# Patient Record
Sex: Female | Born: 1950 | Race: White | Hispanic: No | Marital: Married | Smoking: Former smoker
Health system: Southern US, Community
[De-identification: ages and names within clinical notes are randomized; demographics above are authoritative.]

## PROBLEM LIST (undated history)

## (undated) DIAGNOSIS — C069 Malignant neoplasm of mouth, unspecified: Secondary | ICD-10-CM

## (undated) DIAGNOSIS — M8580 Other specified disorders of bone density and structure, unspecified site: Secondary | ICD-10-CM

## (undated) DIAGNOSIS — R079 Chest pain, unspecified: Secondary | ICD-10-CM

## (undated) DIAGNOSIS — K5120 Ulcerative (chronic) proctitis without complications: Secondary | ICD-10-CM

## (undated) DIAGNOSIS — K219 Gastro-esophageal reflux disease without esophagitis: Secondary | ICD-10-CM

## (undated) DIAGNOSIS — C669 Malignant neoplasm of unspecified ureter: Secondary | ICD-10-CM

## (undated) DIAGNOSIS — M069 Rheumatoid arthritis, unspecified: Secondary | ICD-10-CM

## (undated) DIAGNOSIS — M0520 Rheumatoid vasculitis with rheumatoid arthritis of unspecified site: Secondary | ICD-10-CM

## (undated) DIAGNOSIS — K1379 Other lesions of oral mucosa: Secondary | ICD-10-CM

## (undated) DIAGNOSIS — Z973 Presence of spectacles and contact lenses: Secondary | ICD-10-CM

## (undated) DIAGNOSIS — Z9889 Other specified postprocedural states: Secondary | ICD-10-CM

## (undated) DIAGNOSIS — R112 Nausea with vomiting, unspecified: Secondary | ICD-10-CM

## (undated) HISTORY — PX: TUBAL LIGATION: SHX77

## (undated) HISTORY — PX: COLONOSCOPY: SHX174

## (undated) HISTORY — DX: Chest pain, unspecified: R07.9

---

## 1986-11-01 DIAGNOSIS — R112 Nausea with vomiting, unspecified: Secondary | ICD-10-CM

## 1986-11-01 DIAGNOSIS — Z9889 Other specified postprocedural states: Secondary | ICD-10-CM

## 1986-11-01 HISTORY — DX: Nausea with vomiting, unspecified: R11.2

## 1986-11-01 HISTORY — DX: Other specified postprocedural states: Z98.890

## 2000-05-10 ENCOUNTER — Other Ambulatory Visit: Admission: RE | Admit: 2000-05-10 | Discharge: 2000-05-10 | Payer: Self-pay | Admitting: Obstetrics and Gynecology

## 2000-07-27 ENCOUNTER — Encounter: Payer: Self-pay | Admitting: *Deleted

## 2000-07-27 ENCOUNTER — Encounter: Admission: RE | Admit: 2000-07-27 | Discharge: 2000-07-27 | Payer: Self-pay | Admitting: *Deleted

## 2002-02-19 ENCOUNTER — Other Ambulatory Visit: Admission: RE | Admit: 2002-02-19 | Discharge: 2002-02-19 | Payer: Self-pay | Admitting: Obstetrics and Gynecology

## 2002-07-25 ENCOUNTER — Ambulatory Visit (HOSPITAL_COMMUNITY): Admission: RE | Admit: 2002-07-25 | Discharge: 2002-07-25 | Payer: Self-pay | Admitting: Gastroenterology

## 2002-07-25 ENCOUNTER — Encounter (INDEPENDENT_AMBULATORY_CARE_PROVIDER_SITE_OTHER): Payer: Self-pay | Admitting: Specialist

## 2003-07-12 ENCOUNTER — Other Ambulatory Visit: Admission: RE | Admit: 2003-07-12 | Discharge: 2003-07-12 | Payer: Self-pay | Admitting: Obstetrics and Gynecology

## 2003-08-13 ENCOUNTER — Encounter: Payer: Self-pay | Admitting: Obstetrics and Gynecology

## 2003-08-13 ENCOUNTER — Encounter: Admission: RE | Admit: 2003-08-13 | Discharge: 2003-08-13 | Payer: Self-pay | Admitting: Obstetrics and Gynecology

## 2004-07-15 ENCOUNTER — Other Ambulatory Visit: Admission: RE | Admit: 2004-07-15 | Discharge: 2004-07-15 | Payer: Self-pay | Admitting: Obstetrics and Gynecology

## 2005-11-01 HISTORY — PX: FINGER GANGLION CYST EXCISION: SHX1636

## 2006-02-22 ENCOUNTER — Ambulatory Visit (HOSPITAL_BASED_OUTPATIENT_CLINIC_OR_DEPARTMENT_OTHER): Admission: RE | Admit: 2006-02-22 | Discharge: 2006-02-22 | Payer: Self-pay | Admitting: Orthopedic Surgery

## 2006-07-25 ENCOUNTER — Other Ambulatory Visit: Admission: RE | Admit: 2006-07-25 | Discharge: 2006-07-25 | Payer: Self-pay | Admitting: Obstetrics and Gynecology

## 2007-08-04 ENCOUNTER — Ambulatory Visit (HOSPITAL_COMMUNITY): Admission: RE | Admit: 2007-08-04 | Discharge: 2007-08-04 | Payer: Self-pay | Admitting: Obstetrics and Gynecology

## 2011-01-04 ENCOUNTER — Other Ambulatory Visit: Payer: Self-pay | Admitting: Rheumatology

## 2011-01-04 DIAGNOSIS — M545 Low back pain: Secondary | ICD-10-CM

## 2011-01-04 DIAGNOSIS — M79604 Pain in right leg: Secondary | ICD-10-CM

## 2011-01-07 ENCOUNTER — Other Ambulatory Visit: Payer: Self-pay

## 2011-01-25 ENCOUNTER — Ambulatory Visit
Admission: RE | Admit: 2011-01-25 | Discharge: 2011-01-25 | Disposition: A | Discharge status: Home / Self Care | Payer: Self-pay | Source: Ambulatory Visit | Attending: Rheumatology | Admitting: Rheumatology

## 2011-01-25 DIAGNOSIS — M545 Low back pain: Secondary | ICD-10-CM

## 2011-01-25 DIAGNOSIS — M79604 Pain in right leg: Secondary | ICD-10-CM

## 2011-01-28 ENCOUNTER — Other Ambulatory Visit: Payer: Self-pay | Admitting: Rheumatology

## 2011-01-28 DIAGNOSIS — M5416 Radiculopathy, lumbar region: Secondary | ICD-10-CM

## 2011-02-01 ENCOUNTER — Ambulatory Visit
Admission: RE | Admit: 2011-02-01 | Discharge: 2011-02-01 | Disposition: A | Discharge status: Home / Self Care | Payer: 59 | Source: Ambulatory Visit | Attending: Rheumatology | Admitting: Rheumatology

## 2011-02-01 DIAGNOSIS — M5416 Radiculopathy, lumbar region: Secondary | ICD-10-CM

## 2011-02-17 ENCOUNTER — Other Ambulatory Visit: Payer: Self-pay | Admitting: Rheumatology

## 2011-02-17 DIAGNOSIS — M5416 Radiculopathy, lumbar region: Secondary | ICD-10-CM

## 2011-02-18 ENCOUNTER — Ambulatory Visit
Admission: RE | Admit: 2011-02-18 | Discharge: 2011-02-18 | Disposition: A | Discharge status: Home / Self Care | Payer: 59 | Source: Ambulatory Visit | Attending: Rheumatology | Admitting: Rheumatology

## 2011-02-18 DIAGNOSIS — M5416 Radiculopathy, lumbar region: Secondary | ICD-10-CM

## 2011-03-19 ENCOUNTER — Other Ambulatory Visit: Payer: Self-pay | Admitting: Rheumatology

## 2011-03-19 DIAGNOSIS — M545 Low back pain, unspecified: Secondary | ICD-10-CM

## 2011-03-19 NOTE — Op Note (Signed)
   NAME:  Sandra Mann, Sandra Mann                          ACCOUNT NO.:  [id number]   MEDICAL RECORD NO.:  [id number]                   PATIENT TYPE:  AMB   LOCATION:  ENDO                                 FACILITY:  [facility]   PHYSICIAN:  Juanita Craver, M.D.                   DATE OF BIRTH:  11/14/1950   DATE OF PROCEDURE:  07/25/2002  DATE OF DISCHARGE:                                 OPERATIVE REPORT   PROCEDURE PERFORMED:  Colonoscopy with biopsies.   ENDOSCOPIST:  Juanita Craver, M.D.   INSTRUMENT USED:  Pediatric adjustable Olympus colonoscope.   INDICATIONS OF PROCEDURE:  A 60 year old white female with a history of  guaiac positive stools and severe constipation, rule out colonic polyps,  masses, hemorrhoids, etc.   PREPROCEDURE PREPARATION:  Informed consent was procured from the patient.  The patient was fasted for eight hours prior to the procedure and prepped  with a bottle of magnesium citrate and a gallon of NuLytely the night prior  to the procedure.   PREPROCEDURE PHYSICAL:  The patient had stable vital signs.  Neck supple.  Chest clear to auscultation.  S1 and S2 regular.  Abdomen soft with normal  bowel sounds.   DESCRIPTION OF PROCEDURE:  The patient was placed in the left lateral  decubitus position and sedated with 60 mg of Demerol and 5 mg of Versed  intravenously.  Once the patient was adequately sedated and maintained on  low-flow oxygen and continuous cardiac monitoring, the Olympus video  colonoscope was advanced from the rectum to the cecum with difficulty.  There was severe melanosis coli throughout the colon with more prominent  changes in the right and transverse colon.  A prominent internal hemorrhoid  was seen on retroflexion.  A few small polyps (four, small, sessile polyps)  were biopsied from 10 cm.   IMPRESSION:  1. Severe melanosis coli.  2. Prominent interna hemorrhoid.  3. A few polyps, biopsied from 10 cm.   RECOMMENDATIONS:  1. Await pathology  results.  2. High-fiber diet with liberal fluid intake has been advocated.  3.     Avoid laxative use.  4. Use stool softeners as needed.  5. Outpatient followup in the next two weeks or earlier if need be.                                               Juanita Craver, M.D.    JM/MEDQ  D:  07/25/2002  T:  07/26/2002  Job:  [id number]   cc:   Youlanda Roys. Deatra Ina, M.D.   Rhunette Croft, NP  [facility]

## 2011-03-19 NOTE — Op Note (Signed)
Sandra Mann, Sandra Mann                ACCOUNT NO.:  [id number]   MEDICAL RECORD NO.:  [id number]          PATIENT TYPE:  AMB   LOCATION:  [place]                          FACILITY:  [facility]   PHYSICIAN:  Youlanda Mighty. Sypher, M.D. 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   POSTOPERATIVE DIAGNOSIS:  Painful DIP degenerative arthritis of right small  finger with secondary mucoid cyst formation dorsal aspect of right small  finger nail fold.   OPERATION:  1.  Right small finger DIP joint arthrotomy for debridement and removal of      marginal osteophytes.  2.  Resection mucoid cyst.   OPERATIONS:  Sandra Mann, M.D.   ASSISTANT:  Sandra [place] Dasnoit PA-C.   ANESTHESIA:  0.25% Marcaine and 2% lidocaine metacarpal head level block of  right small finger supplemented by IV sedation, supervising anesthesiologist  Dr. Al Corpus.   INDICATIONS:  Sandra Mann is a 60 year old woman referred for evaluation  and management of painful right small finger DIP joint with a large mucous  cyst measuring more than 6 mm in diameter dorsal nail fold.  She had had  progressive arthritis of the DIP joint with pain and swelling for many  years.  Recently she developed a chronically draining mucoid cyst.  She  presents for evaluation requesting resection of the cyst.   PROCEDURE:  [name] is brought to the operating room and placed supine  position on the operating table.   Following light sedation the right arm was prepped with Betadine soap  solution, sterilely draped.  The right small finger was exsanguinated with a  gauze wrap and a quarter inch Penrose drain was placed over the proximal  phalangeal segment as a digital tourniquet.  The procedure commenced with  a  curvilinear incision exposing extensor mechanism and the dorsal ulnar aspect  of the right small finger DIP joint.   The cyst was circumferentially dissected and removed from inside out with a  rongeur.  There was no obvious sinus tract from the interval between  extensor mechanism and the ulnar collateral ligament.  The sinus tract to  the cyst appeared be exiting directly through the extensor tendon.  A dorsal  ulnar arthrotomy was accomplished by resection of the capsule between the  terminal extensor tendon and the ulnar collateral ligament.  A fine rongeur  was used to remove the capsule followed by debridement of the ulnar base  dorsal osteophyte of the distal phalanx.  The PIP joint was then debrided  with a micro curette and irrigated thoroughly with 27 gauge needle and  sterile saline.  The osteophyte at the middle phalangeal head was also  debrided with the micro rongeur and a micro curette.   Bleeding points were electrocauterized bipolar current followed by repair  the skin with interrupted suture of 5-0 nylon.   There were  no operative complications.  [name] tolerated surgery and  anesthesia well.  She was placed in a compressive dressing of Xeroflo  sterile gauze and Coban awake from sedation and transferred to recovery with  stable signs.      Youlanda Mighty Sypher, M.D.  Electronically Signed     RVS/MEDQ  D:  02/22/2006  T:  02/23/2006  Job:  [id number]

## 2011-03-22 ENCOUNTER — Ambulatory Visit
Admission: RE | Admit: 2011-03-22 | Discharge: 2011-03-22 | Disposition: A | Discharge status: Home / Self Care | Payer: 59 | Source: Ambulatory Visit | Attending: Rheumatology | Admitting: Rheumatology

## 2011-03-22 ENCOUNTER — Other Ambulatory Visit: Payer: Self-pay | Admitting: Rheumatology

## 2011-03-22 DIAGNOSIS — M545 Low back pain, unspecified: Secondary | ICD-10-CM

## 2011-12-28 ENCOUNTER — Ambulatory Visit (INDEPENDENT_AMBULATORY_CARE_PROVIDER_SITE_OTHER): Payer: 59 | Admitting: Obstetrics and Gynecology

## 2011-12-28 DIAGNOSIS — Z01419 Encounter for gynecological examination (general) (routine) without abnormal findings: Secondary | ICD-10-CM

## 2012-01-25 ENCOUNTER — Encounter (INDEPENDENT_AMBULATORY_CARE_PROVIDER_SITE_OTHER): Payer: 59

## 2012-01-25 ENCOUNTER — Encounter: Payer: 59 | Admitting: Obstetrics and Gynecology

## 2012-01-25 ENCOUNTER — Encounter (INDEPENDENT_AMBULATORY_CARE_PROVIDER_SITE_OTHER): Payer: 59 | Admitting: Obstetrics and Gynecology

## 2012-01-25 DIAGNOSIS — R87610 Atypical squamous cells of undetermined significance on cytologic smear of cervix (ASC-US): Secondary | ICD-10-CM

## 2012-01-27 ENCOUNTER — Other Ambulatory Visit: Payer: Self-pay | Admitting: Dermatology

## 2012-02-07 ENCOUNTER — Other Ambulatory Visit: Payer: Self-pay

## 2012-02-16 ENCOUNTER — Other Ambulatory Visit: Payer: Self-pay | Admitting: Obstetrics and Gynecology

## 2012-02-16 NOTE — Telephone Encounter (Signed)
Rx Folix Acid 33m 1 poqd #90 refill 1 yr called to [facility].  Pt stated she had not recvd rx yet by me faxing so I went ahead and called it in.  Pt agreeable.  ld

## 2012-02-16 NOTE — Telephone Encounter (Signed)
Routed to Sandra Mann

## 2012-02-28 ENCOUNTER — Other Ambulatory Visit: Payer: 59

## 2012-02-29 ENCOUNTER — Other Ambulatory Visit: Payer: 59

## 2012-03-03 ENCOUNTER — Other Ambulatory Visit: Payer: Self-pay | Admitting: Obstetrics and Gynecology

## 2012-03-06 ENCOUNTER — Ambulatory Visit: Payer: 59

## 2012-03-20 ENCOUNTER — Other Ambulatory Visit: Payer: Self-pay | Admitting: Obstetrics and Gynecology

## 2012-03-20 ENCOUNTER — Other Ambulatory Visit (INDEPENDENT_AMBULATORY_CARE_PROVIDER_SITE_OTHER): Payer: 59

## 2012-03-20 DIAGNOSIS — Z13820 Encounter for screening for osteoporosis: Secondary | ICD-10-CM

## 2012-03-23 ENCOUNTER — Emergency Department
Admission: EM | Admit: 2012-03-23 | Discharge: 2012-03-23 | Discharge status: Absent without leave | Payer: Self-pay | Source: Home / Self Care

## 2014-01-30 DIAGNOSIS — C069 Malignant neoplasm of mouth, unspecified: Secondary | ICD-10-CM

## 2014-01-30 HISTORY — DX: Malignant neoplasm of mouth, unspecified: C06.9

## 2014-02-20 ENCOUNTER — Other Ambulatory Visit: Payer: Self-pay | Admitting: Oral Surgery

## 2014-03-20 ENCOUNTER — Encounter (HOSPITAL_BASED_OUTPATIENT_CLINIC_OR_DEPARTMENT_OTHER): Payer: Self-pay | Admitting: *Deleted

## 2014-03-21 NOTE — Consult Note (Signed)
  This is a 63 y/o wd/wn white female who presented with an 8 month history of an ulcer on the left muccobuccal fold adjacent to teeth #18, and 19.  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  The definitive treatment is to surgical excise the area.  At least two teeth (#18,19) will be removed at the same time.

## 2014-03-27 ENCOUNTER — Encounter (HOSPITAL_BASED_OUTPATIENT_CLINIC_OR_DEPARTMENT_OTHER)
Admission: RE | Disposition: A | Discharge status: Home / Self Care | Payer: Self-pay | Source: Ambulatory Visit | Attending: Oral Surgery

## 2014-03-27 ENCOUNTER — Encounter (HOSPITAL_BASED_OUTPATIENT_CLINIC_OR_DEPARTMENT_OTHER): Payer: Self-pay | Admitting: Anesthesiology

## 2014-03-27 ENCOUNTER — Encounter (HOSPITAL_BASED_OUTPATIENT_CLINIC_OR_DEPARTMENT_OTHER): Payer: 59 | Admitting: Anesthesiology

## 2014-03-27 ENCOUNTER — Ambulatory Visit (HOSPITAL_BASED_OUTPATIENT_CLINIC_OR_DEPARTMENT_OTHER)
Admission: RE | Admit: 2014-03-27 | Discharge: 2014-03-27 | Disposition: A | Discharge status: Home / Self Care | Payer: 59 | Source: Ambulatory Visit | Attending: Oral Surgery | Admitting: Oral Surgery

## 2014-03-27 ENCOUNTER — Ambulatory Visit (HOSPITAL_BASED_OUTPATIENT_CLINIC_OR_DEPARTMENT_OTHER): Payer: 59 | Admitting: Anesthesiology

## 2014-03-27 DIAGNOSIS — M069 Rheumatoid arthritis, unspecified: Secondary | ICD-10-CM | POA: Insufficient documentation

## 2014-03-27 DIAGNOSIS — Z87891 Personal history of nicotine dependence: Secondary | ICD-10-CM | POA: Insufficient documentation

## 2014-03-27 DIAGNOSIS — D0000 Carcinoma in situ of oral cavity, unspecified site: Secondary | ICD-10-CM | POA: Insufficient documentation

## 2014-03-27 DIAGNOSIS — C069 Malignant neoplasm of mouth, unspecified: Secondary | ICD-10-CM

## 2014-03-27 DIAGNOSIS — D0008 Carcinoma in situ of pharynx: Secondary | ICD-10-CM

## 2014-03-27 DIAGNOSIS — D098 Carcinoma in situ of other specified sites: Secondary | ICD-10-CM | POA: Insufficient documentation

## 2014-03-27 DIAGNOSIS — K219 Gastro-esophageal reflux disease without esophagitis: Secondary | ICD-10-CM | POA: Insufficient documentation

## 2014-03-27 DIAGNOSIS — Z79899 Other long term (current) drug therapy: Secondary | ICD-10-CM | POA: Insufficient documentation

## 2014-03-27 DIAGNOSIS — D0001 Carcinoma in situ of labial mucosa and vermilion border: Secondary | ICD-10-CM

## 2014-03-27 HISTORY — DX: Other lesions of oral mucosa: K13.79

## 2014-03-27 HISTORY — DX: Gastro-esophageal reflux disease without esophagitis: K21.9

## 2014-03-27 HISTORY — PX: MULTIPLE EXTRACTIONS WITH ALVEOLOPLASTY: SHX5342

## 2014-03-27 HISTORY — DX: Presence of spectacles and contact lenses: Z97.3

## 2014-03-27 HISTORY — DX: Rheumatoid vasculitis with rheumatoid arthritis of unspecified site: M05.20

## 2014-03-27 HISTORY — PX: EXCISION ORAL TUMOR: SHX6265

## 2014-03-27 LAB — POCT HEMOGLOBIN-HEMACUE: Hemoglobin: 13.7 g/dL (ref 12.0–15.0)

## 2014-03-27 SURGERY — MULTIPLE EXTRACTION WITH ALVEOLOPLASTY
Anesthesia: General | Site: Mouth | Laterality: Left

## 2014-03-27 MED ORDER — CLINDAMYCIN PHOSPHATE 600 MG/50ML IV SOLN
INTRAVENOUS | Status: DC | PRN
  Administered 2014-03-27: 600 mg via INTRAVENOUS

## 2014-03-27 MED ORDER — FENTANYL CITRATE 0.05 MG/ML IJ SOLN
25.0000 ug | INTRAMUSCULAR | Status: DC | PRN
  Administered 2014-03-27 (×2): 25 ug via INTRAVENOUS

## 2014-03-27 MED ORDER — HYDROCODONE-ACETAMINOPHEN 5-325 MG PO TABS
ORAL_TABLET | ORAL | Status: AC
  Filled 2014-03-27: qty 1

## 2014-03-27 MED ORDER — LACTATED RINGERS IV SOLN
INTRAVENOUS | Status: DC
  Administered 2014-03-27 (×2): via INTRAVENOUS

## 2014-03-27 MED ORDER — LIDOCAINE-EPINEPHRINE 1 %-1:100000 IJ SOLN
INTRAMUSCULAR | Status: AC
  Filled 2014-03-27: qty 1

## 2014-03-27 MED ORDER — CLINDAMYCIN PHOSPHATE 600 MG/50ML IV SOLN
INTRAVENOUS | Status: AC
  Filled 2014-03-27: qty 50

## 2014-03-27 MED ORDER — CLINDAMYCIN HCL 300 MG PO CAPS: 300.0000 mg | ORAL_CAPSULE | Freq: Three times a day (TID) | ORAL | Status: DC

## 2014-03-27 MED ORDER — LIDOCAINE-EPINEPHRINE 2 %-1:100000 IJ SOLN
INTRAMUSCULAR | Status: DC | PRN
  Administered 2014-03-27: 3.4 mL via INTRADERMAL

## 2014-03-27 MED ORDER — FENTANYL CITRATE 0.05 MG/ML IJ SOLN: 50.0000 ug | INTRAMUSCULAR | Status: DC | PRN

## 2014-03-27 MED ORDER — SUCCINYLCHOLINE CHLORIDE 20 MG/ML IJ SOLN
INTRAMUSCULAR | Status: DC | PRN
  Administered 2014-03-27: 100 mg via INTRAVENOUS

## 2014-03-27 MED ORDER — LIDOCAINE HCL (CARDIAC) 20 MG/ML IV SOLN
INTRAVENOUS | Status: DC | PRN
Start: 2014-03-27 — End: 2014-03-27
  Administered 2014-03-27: 50 mg via INTRAVENOUS

## 2014-03-27 MED ORDER — BUPIVACAINE-EPINEPHRINE (PF) 0.5% -1:200000 IJ SOLN
INTRAMUSCULAR | Status: AC
  Filled 2014-03-27: qty 30

## 2014-03-27 MED ORDER — OXYMETAZOLINE HCL 0.05 % NA SOLN
NASAL | Status: AC
  Filled 2014-03-27: qty 15

## 2014-03-27 MED ORDER — MIDAZOLAM HCL 2 MG/2ML IJ SOLN: 1.0000 mg | INTRAMUSCULAR | Status: DC | PRN

## 2014-03-27 MED ORDER — MIDAZOLAM HCL 5 MG/5ML IJ SOLN
INTRAMUSCULAR | Status: DC | PRN
  Administered 2014-03-27: 2 mg via INTRAVENOUS

## 2014-03-27 MED ORDER — PROMETHAZINE HCL 25 MG RE SUPP: 25.0000 mg | Freq: Four times a day (QID) | RECTAL | Status: DC | PRN

## 2014-03-27 MED ORDER — FENTANYL CITRATE 0.05 MG/ML IJ SOLN
INTRAMUSCULAR | Status: DC | PRN
  Administered 2014-03-27: 100 ug via INTRAVENOUS

## 2014-03-27 MED ORDER — ONDANSETRON HCL 4 MG/2ML IJ SOLN: 4.0000 mg | Freq: Four times a day (QID) | INTRAMUSCULAR | Status: DC | PRN

## 2014-03-27 MED ORDER — OXYMETAZOLINE HCL 0.05 % NA SOLN
NASAL | Status: DC | PRN
  Administered 2014-03-27: 3 via NASAL

## 2014-03-27 MED ORDER — OXYCODONE HCL 5 MG PO TABS: 5.0000 mg | ORAL_TABLET | Freq: Once | ORAL | Status: DC | PRN

## 2014-03-27 MED ORDER — FENTANYL CITRATE 0.05 MG/ML IJ SOLN
INTRAMUSCULAR | Status: AC
  Filled 2014-03-27: qty 6

## 2014-03-27 MED ORDER — ONDANSETRON HCL 4 MG/2ML IJ SOLN
INTRAMUSCULAR | Status: DC | PRN
Start: 2014-03-27 — End: 2014-03-27
  Administered 2014-03-27: 4 mg via INTRAVENOUS

## 2014-03-27 MED ORDER — BACITRACIN-NEOMYCIN-POLYMYXIN 400-5-5000 EX OINT
TOPICAL_OINTMENT | CUTANEOUS | Status: AC
  Filled 2014-03-27: qty 1

## 2014-03-27 MED ORDER — LIDOCAINE-EPINEPHRINE 2 %-1:100000 IJ SOLN
INTRAMUSCULAR | Status: AC
  Filled 2014-03-27: qty 1.7

## 2014-03-27 MED ORDER — OXYCODONE HCL 5 MG/5ML PO SOLN: 5.0000 mg | Freq: Once | ORAL | Status: DC | PRN

## 2014-03-27 MED ORDER — SODIUM CHLORIDE 0.9 % IV SOLN
INTRAVENOUS | Status: DC | PRN
  Administered 2014-03-27: 100 mL via INTRAMUSCULAR

## 2014-03-27 MED ORDER — DEXAMETHASONE SODIUM PHOSPHATE 4 MG/ML IJ SOLN
INTRAMUSCULAR | Status: DC | PRN
  Administered 2014-03-27: 10 mg via INTRAVENOUS

## 2014-03-27 MED ORDER — LIDOCAINE-EPINEPHRINE 2 %-1:100000 IJ SOLN
INTRAMUSCULAR | Status: AC
  Filled 2014-03-27: qty 3.4

## 2014-03-27 MED ORDER — HYDROCODONE-ACETAMINOPHEN 7.5-325 MG PO TABS: 1.0000 | ORAL_TABLET | Freq: Four times a day (QID) | ORAL | Status: DC | PRN

## 2014-03-27 MED ORDER — HYDROCODONE-ACETAMINOPHEN 5-325 MG PO TABS
1.0000 | ORAL_TABLET | Freq: Once | ORAL | Status: AC | PRN
  Administered 2014-03-27: 1 via ORAL

## 2014-03-27 MED ORDER — MIDAZOLAM HCL 2 MG/2ML IJ SOLN
INTRAMUSCULAR | Status: AC
  Filled 2014-03-27: qty 2

## 2014-03-27 MED ORDER — PROPOFOL 10 MG/ML IV BOLUS
INTRAVENOUS | Status: DC | PRN
  Administered 2014-03-27: 200 mg via INTRAVENOUS

## 2014-03-27 MED ORDER — FENTANYL CITRATE 0.05 MG/ML IJ SOLN
INTRAMUSCULAR | Status: AC
  Filled 2014-03-27: qty 2

## 2014-03-27 SURGICAL SUPPLY — 66 items
BLADE 15 SAFETY STRL DISP (BLADE) ×4 IMPLANT
BUR EGG 3PK/BX (BURR) IMPLANT
BUR FISSURE CARBIDE (BURR) IMPLANT
BUR OVAL 4.0X59 (BURR) ×2 IMPLANT
BUR ROUND CARBIDE (BURR) IMPLANT
BUR SIDE CUT (BURR) IMPLANT
BUR SIDE CUT 44.8 STRL (BURR) IMPLANT
BURR SIDE CUT 44.8 STRL (BURR)
CANISTER SUCT 1200ML W/VALVE (MISCELLANEOUS) ×4 IMPLANT
CATH ROBINSON RED A/P 10FR (CATHETERS) IMPLANT
CATH ROBINSON RED A/P 12FR (CATHETERS) ×2 IMPLANT
CLEANER CAUTERY TIP 5X5 PAD (MISCELLANEOUS) ×1 IMPLANT
COAGULATOR SUCT SWTCH 10FR 6 (ELECTROSURGICAL) IMPLANT
COVER MAYO STAND STRL (DRAPES) ×4 IMPLANT
COVER TABLE BACK 60X90 (DRAPES) ×2 IMPLANT
DRAPE U-SHAPE 76X120 STRL (DRAPES) ×2 IMPLANT
ELECT COATED BLADE 2.86 ST (ELECTRODE) ×2 IMPLANT
ELECT REM PT RETURN 9FT ADLT (ELECTROSURGICAL)
ELECTRODE REM PT RTRN 9FT ADLT (ELECTROSURGICAL) IMPLANT
GAUZE PACKING IODOFORM 1/4X15 (GAUZE/BANDAGES/DRESSINGS) IMPLANT
GAUZE SPONGE 4X4 12PLY STRL (GAUZE/BANDAGES/DRESSINGS) ×2 IMPLANT
GLOVE BIO SURGEON STRL SZ 6.5 (GLOVE) ×4 IMPLANT
GLOVE BIO SURGEON STRL SZ7.5 (GLOVE) ×2 IMPLANT
GLOVE BIOGEL M STRL SZ7.5 (GLOVE) ×4 IMPLANT
GLOVE BIOGEL PI IND STRL 8 (GLOVE) ×2 IMPLANT
GLOVE BIOGEL PI INDICATOR 8 (GLOVE) ×2
GLOVE ECLIPSE 7.5 STRL STRAW (GLOVE) ×2 IMPLANT
GLOVE EXAM NITRILE MD LF STRL (GLOVE) ×2 IMPLANT
GOWN STRL REUS W/ TWL LRG LVL3 (GOWN DISPOSABLE) ×4 IMPLANT
GOWN STRL REUS W/ TWL XL LVL3 (GOWN DISPOSABLE) ×2 IMPLANT
GOWN STRL REUS W/TWL LRG LVL3 (GOWN DISPOSABLE) ×4
GOWN STRL REUS W/TWL XL LVL3 (GOWN DISPOSABLE) ×2
HEMOSTAT SNOW SURGICEL 2X4 (HEMOSTASIS) IMPLANT
HEMOSTAT SURGICEL .5X2 ABSORB (HEMOSTASIS) IMPLANT
HEMOSTAT SURGICEL 2X14 (HEMOSTASIS) IMPLANT
IV NS 500ML (IV SOLUTION)
IV NS 500ML BAXH (IV SOLUTION) IMPLANT
MARKER SKIN DUAL TIP RULER LAB (MISCELLANEOUS) IMPLANT
NEEDLE 27GAX1X1/2 (NEEDLE) ×2 IMPLANT
NEEDLE DENTAL 27 LONG (NEEDLE) ×2 IMPLANT
NS IRRIG 1000ML POUR BTL (IV SOLUTION) ×4 IMPLANT
PACK BASIN DAY SURGERY FS (CUSTOM PROCEDURE TRAY) ×4 IMPLANT
PAD CLEANER CAUTERY TIP 5X5 (MISCELLANEOUS) ×1
PATTIES SURGICAL .5 X3 (DISPOSABLE) IMPLANT
PENCIL FOOT CONTROL (ELECTRODE) ×2 IMPLANT
SHEET MEDIUM DRAPE 40X70 STRL (DRAPES) ×2 IMPLANT
SLEEVE SCD COMPRESS KNEE MED (MISCELLANEOUS) ×2 IMPLANT
SOLUTION BUTLER CLEAR DIP (MISCELLANEOUS) IMPLANT
SPONGE SURGIFOAM ABS GEL 12-7 (HEMOSTASIS) IMPLANT
SPONGE TONSIL 1 RF SGL (DISPOSABLE) IMPLANT
SPONGE TONSIL 1.25 RF SGL STRG (GAUZE/BANDAGES/DRESSINGS) IMPLANT
SUT BONE WAX W31G (SUTURE) ×2 IMPLANT
SUT CHROMIC 3 0 PS 2 (SUTURE) IMPLANT
SUT CHROMIC 4 0 P 3 18 (SUTURE) IMPLANT
SUT SILK 3 0 PS 1 (SUTURE) ×4 IMPLANT
SUT VIC AB 3-0 SH 27 (SUTURE) ×1
SUT VIC AB 3-0 SH 27X BRD (SUTURE) ×1 IMPLANT
SYR 50ML LL SCALE MARK (SYRINGE) ×4 IMPLANT
SYR BULB 3OZ (MISCELLANEOUS) ×2 IMPLANT
SYR CONTROL 10ML LL (SYRINGE) IMPLANT
TOWEL OR 17X24 6PK STRL BLUE (TOWEL DISPOSABLE) ×6 IMPLANT
TOWEL OR NON WOVEN STRL DISP B (DISPOSABLE) ×2 IMPLANT
TUBE CONNECTING 20X1/4 (TUBING) ×4 IMPLANT
TUBE SALEM SUMP 16 FR W/ARV (TUBING) IMPLANT
VENT IRR SPI W TUB AD (MISCELLANEOUS) IMPLANT
YANKAUER SUCT BULB TIP NO VENT (SUCTIONS) ×2 IMPLANT

## 2014-03-27 NOTE — Transfer of Care (Signed)
Immediate Anesthesia Transfer of Care Note  Patient: Sandra Mann  Procedure(s) Performed: Procedure(s): EXTRACTIONS OF NECESSARY TOOTH  (Left) EXCISION OF ORAL CANCER (Left)  Patient Location: PACU  Anesthesia Type:General  Level of Consciousness: awake  Airway & Oxygen Therapy: Patient Spontanous Breathing and Patient connected to face mask oxygen  Post-op Assessment: Report given to PACU RN and Post -op Vital signs reviewed and stable  Post vital signs: Reviewed and stable  Complications: No apparent anesthesia complications

## 2014-03-27 NOTE — Anesthesia Procedure Notes (Signed)
Procedure Name: Intubation Date/Time: 03/27/2014 8:43 AM Performed by: Lieutenant Diego Pre-anesthesia Checklist: Patient identified, Emergency Drugs available, Suction available and Patient being monitored Patient Re-evaluated:Patient Re-evaluated prior to inductionOxygen Delivery Method: Circle System Utilized Preoxygenation: Pre-oxygenation with 100% oxygen Intubation Type: IV induction Ventilation: Mask ventilation without difficulty Laryngoscope Size: Miller and 2 Grade View: Grade I Nasal Tubes: Nasal prep performed, Nasal Rae and Left Tube size: 7.0 mm Number of attempts: 1 Placement Confirmation: ETT inserted through vocal cords under direct vision,  positive ETCO2 and breath sounds checked- equal and bilateral Secured at: 22 cm Tube secured with: Tape Dental Injury: Teeth and Oropharynx as per pre-operative assessment

## 2014-03-27 NOTE — H&P (Signed)
Sandra Mann is an 63 y.o. female.   Chief Complaint: Oral cancer  HPI: Oral lesion biopsied, carcinoma in situ at least.  Past Medical History  Diagnosis Date  . GERD (gastroesophageal reflux disease)   . Arthritis   . Rheumatoid arteritis   . Wears glasses   . Oral mass     left    Past Surgical History  Procedure Laterality Date  . Colonoscopy    . Tubal ligation    . Finger ganglion cyst excision  2007    right small dip    History reviewed. No pertinent family history. Social History:  reports that she quit smoking about 14 years ago. She does not have any smokeless tobacco history on file. She reports that she drinks alcohol. She reports that she does not use illicit drugs.  Allergies:  Allergies  Allergen Reactions  . Sulfa Antibiotics Nausea And Vomiting  . Codeine Nausea And Vomiting    Medications Prior to Admission  Medication Sig Dispense Refill  . cycloSPORINE (RESTASIS) 0.05 % ophthalmic emulsion Place 1 drop into both eyes 2 (two) times daily.      Marland Kitchen dexlansoprazole (DEXILANT) 60 MG capsule Take 60 mg by mouth daily.      . folic acid (FOLVITE) 1 MG tablet Take 1 mg by mouth daily.      . mesalamine (CANASA) 1000 MG suppository Place 1,000 mg rectally at bedtime.      . methotrexate (RHEUMATREX) 2.5 MG tablet Take 200 mg by mouth once a week. #8 tabs weekly        Results for orders placed during the hospital encounter of 03/27/14 (from the past 48 hour(s))  POCT HEMOGLOBIN-HEMACUE     Status: None   Collection Time    03/27/14  7:57 AM      Result Value Ref Range   Hemoglobin 13.7  12.0 - 15.0 g/dL   No results found.  ROS: otherwise negative  Blood pressure 117/70, pulse 64, temperature 97.7 F (36.5 C), temperature source Oral, resp. rate 20, height 5' 3.5" (1.613 m), weight 127 lb (57.607 kg), SpO2 100.00%.  PHYSICAL EXAM: Overall appearance:  Healthy appearing, in no distress Head:  Normocephalic, atraumatic. Ears: External auditory  canals are clear; tympanic membranes are intact and the middle ears are free of any effusion. Nose: External nose is healthy in appearance. Internal nasal exam free of any lesions or obstruction. Oral Cavity/pharynx:  Small residual lesion along the lateral aspect of the posterior left mandibular tooth.. Neuro:  No identifiable neurologic deficits. Neck: No palpable neck masses.  Studies Reviewed: none    Assessment/Plan Re-excision oral cancer, 2 tooth extraction with Dr. Sabra Heck.  Izora Gala 03/27/2014, 8:20 AM

## 2014-03-27 NOTE — Brief Op Note (Signed)
03/27/2014  9:10 AM  PATIENT:  Wenda Low  63 y.o. female  PRE-OPERATIVE DIAGNOSIS:  CANCER OF LEFT MANDIBLE   POST-OPERATIVE DIAGNOSIS:  * No post-op diagnosis entered *  PROCEDURE:  Procedure(s): EXTRACTIONS OF NECESSARY TOOTH  (N/A) EXCISION OF ORAL CANCER (N/A)  SURGEON:  Surgeon(s) and Role: Panel 1:    * Ceasar Mons, DDS - Primary  Panel 2:    * Izora Gala, MD - Primary  PHYSICIAN ASSISTANT:   ASSISTANTS: Aileen Pilot   ANESTHESIA:   general  EBL:  Total I/O In: 31 [I.V.:800] Out: -   BLOOD ADMINISTERED:none  DRAINS: none   LOCAL MEDICATIONS USED:  XYLOCAINE   SPECIMEN:  Source of Specimen:  Teeth left mandible  DISPOSITION OF SPECIMEN:  PATHOLOGY  COUNTS:  YES  TOURNIQUET:  * No tourniquets in log *  DICTATION: .Dragon Dictation  PLAN OF CARE: Discharge to home after PACU  PATIENT DISPOSITION:  PACU - hemodynamically stable.   Delay start of Pharmacological VTE agent (>24hrs) due to surgical blood loss or risk of bleeding: not applicable

## 2014-03-27 NOTE — Op Note (Signed)
OPERATIVE REPORT  DATE OF SURGERY: 03/27/2014  PATIENT:  Sandra Mann,  63 y.o. female  PRE-OPERATIVE DIAGNOSIS:  CANCER OF LEFT MANDIBLE   POST-OPERATIVE DIAGNOSIS:  CANCER OF LEFT MANDIBLE  PROCEDURE:  Procedure(s): EXTRACTIONS OF NECESSARY TOOTH  EXCISION OF ORAL CANCER  SURGEON:  Beckie Salts, MD  ASSISTANTS: XXX   ANESTHESIA:   General   EBL:  50 ml  DRAINS: none  LOCAL MEDICATIONS USED:  2% Xylocaine with epinephrine  SPECIMEN:  Left posterior mandibular alveolar resection, sutures mark buccal and posterior margins.  COUNTS:  Correct  PROCEDURE DETAILS: The patient was taken to the operating room and placed on the operating table in the supine position. Following induction of general endotracheal anesthesia using a nasal intubation, the oral surgery procedure was performed by Dr. Sabra Heck. Following that, a throat pack was still in place. The dental defect was identified. The site of the prior biopsy was identified as well. Electrocautery was used to create mucosal marked for the proposed margin excisions. The entire mucosal defect was approximately ovoid in shape and measured 2 cm x 0.5 cm, the largest dimension being anterior to posterior. Electrocautery was used to incise all along the proposed incision except for the  lingual side. The soft tissue dissection was completed using electrocautery all the way down to the mandibular bone. A sidecutting bur was used with electric drill to perform the mandibular cuts. Approximately 1 cm of the mandible remained after the bony cuts were produced. The remaining soft tissue lingual side dissection was accomplished using cautery as well. The specimen was oriented and sent for pathologic evaluation. Grossly, the margins appeared well clear of the previous biopsy. A a football-shaped large bur was then used to smooth down the alveolar bone cut edges. The mucosal defect was reapproximated from the buckle to lingual using a 3-0 Vicryl suture  in a running fashion. A single silk tie was placed around a vessel in the surgical bed prior to closure. The patient was awakened from anesthesia, extubated and transferred to recovery in stable condition.    PATIENT DISPOSITION:  To PACU, stable

## 2014-03-27 NOTE — H&P (Signed)
Sandra Mann is an 63 y.o. female.   Chief Complaint: cancer left mandible HPI: ulcer present at least 8 months  Past Medical History  Diagnosis Date  . GERD (gastroesophageal reflux disease)   . Arthritis   . Rheumatoid arteritis   . Wears glasses   . Oral mass     left    Past Surgical History  Procedure Laterality Date  . Colonoscopy    . Tubal ligation    . Finger ganglion cyst excision  2007    right small dip    History reviewed. No pertinent family history. Social History:  reports that she quit smoking about 14 years ago. She does not have any smokeless tobacco history on file. She reports that she drinks alcohol. She reports that she does not use illicit drugs.  Allergies:  Allergies  Allergen Reactions  . Sulfa Antibiotics Nausea And Vomiting  . Codeine Nausea And Vomiting    Medications Prior to Admission  Medication Sig Dispense Refill  . cycloSPORINE (RESTASIS) 0.05 % ophthalmic emulsion Place 1 drop into both eyes 2 (two) times daily.      Marland Kitchen dexlansoprazole (DEXILANT) 60 MG capsule Take 60 mg by mouth daily.      . folic acid (FOLVITE) 1 MG tablet Take 1 mg by mouth daily.      . mesalamine (CANASA) 1000 MG suppository Place 1,000 mg rectally at bedtime.      . methotrexate (RHEUMATREX) 2.5 MG tablet Take 200 mg by mouth once a week. #8 tabs weekly        Results for orders placed during the hospital encounter of 03/27/14 (from the past 48 hour(s))  POCT HEMOGLOBIN-HEMACUE     Status: None   Collection Time    03/27/14  7:57 AM      Result Value Ref Range   Hemoglobin 13.7  12.0 - 15.0 g/dL   No results found.  ROS  Blood pressure 117/70, pulse 64, temperature 97.7 F (36.5 C), temperature source Oral, resp. rate 20, height 5' 3.5" (1.613 m), weight 57.607 kg (127 lb), SpO2 100.00%. Physical Exam  HENT:  Head: Normocephalic.  Mouth/Throat: Uvula is midline and oropharynx is clear and moist.       Assessment/Plan Squamous cell carcinoma  left mandible,  Resection of area and extraction of two teeth #19,20  Ceasar Mons 03/27/2014, 8:11 AM

## 2014-03-27 NOTE — Anesthesia Postprocedure Evaluation (Signed)
Anesthesia Post Note  Patient: Sandra Mann  Procedure(s) Performed: Procedure(s) (LRB): EXTRACTIONS OF NECESSARY TOOTH  (Left) EXCISION OF ORAL CANCER (Left)  Anesthesia type: General  Patient location: PACU  Post pain: Pain level controlled and Adequate analgesia  Post assessment: Post-op Vital signs reviewed, Patient's Cardiovascular Status Stable, Respiratory Function Stable, Patent Airway and Pain level controlled  Last Vitals:  Filed Vitals:   03/27/14 1030  BP: 143/62  Pulse: 67  Temp:   Resp: 15    Post vital signs: Reviewed and stable  Level of consciousness: awake, alert  and oriented  Complications: No apparent anesthesia complications

## 2014-03-27 NOTE — Op Note (Signed)
This is a 63 year old well-developed well-nourished white female who is brought to the operating room and placed in the supine position and which remained throughout the whole procedure. She was intubated via right nasoendotracheal tube and prepped and draped in usual fashion for an intraoral procedure. The throat was suctioned out and a moist open 4 x 4 cause was packed around the endotracheal tube. A #15 blade made an incision around teeth #19 and #20. A full-thickness buccal flap was elevated with a periosteal elevator. The 2 teeth in question were mobilized with an 11-A. elevator. #19 was removed with a #23 forceps. The root tips were removed with a twist elevator. The sockets were curetted and irrigated. #20 was removed with a lower universal forceps and the socket was aggressively curetted. The area was irrigated. The patient was then turned over to Dr. Constance Holster to finish the resection for the cancer.

## 2014-03-27 NOTE — Discharge Instructions (Signed)
Avoid chewing on the left side.  Keep gauze in the left side as much as possible for the rest of the day.  Brush teeth as you normally would. Rinse mouth with saltwater 3 times daily.   Post Anesthesia Home Care Instructions  Activity: Get plenty of rest for the remainder of the day. A responsible adult should stay with you for 24 hours following the procedure.  For the next 24 hours, DO NOT: -Drive a car -Paediatric nurse -Drink alcoholic beverages -Take any medication unless instructed by your physician -Make any legal decisions or sign important papers.  Meals: Start with liquid foods such as gelatin or soup. Progress to regular foods as tolerated. Avoid greasy, spicy, heavy foods. If nausea and/or vomiting occur, drink only clear liquids until the nausea and/or vomiting subsides. Call your physician if vomiting continues.  Special Instructions/Symptoms: Your throat may feel dry or sore from the anesthesia or the breathing tube placed in your throat during surgery. If this causes discomfort, gargle with warm salt water. The discomfort should disappear within 24 hours.

## 2014-03-27 NOTE — Anesthesia Preprocedure Evaluation (Signed)
Anesthesia Evaluation  Patient identified by MRN, date of birth, ID band Patient awake    Reviewed: Allergy & Precautions, H&P , NPO status , Patient's Chart, lab work & pertinent test results  Airway Mallampati: II  Neck ROM: full    Dental   Pulmonary former smoker,          Cardiovascular negative cardio ROS      Neuro/Psych    GI/Hepatic GERD-  ,Oral CA.   Endo/Other    Renal/GU      Musculoskeletal  (+) Arthritis -, Rheumatoid disorders,    Abdominal   Peds  Hematology   Anesthesia Other Findings   Reproductive/Obstetrics                           Anesthesia Physical Anesthesia Plan  ASA: II  Anesthesia Plan: General   Post-op Pain Management:    Induction: Intravenous  Airway Management Planned: Oral ETT  Additional Equipment:   Intra-op Plan:   Post-operative Plan: Extubation in OR  Informed Consent: I have reviewed the patients History and Physical, chart, labs and discussed the procedure including the risks, benefits and alternatives for the proposed anesthesia with the patient or authorized representative who has indicated his/her understanding and acceptance.     Plan Discussed with: CRNA, Anesthesiologist and Surgeon  Anesthesia Plan Comments:         Anesthesia Quick Evaluation

## 2014-03-27 NOTE — H&P (Signed)
  Reviewed surgery with patient and family including teeth #'s to be extracted.  No change in medical history.

## 2014-03-28 ENCOUNTER — Encounter (HOSPITAL_BASED_OUTPATIENT_CLINIC_OR_DEPARTMENT_OTHER): Payer: Self-pay | Admitting: Oral Surgery

## 2014-05-28 ENCOUNTER — Encounter (HOSPITAL_COMMUNITY): Payer: Self-pay | Admitting: General Practice

## 2014-05-28 ENCOUNTER — Inpatient Hospital Stay (HOSPITAL_COMMUNITY)
Admission: AD | Admit: 2014-05-28 | Discharge: 2014-06-03 | DRG: 858 | Disposition: A | Discharge status: Home / Self Care | Payer: 59 | Source: Ambulatory Visit | Attending: Otolaryngology | Admitting: Otolaryngology

## 2014-05-28 DIAGNOSIS — M272 Inflammatory conditions of jaws: Secondary | ICD-10-CM | POA: Diagnosis present

## 2014-05-28 DIAGNOSIS — T8140XA Infection following a procedure, unspecified, initial encounter: Principal | ICD-10-CM | POA: Diagnosis present

## 2014-05-28 DIAGNOSIS — K219 Gastro-esophageal reflux disease without esophagitis: Secondary | ICD-10-CM | POA: Diagnosis present

## 2014-05-28 DIAGNOSIS — Z87891 Personal history of nicotine dependence: Secondary | ICD-10-CM

## 2014-05-28 DIAGNOSIS — Z85819 Personal history of malignant neoplasm of unspecified site of lip, oral cavity, and pharynx: Secondary | ICD-10-CM

## 2014-05-28 DIAGNOSIS — Y839 Surgical procedure, unspecified as the cause of abnormal reaction of the patient, or of later complication, without mention of misadventure at the time of the procedure: Secondary | ICD-10-CM | POA: Diagnosis present

## 2014-05-28 HISTORY — DX: Other specified postprocedural states: Z98.890

## 2014-05-28 HISTORY — DX: Malignant neoplasm of mouth, unspecified: C06.9

## 2014-05-28 HISTORY — DX: Nausea with vomiting, unspecified: R11.2

## 2014-05-28 LAB — CBC WITH DIFFERENTIAL/PLATELET
BASOS PCT: 0 % (ref 0–1)
Basophils Absolute: 0 10*3/uL (ref 0.0–0.1)
EOS PCT: 3 % (ref 0–5)
Eosinophils Absolute: 0.3 10*3/uL (ref 0.0–0.7)
HEMATOCRIT: 34.4 % — AB (ref 36.0–46.0)
Hemoglobin: 11.7 g/dL — ABNORMAL LOW (ref 12.0–15.0)
LYMPHS PCT: 12 % (ref 12–46)
Lymphs Abs: 1.2 10*3/uL (ref 0.7–4.0)
MCH: 33.6 pg (ref 26.0–34.0)
MCHC: 34 g/dL (ref 30.0–36.0)
MCV: 98.9 fL (ref 78.0–100.0)
MONO ABS: 2 10*3/uL — AB (ref 0.1–1.0)
Monocytes Relative: 20 % — ABNORMAL HIGH (ref 3–12)
Neutro Abs: 6.2 10*3/uL (ref 1.7–7.7)
Neutrophils Relative %: 65 % (ref 43–77)
Platelets: 295 10*3/uL (ref 150–400)
RBC: 3.48 MIL/uL — ABNORMAL LOW (ref 3.87–5.11)
RDW: 14.1 % (ref 11.5–15.5)
WBC: 9.7 10*3/uL (ref 4.0–10.5)

## 2014-05-28 LAB — COMPREHENSIVE METABOLIC PANEL
ALBUMIN: 3.5 g/dL (ref 3.5–5.2)
ALK PHOS: 88 U/L (ref 39–117)
ALT: 46 U/L — AB (ref 0–35)
AST: 45 U/L — ABNORMAL HIGH (ref 0–37)
Anion gap: 12 (ref 5–15)
BUN: 10 mg/dL (ref 6–23)
CHLORIDE: 99 meq/L (ref 96–112)
CO2: 26 mEq/L (ref 19–32)
Calcium: 8.5 mg/dL (ref 8.4–10.5)
Creatinine, Ser: 0.61 mg/dL (ref 0.50–1.10)
GFR calc Af Amer: >90 mL/min (ref >90)
GFR calc non Af Amer: >90 mL/min (ref >90)
Glucose, Bld: 127 mg/dL — ABNORMAL HIGH (ref 70–99)
POTASSIUM: 4 meq/L (ref 3.7–5.3)
Sodium: 137 mEq/L (ref 137–147)
Total Bilirubin: 0.3 mg/dL (ref 0.3–1.2)
Total Protein: 6.5 g/dL (ref 6.0–8.3)

## 2014-05-28 MED ORDER — SODIUM CHLORIDE 0.9 % IJ SOLN
3.0000 mL | INTRAMUSCULAR | Status: DC | PRN
  Administered 2014-06-02: 3 mL via INTRAVENOUS

## 2014-05-28 MED ORDER — MORPHINE SULFATE 2 MG/ML IJ SOLN
2.0000 mg | INTRAMUSCULAR | Status: DC | PRN
  Administered 2014-05-28: 2 mg via INTRAVENOUS
  Filled 2014-05-28: qty 1

## 2014-05-28 MED ORDER — HYDROCODONE-ACETAMINOPHEN 5-325 MG PO TABS
1.0000 | ORAL_TABLET | ORAL | Status: DC | PRN
  Administered 2014-05-28 – 2014-05-29 (×3): 2 via ORAL
  Administered 2014-05-29: 1 via ORAL
  Administered 2014-05-29 – 2014-05-31 (×8): 2 via ORAL
  Administered 2014-06-02: 1 via ORAL
  Filled 2014-05-28 (×8): qty 2
  Filled 2014-05-28: qty 1
  Filled 2014-05-28 (×3): qty 2
  Filled 2014-05-28: qty 1

## 2014-05-28 MED ORDER — SODIUM CHLORIDE 0.9 % IJ SOLN
3.0000 mL | Freq: Two times a day (BID) | INTRAMUSCULAR | Status: DC
  Administered 2014-05-28 – 2014-06-02 (×6): 3 mL via INTRAVENOUS
  Administered 2014-06-03: 11:00:00 via INTRAVENOUS

## 2014-05-28 MED ORDER — PROMETHAZINE HCL 25 MG PO TABS
12.5000 mg | ORAL_TABLET | Freq: Four times a day (QID) | ORAL | Status: DC | PRN
  Administered 2014-05-28 – 2014-05-31 (×3): 12.5 mg via ORAL
  Filled 2014-05-28 (×3): qty 1

## 2014-05-28 MED ORDER — SODIUM CHLORIDE 0.9 % IV SOLN: 250.0000 mL | INTRAVENOUS | Status: DC | PRN

## 2014-05-28 MED ORDER — HYDROMORPHONE HCL PF 1 MG/ML IJ SOLN
1.0000 mg | INTRAMUSCULAR | Status: DC | PRN
  Administered 2014-05-28 – 2014-06-01 (×14): 1 mg via INTRAVENOUS
  Administered 2014-06-01: 0.5 mg via INTRAVENOUS
  Administered 2014-06-01 (×3): 1 mg via INTRAVENOUS
  Filled 2014-05-28 (×19): qty 1

## 2014-05-28 MED ORDER — CLINDAMYCIN PHOSPHATE 600 MG/50ML IV SOLN
600.0000 mg | Freq: Four times a day (QID) | INTRAVENOUS | Status: DC
  Administered 2014-05-28 – 2014-06-03 (×23): 600 mg via INTRAVENOUS
  Filled 2014-05-28 (×25): qty 50

## 2014-05-28 NOTE — H&P (Signed)
Sandra Mann is an 63 y.o. female.   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 She has had persistent pain that has worsened a lot over the past week or so. Recent panorax revealed no obvious defect of the bone. Oral antibiotics have not helped.  Past Medical History  Diagnosis Date  . GERD (gastroesophageal reflux disease)   . Wears glasses   . Oral mass     left  . PONV (postoperative nausea and vomiting) 1988    "after tubes tied"  . Rheumatoid arteritis   . 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 Colonoscopy    . Finger ganglion cyst excision Right 2007    small dip  . Multiple extractions with alveoloplasty Left 03/27/2014    Procedure: EXTRACTIONS OF NECESSARY TOOTH ;  Surgeon: Ceasar Mons, DDS;  Location: [facility];  Service: Oral Surgery;  Laterality: Left;  . Excision oral tumor Left 03/27/2014    Procedure: EXCISION OF ORAL CANCER;  Surgeon: Izora Gala, MD;  Location: [facility];  Service: ENT;  Laterality: Left;  . Tubal ligation  ~ 1988    History reviewed. No pertinent family history. Social History:  reports that she quit smoking about 14 years ago. Her smoking use included Cigarettes. She has a 30 pack-year smoking history. She has never used smokeless tobacco. She reports that she drinks alcohol. She reports that she does not use illicit drugs.  Allergies:  Allergies  Allergen Reactions  . Sulfa Antibiotics Nausea And Vomiting  . Codeine Nausea And Vomiting    Medications Prior to Admission  Medication Sig Dispense Refill  . clindamycin (CLEOCIN) 300 MG capsule Take 300 mg by mouth 3 (three) times daily. Started 7/7      . cycloSPORINE (RESTASIS) 0.05 % ophthalmic emulsion Place 1 drop into both eyes 2 (two) times daily.      Marland Kitchen dexlansoprazole (DEXILANT) 60 MG capsule Take 60 mg by mouth daily.      . folic acid (FOLVITE) 1 MG tablet  Take 1 mg by mouth daily.      Marland Kitchen HYDROcodone-acetaminophen (NORCO) 7.5-325 MG per tablet Take 1 tablet by mouth every 6 (six) hours as needed for moderate pain.  30 tablet  0  . ibuprofen (ADVIL,MOTRIN) 200 MG tablet Take 600 mg by mouth every 6 (six) hours as needed for mild pain.      . mesalamine (CANASA) 1000 MG suppository Place 1,000 mg rectally at bedtime.      . methotrexate (RHEUMATREX) 2.5 MG tablet Take 10 mg by mouth See admin instructions. Take 4 tablets (10 mg) on Tuesday morning and 4 tablets (10 mg) on Tuesday night.      . metroNIDAZOLE (FLAGYL) 500 MG tablet Take 500 mg by mouth every 6 (six) hours. 7 day supply (7/27-8/2)      . Multiple Vitamin (MULTIVITAMIN WITH MINERALS) TABS tablet Take 1 tablet by mouth daily.      . ondansetron (ZOFRAN) 4 MG tablet Take 4 mg by mouth every 8 (eight) hours as needed for nausea or vomiting.      Marland Kitchen oxyCODONE-acetaminophen (PERCOCET/ROXICET) 5-325 MG per tablet Take 1 tablet by mouth every 6 (six) hours as needed for moderate pain.       . promethazine (PHENERGAN) 25 MG suppository Place 1 suppository (25 mg total) rectally every 6 (six) hours as needed for nausea or vomiting.  12 each  0  . traMADol (ULTRAM) 50 MG tablet Take 50 mg by mouth every 6 (six) hours as needed for moderate pain.       . valACYclovir (VALTREX) 500 MG tablet Take 500 mg by mouth 2 (two) times daily as needed (for fever blisters).         No results found for this or any previous visit (from the past 48 hour(s)). No results found.  ROS: otherwise negative  Blood pressure 134/52, pulse 66, temperature 97.9 F (36.6 C), temperature source Oral, resp. rate 17, height 5' 3"  (1.6 m), weight 125 lb (56.7 kg), SpO2 100.00%.  PHYSICAL EXAM: Overall appearance:  Healthy appearing, in significant discomfort. Head:  Normocephalic, atraumatic. Ears: External auditory canals are clear; tympanic membranes are intact and the middle ears are free of any effusion. Nose: External  nose is healthy in appearance. Internal nasal exam free of any lesions or obstruction. Oral Cavity/pharynx:  There are no mucosal lesions or masses identified. The lower alveolar defect is intact with moderate tenderness. Trismus is present. Neck: No palpable neck masses.  Studies Reviewed: none    Assessment/Plan Admit for IV antibiotics and anelgesics.   Ebrahim Deremer 05/28/2014, 5:05 PM

## 2014-05-29 DIAGNOSIS — Z87891 Personal history of nicotine dependence: Secondary | ICD-10-CM | POA: Diagnosis not present

## 2014-05-29 DIAGNOSIS — M272 Inflammatory conditions of jaws: Secondary | ICD-10-CM | POA: Diagnosis present

## 2014-05-29 DIAGNOSIS — Y839 Surgical procedure, unspecified as the cause of abnormal reaction of the patient, or of later complication, without mention of misadventure at the time of the procedure: Secondary | ICD-10-CM | POA: Diagnosis present

## 2014-05-29 DIAGNOSIS — K219 Gastro-esophageal reflux disease without esophagitis: Secondary | ICD-10-CM | POA: Diagnosis present

## 2014-05-29 DIAGNOSIS — T8140XA Infection following a procedure, unspecified, initial encounter: Secondary | ICD-10-CM | POA: Diagnosis present

## 2014-05-29 DIAGNOSIS — Z85819 Personal history of malignant neoplasm of unspecified site of lip, oral cavity, and pharynx: Secondary | ICD-10-CM | POA: Diagnosis not present

## 2014-05-29 NOTE — Progress Notes (Signed)
Utilization Review Completed.Sandra Mann T7/29/2015  

## 2014-05-29 NOTE — Progress Notes (Signed)
Feeling much better with pain medicine and first few doses of IV antibiotics. Trismus persists. Afebrile, no elevated white blood cell count. Continue IV antibiotics and analgesics.

## 2014-05-30 NOTE — Progress Notes (Signed)
Subjective: Pain has increased again. She is back where she started.  Objective: Vital signs in last 24 hours: Temp:  [98.8 F (37.1 C)-99 F (37.2 C)] 98.8 F (37.1 C) (07/30 0511) Pulse Rate:  [66-76] 66 (07/30 0511) Resp:  [16-18] 16 (07/30 0511) BP: (119-132)/(48-69) 119/54 mmHg (07/30 0511) SpO2:  [97 %-100 %] 97 % (07/30 0511) Weight change:  Last BM Date: 05/29/14  Intake/Output from previous day: 07/29 0701 - 07/30 0700 In: 1150 [P.O.:950; IV Piggyback:200] Out: 450 [Urine:450] Intake/Output this shift:    PHYSICAL EXAM: No new findings, swelling or any other abnormalities identified  Lab Results:  Recent Labs  05/28/14 1645  WBC 9.7  HGB 11.7*  HCT 34.4*  PLT 295   BMET  Recent Labs  05/28/14 1645  NA 137  K 4.0  CL 99  CO2 26  GLUCOSE 127*  BUN 10  CREATININE 0.61  CALCIUM 8.5    Studies/Results: No results found.  Medications: I have reviewed the patient's current medications.  Assessment/Plan: Intense pain without any overt physical findings. While the cancer was small and in situ, I'm concerned about the possibility of recurrence within the trigeminal nerve. We will order an MRI of the neck to evaluate this.  LOS: 2 days   Sandra Mann 05/30/2014, 8:43 AM

## 2014-05-31 ENCOUNTER — Inpatient Hospital Stay (HOSPITAL_COMMUNITY): Payer: 59

## 2014-05-31 MED ORDER — GADOBENATE DIMEGLUMINE 529 MG/ML IV SOLN
10.0000 mL | Freq: Once | INTRAVENOUS | Status: AC | PRN
  Administered 2014-05-31: 10 mL via INTRAVENOUS

## 2014-05-31 NOTE — Progress Notes (Signed)
No new symptoms, exam unchanged. MRI still pending.

## 2014-05-31 NOTE — Progress Notes (Signed)
MRI reviewed with radiology staff. There is an apparent masticator space abscess. There is diffuse surrounding inflammation and osteomyelitis. We discussed this and we are going to schedule for incision and drainage intraorally first thing in the morning.

## 2014-06-01 ENCOUNTER — Encounter (HOSPITAL_COMMUNITY)
Admission: AD | Disposition: A | Discharge status: Home / Self Care | Payer: Self-pay | Source: Ambulatory Visit | Attending: Otolaryngology

## 2014-06-01 ENCOUNTER — Inpatient Hospital Stay (HOSPITAL_COMMUNITY): Payer: 59 | Admitting: Anesthesiology

## 2014-06-01 ENCOUNTER — Encounter (HOSPITAL_COMMUNITY): Payer: Self-pay | Admitting: Anesthesiology

## 2014-06-01 ENCOUNTER — Encounter (HOSPITAL_COMMUNITY): Payer: 59 | Admitting: Anesthesiology

## 2014-06-01 HISTORY — PX: INCISION AND DRAINAGE OF PERITONSILLAR ABCESS: SHX6257

## 2014-06-01 LAB — SURGICAL PCR SCREEN
MRSA, PCR: NEGATIVE
STAPHYLOCOCCUS AUREUS: NEGATIVE

## 2014-06-01 SURGERY — INCISION AND DRAINAGE, ABSCESS, PERITONSILLAR
Anesthesia: General | Site: Mouth | Laterality: Left

## 2014-06-01 MED ORDER — SCOPOLAMINE 1 MG/3DAYS TD PT72
MEDICATED_PATCH | TRANSDERMAL | Status: AC
  Administered 2014-06-01: 1.5 mg via TRANSDERMAL
  Filled 2014-06-01: qty 1

## 2014-06-01 MED ORDER — IBUPROFEN 600 MG PO TABS
600.0000 mg | ORAL_TABLET | Freq: Four times a day (QID) | ORAL | Status: DC | PRN
  Administered 2014-06-02 (×2): 600 mg via ORAL
  Filled 2014-06-01 (×4): qty 1

## 2014-06-01 MED ORDER — FENTANYL CITRATE 0.05 MG/ML IJ SOLN
INTRAMUSCULAR | Status: DC | PRN
  Administered 2014-06-01 (×2): 50 ug via INTRAVENOUS

## 2014-06-01 MED ORDER — HYDROMORPHONE HCL PF 1 MG/ML IJ SOLN
INTRAMUSCULAR | Status: AC
  Administered 2014-06-01: 0.5 mg via INTRAVENOUS
  Filled 2014-06-01: qty 1

## 2014-06-01 MED ORDER — METHOTREXATE (ANTI-RHEUMATIC) 2.5 MG PO TABS
10.0000 mg | ORAL_TABLET | ORAL | Status: DC
  Filled 2014-06-01: qty 4

## 2014-06-01 MED ORDER — PROPOFOL 10 MG/ML IV BOLUS
INTRAVENOUS | Status: AC
  Filled 2014-06-01: qty 20

## 2014-06-01 MED ORDER — LACTATED RINGERS IV SOLN
INTRAVENOUS | Status: DC | PRN
  Administered 2014-06-01: 08:00:00 via INTRAVENOUS

## 2014-06-01 MED ORDER — ONDANSETRON HCL 4 MG/2ML IJ SOLN: 4.0000 mg | Freq: Once | INTRAMUSCULAR | Status: DC | PRN

## 2014-06-01 MED ORDER — LIDOCAINE HCL (CARDIAC) 20 MG/ML IV SOLN
INTRAVENOUS | Status: DC | PRN
  Administered 2014-06-01: 60 mg via INTRAVENOUS

## 2014-06-01 MED ORDER — MIDAZOLAM HCL 2 MG/2ML IJ SOLN
INTRAMUSCULAR | Status: AC
  Filled 2014-06-01: qty 2

## 2014-06-01 MED ORDER — CYCLOSPORINE 0.05 % OP EMUL
1.0000 [drp] | Freq: Two times a day (BID) | OPHTHALMIC | Status: DC
  Administered 2014-06-01 – 2014-06-03 (×4): 1 [drp] via OPHTHALMIC
  Filled 2014-06-01 (×6): qty 1

## 2014-06-01 MED ORDER — SUCCINYLCHOLINE CHLORIDE 20 MG/ML IJ SOLN
INTRAMUSCULAR | Status: DC | PRN
  Administered 2014-06-01: 100 mg via INTRAVENOUS

## 2014-06-01 MED ORDER — CLINDAMYCIN HCL 300 MG PO CAPS: 300.0000 mg | ORAL_CAPSULE | Freq: Three times a day (TID) | ORAL | Status: DC

## 2014-06-01 MED ORDER — HYDROCODONE-ACETAMINOPHEN 7.5-325 MG PO TABS
1.0000 | ORAL_TABLET | Freq: Four times a day (QID) | ORAL | Status: DC | PRN
  Administered 2014-06-02 – 2014-06-03 (×4): 1 via ORAL
  Filled 2014-06-01 (×4): qty 1

## 2014-06-01 MED ORDER — PANTOPRAZOLE SODIUM 40 MG PO TBEC
40.0000 mg | DELAYED_RELEASE_TABLET | Freq: Every day | ORAL | Status: DC
  Administered 2014-06-01 – 2014-06-03 (×3): 40 mg via ORAL
  Filled 2014-06-01 (×3): qty 1

## 2014-06-01 MED ORDER — FOLIC ACID 1 MG PO TABS
1.0000 mg | ORAL_TABLET | Freq: Every day | ORAL | Status: DC
  Administered 2014-06-01 – 2014-06-03 (×3): 1 mg via ORAL
  Filled 2014-06-01 (×4): qty 1

## 2014-06-01 MED ORDER — LIDOCAINE-EPINEPHRINE 1 %-1:100000 IJ SOLN
INTRAMUSCULAR | Status: DC | PRN
  Administered 2014-06-01: 3 mL

## 2014-06-01 MED ORDER — TRAMADOL HCL 50 MG PO TABS
50.0000 mg | ORAL_TABLET | Freq: Four times a day (QID) | ORAL | Status: DC | PRN
  Administered 2014-06-02: 50 mg via ORAL
  Filled 2014-06-01: qty 1

## 2014-06-01 MED ORDER — SCOPOLAMINE 1 MG/3DAYS TD PT72
1.0000 | MEDICATED_PATCH | TRANSDERMAL | Status: DC
  Administered 2014-06-01: 1.5 mg via TRANSDERMAL

## 2014-06-01 MED ORDER — SUCCINYLCHOLINE CHLORIDE 20 MG/ML IJ SOLN
INTRAMUSCULAR | Status: AC
  Filled 2014-06-01: qty 1

## 2014-06-01 MED ORDER — PROPOFOL 10 MG/ML IV BOLUS
INTRAVENOUS | Status: DC | PRN
  Administered 2014-06-01: 200 mg via INTRAVENOUS

## 2014-06-01 MED ORDER — ONDANSETRON HCL 4 MG PO TABS
4.0000 mg | ORAL_TABLET | Freq: Three times a day (TID) | ORAL | Status: DC | PRN
  Administered 2014-06-03: 4 mg via ORAL
  Filled 2014-06-01: qty 1

## 2014-06-01 MED ORDER — ONDANSETRON HCL 4 MG/2ML IJ SOLN
INTRAMUSCULAR | Status: DC | PRN
  Administered 2014-06-01: 4 mg via INTRAVENOUS

## 2014-06-01 MED ORDER — OXYCODONE-ACETAMINOPHEN 5-325 MG PO TABS
1.0000 | ORAL_TABLET | Freq: Four times a day (QID) | ORAL | Status: DC | PRN
  Administered 2014-06-01: 1 via ORAL
  Filled 2014-06-01: qty 1

## 2014-06-01 MED ORDER — METRONIDAZOLE 500 MG PO TABS
500.0000 mg | ORAL_TABLET | Freq: Four times a day (QID) | ORAL | Status: DC
  Administered 2014-06-01 – 2014-06-02 (×4): 500 mg via ORAL
  Filled 2014-06-01 (×8): qty 1

## 2014-06-01 MED ORDER — MESALAMINE 1000 MG RE SUPP
1000.0000 mg | Freq: Every day | RECTAL | Status: DC
  Administered 2014-06-01 – 2014-06-02 (×2): 1000 mg via RECTAL
  Filled 2014-06-01 (×3): qty 1

## 2014-06-01 MED ORDER — LIDOCAINE-EPINEPHRINE 2 %-1:100000 IJ SOLN
INTRAMUSCULAR | Status: AC
  Filled 2014-06-01: qty 1

## 2014-06-01 MED ORDER — ADULT MULTIVITAMIN W/MINERALS CH
1.0000 | ORAL_TABLET | Freq: Every day | ORAL | Status: DC
  Administered 2014-06-01 – 2014-06-03 (×3): 1 via ORAL
  Filled 2014-06-01 (×3): qty 1

## 2014-06-01 MED ORDER — PROMETHAZINE HCL 25 MG RE SUPP: 25.0000 mg | Freq: Four times a day (QID) | RECTAL | Status: DC | PRN

## 2014-06-01 MED ORDER — EPHEDRINE SULFATE 50 MG/ML IJ SOLN
INTRAMUSCULAR | Status: AC
  Filled 2014-06-01: qty 1

## 2014-06-01 MED ORDER — LACTATED RINGERS IV SOLN
INTRAVENOUS | Status: DC
  Administered 2014-06-01: 07:00:00 via INTRAVENOUS

## 2014-06-01 MED ORDER — HYDROMORPHONE HCL PF 1 MG/ML IJ SOLN
0.2500 mg | INTRAMUSCULAR | Status: DC | PRN
  Administered 2014-06-01 (×4): 0.5 mg via INTRAVENOUS

## 2014-06-01 MED ORDER — FENTANYL CITRATE 0.05 MG/ML IJ SOLN
INTRAMUSCULAR | Status: AC
  Filled 2014-06-01: qty 5

## 2014-06-01 MED ORDER — ROCURONIUM BROMIDE 50 MG/5ML IV SOLN
INTRAVENOUS | Status: AC
  Filled 2014-06-01: qty 1

## 2014-06-01 MED ORDER — MIDAZOLAM HCL 5 MG/5ML IJ SOLN
INTRAMUSCULAR | Status: DC | PRN
  Administered 2014-06-01: 2 mg via INTRAVENOUS

## 2014-06-01 MED ORDER — SODIUM CHLORIDE 0.9 % IJ SOLN
INTRAMUSCULAR | Status: AC
  Filled 2014-06-01: qty 10

## 2014-06-01 MED ORDER — OXYCODONE HCL 5 MG/5ML PO SOLN: 5.0000 mg | Freq: Once | ORAL | Status: DC | PRN

## 2014-06-01 MED ORDER — OXYCODONE HCL 5 MG PO TABS: 5.0000 mg | ORAL_TABLET | Freq: Once | ORAL | Status: DC | PRN

## 2014-06-01 MED ORDER — LIDOCAINE-EPINEPHRINE (PF) 1 %-1:200000 IJ SOLN
INTRAMUSCULAR | Status: AC
  Filled 2014-06-01: qty 10

## 2014-06-01 MED ORDER — VALACYCLOVIR HCL 500 MG PO TABS
500.0000 mg | ORAL_TABLET | Freq: Two times a day (BID) | ORAL | Status: DC | PRN
  Administered 2014-06-03: 500 mg via ORAL
  Filled 2014-06-01: qty 1

## 2014-06-01 SURGICAL SUPPLY — 30 items
CANISTER SUCTION 2500CC (MISCELLANEOUS) ×2 IMPLANT
CATH ROBINSON RED A/P 10FR (CATHETERS) ×2 IMPLANT
CLEANER TIP ELECTROSURG 2X2 (MISCELLANEOUS) ×2 IMPLANT
COAGULATOR SUCT SWTCH 10FR 6 (ELECTROSURGICAL) ×2 IMPLANT
DRAIN PENROSE 1/4X12 LTX STRL (WOUND CARE) ×2 IMPLANT
ELECT COATED BLADE 2.86 ST (ELECTRODE) ×4 IMPLANT
ELECT REM PT RETURN 9FT ADLT (ELECTROSURGICAL)
ELECT REM PT RETURN 9FT PED (ELECTROSURGICAL)
ELECTRODE REM PT RETRN 9FT PED (ELECTROSURGICAL) IMPLANT
ELECTRODE REM PT RTRN 9FT ADLT (ELECTROSURGICAL) IMPLANT
GAUZE SPONGE 4X4 16PLY XRAY LF (GAUZE/BANDAGES/DRESSINGS) ×2 IMPLANT
GLOVE ECLIPSE 7.5 STRL STRAW (GLOVE) ×2 IMPLANT
GOWN STRL REUS W/ TWL LRG LVL3 (GOWN DISPOSABLE) ×2 IMPLANT
GOWN STRL REUS W/TWL LRG LVL3 (GOWN DISPOSABLE) ×2
KIT BASIN OR (CUSTOM PROCEDURE TRAY) ×2 IMPLANT
KIT ROOM TURNOVER OR (KITS) ×2 IMPLANT
NS IRRIG 1000ML POUR BTL (IV SOLUTION) ×2 IMPLANT
PACK SURGICAL SETUP 50X90 (CUSTOM PROCEDURE TRAY) ×2 IMPLANT
PAD ARMBOARD 7.5X6 YLW CONV (MISCELLANEOUS) ×4 IMPLANT
PENCIL FOOT CONTROL (ELECTRODE) ×2 IMPLANT
SPECIMEN JAR SMALL (MISCELLANEOUS) ×4 IMPLANT
SPONGE TONSIL 1 RF SGL (DISPOSABLE) ×4 IMPLANT
SYR BULB 3OZ (MISCELLANEOUS) ×2 IMPLANT
TOWEL OR 17X24 6PK STRL BLUE (TOWEL DISPOSABLE) ×4 IMPLANT
TUBE CONNECTING 12X1/4 (SUCTIONS) ×2 IMPLANT
TUBE SALEM SUMP 10F W/ARV (TUBING) IMPLANT
TUBE SALEM SUMP 12R W/ARV (TUBING) IMPLANT
TUBE SALEM SUMP 14F W/ARV (TUBING) IMPLANT
TUBE SALEM SUMP 16 FR W/ARV (TUBING) IMPLANT
WATER STERILE IRR 1000ML POUR (IV SOLUTION) ×2 IMPLANT

## 2014-06-01 NOTE — Anesthesia Postprocedure Evaluation (Signed)
  Anesthesia Post-op Note  Patient: Sandra Mann  Procedure(s) Performed: Procedure(s): INCISION AND DRAINAGE Masticator Space Infection (Left)  Patient Location: PACU  Anesthesia Type:General  Level of Consciousness: awake, alert  and oriented  Airway and Oxygen Therapy: Patient Spontanous Breathing and Patient connected to nasal cannula oxygen  Post-op Pain: mild  Post-op Assessment: Post-op Vital signs reviewed  Post-op Vital Signs: Reviewed  Last Vitals:  Filed Vitals:   06/01/14 0843  BP: 163/66  Pulse: 80  Temp: 36.4 C  Resp: 18    Complications: No apparent anesthesia complications

## 2014-06-01 NOTE — Interval H&P Note (Signed)
History and Physical Interval Note:  06/01/2014 7:29 AM  Sandra Mann  has presented today for surgery, with the diagnosis of Jaw Abscess  The various methods of treatment have been discussed with the patient and family. After consideration of risks, benefits and other options for treatment, the patient has consented to  Procedure(s): INCISION AND DRAINAGE Masticator Space Infection (N/A) as a surgical intervention .  The patient's history has been reviewed, patient examined, no change in status, stable for surgery.  I have reviewed the patient's chart and labs.  Questions were answered to the patient's satisfaction.     Britani Beattie

## 2014-06-01 NOTE — Anesthesia Preprocedure Evaluation (Addendum)
Anesthesia Evaluation  Patient identified by MRN, date of birth, ID band Patient awake    Reviewed: Allergy & Precautions, H&P , NPO status , Patient's Chart, lab work & pertinent test results  History of Anesthesia Complications (+) PONV and history of anesthetic complications (Pt denies any hx)  Airway Mallampati: III TM Distance: >3 FB Neck ROM: Full  Mouth opening: Limited Mouth Opening  Dental  (+) Teeth Intact, Dental Advisory Given   Pulmonary former smoker,  breath sounds clear to auscultation        Cardiovascular Rhythm:Regular Rate:Normal     Neuro/Psych    GI/Hepatic GERD-  Medicated and Controlled,  Endo/Other    Renal/GU      Musculoskeletal   Abdominal   Peds  Hematology   Anesthesia Other Findings   Reproductive/Obstetrics                         Anesthesia Physical Anesthesia Plan  ASA: III  Anesthesia Plan: General   Post-op Pain Management:    Induction: Intravenous  Airway Management Planned: Oral ETT and Video Laryngoscope Planned  Additional Equipment:   Intra-op Plan:   Post-operative Plan: Extubation in OR  Informed Consent: I have reviewed the patients History and Physical, chart, labs and discussed the procedure including the risks, benefits and alternatives for the proposed anesthesia with the patient or authorized representative who has indicated his/her understanding and acceptance.   Dental advisory given  Plan Discussed with: CRNA, Anesthesiologist and Surgeon  Anesthesia Plan Comments:         Anesthesia Quick Evaluation

## 2014-06-01 NOTE — Op Note (Signed)
05/28/2014 - 06/01/2014  8:23 AM  PATIENT:  Sandra Mann  63 y.o. female  PRE-OPERATIVE DIAGNOSIS:  Jaw Abscess  POST-OPERATIVE DIAGNOSIS:  masticator abscess- left  PROCEDURE:  Procedure(s): INCISION AND DRAINAGE Masticator Space Infection  SURGEON:  Surgeon(s): Izora Gala, MD  ANESTHESIA:   General  COUNTS: Correct   DICTATION: The patient was taken to the operating room and placed on the operating table in the supine position. Following induction of general endotracheal anesthesia, the table was turned and the patient was draped in a standard fashion. A Crowe-Davis mouthgag was inserted into the oral cavity and used to retract the tongue and mandible, then attached to the Mayo stand.  The retromolar trigone area was draining pus. The mucosa and surrounding mucosa was infiltrated with 1% xylocaine with epinephrine. An 14 G needle was used to aspirate multiple places along the retromolar trigone and the surrounding masticator space. No additional purulence was identified. Electrocautery was then used to create a transverse incision in the RMT and a hemostat was used to open up the suspected abscess cavity. Cultures were obtained. A 1/4 inch penrose was packed into the wound and cut to size. This was secured in place with a single 3-0 Nylon suture.   The pharynx was irrigated with saline and suctioned.  The patient was then awakened from anesthesia and transferred to PACU in stable condition.   PATIENT DISPOSITION:  To PACA, stable

## 2014-06-01 NOTE — H&P (View-Only) (Signed)
MRI reviewed with radiology staff. There is an apparent masticator space abscess. There is diffuse surrounding inflammation and osteomyelitis. We discussed this and we are going to schedule for incision and drainage intraorally first thing in the morning.

## 2014-06-01 NOTE — Progress Notes (Signed)
Patient found in floor at the end of bed, iv out, scds on.  Stated she fell, but she was ok. Denies hitting her head. Stated she thought she fell on her bottom. No complaints of pain. Wanted to go to the bathroom. Upon assessment no abrasions, denies hitting her head, alert and oriented x 4. Vial signs 98.2 , hr 100, 100%room air, respirations 20, bp 144/58. Dr. Constance Holster was notified, no orders received. Will continue to monitor. Bed alarm is on.

## 2014-06-01 NOTE — Transfer of Care (Signed)
Immediate Anesthesia Transfer of Care Note  Patient: Sandra Mann  Procedure(s) Performed: Procedure(s): INCISION AND DRAINAGE Masticator Space Infection (Left)  Patient Location: PACU  Anesthesia Type:General  Level of Consciousness: awake  Airway & Oxygen Therapy: Patient Spontanous Breathing and Patient connected to face mask oxygen  Post-op Assessment: Report given to PACU RN and Post -op Vital signs reviewed and stable  Post vital signs: Reviewed and stable  Complications: No apparent anesthesia complications

## 2014-06-02 MED ORDER — PHENOL 1.4 % MT LIQD
1.0000 | OROMUCOSAL | Status: DC | PRN
  Administered 2014-06-02 (×2): 1 via OROMUCOSAL
  Filled 2014-06-02: qty 177

## 2014-06-02 MED ORDER — HYDROCODONE-ACETAMINOPHEN 7.5-325 MG/15ML PO SOLN
10.0000 mL | Freq: Four times a day (QID) | ORAL | Status: DC | PRN
  Administered 2014-06-02: 10 mL via ORAL
  Filled 2014-06-02: qty 15

## 2014-06-02 NOTE — Progress Notes (Signed)
Subjective: She feels a little bit better today. Less pain.  Objective: Vital signs in last 24 hours: Temp:  [97.8 F (36.6 C)-100 F (37.8 C)] 97.8 F (36.6 C) (08/02 0611) Pulse Rate:  [62-100] 62 (08/02 0611) Resp:  [16-20] 16 (08/02 0611) BP: (104-144)/(40-76) 104/55 mmHg (08/02 0611) SpO2:  [99 %-100 %] 99 % (08/02 0611) Weight change:  Last BM Date: 05/29/14  Intake/Output from previous day: 08/01 0701 - 08/02 0700 In: 975 [P.O.:240; I.V.:735] Out: 5 [Blood:5] Intake/Output this shift:    PHYSICAL EXAM: Less mandibular swelling today. Trismus unchanged. She seems more comfortable.  Lab Results: No results found for this basename: WBC, HGB, HCT, PLT,  in the last 72 hours BMET No results found for this basename: NA, K, CL, CO2, GLUCOSE, BUN, CREATININE, CALCIUM,  in the last 72 hours  Studies/Results: Mr Face/trigeminal Wo/w Cm  05/31/2014   CLINICAL DATA:  Oral cancer involving the left posterior mandibular alveolar tissue. Status post resection 03/27/2014. Persistent pain with progression over the last week.  EXAM: MR FACE/TRIGEMINAL WO/W CM  TECHNIQUE: Multiplanar, multisequence MR imaging was performed both before and after administration of intravenous contrast.  CONTRAST:  78m MULTIHANCE GADOBENATE DIMEGLUMINE 529 MG/ML IV SOLN  COMPARISON:  None.  FINDINGS: Extensive enhancement is present within the left masticator space and throughout the left temporalis muscle. A ring-enhancing fluid collection is compatible with an abscess in the masticator space just anterior to the vertical ramus of the mandible, measuring 13 x 12 x 20 mm. The left temporalis muscle is significantly thickened compared to the right. It measures up to 14 mm.  Enhancement and continues to the soft tissue surrounding the angle of the mandible. There is diffuse abnormal signal within both a horizontal and vertical ramus of the mandible with enhancement compatible with osteomyelitis.  No definite  residual recurrent tumor is evident. There is no evidence for perineural spread. The cavernous sinus is normal bilaterally. The foramina bowel a is within normal limits.  Limited imaging of the brain is unremarkable. The globes and orbits are intact. Flow is present in the major intracranial arteries.  IMPRESSION: 1. Extensive abnormal enhancement and edema within the left masticator space and temporalis muscle compatible with diffuse myositis and infection. 2. 13 x 12 x 20 mm peripherally enhancing fluid collection just anterior to the vertical ramus of the left mandible compatible with abscess. 3. Diffuse abnormal marrow signal and enhancement compatible with osteomyelitis in the left mandible. 4. No definite residual or recurrent tumor. In the setting of diffuse infection, subtle tumor may be obscured.   Electronically Signed   By: CLawrence SantiagoM.D.   On: 05/31/2014 16:46    Medications: I have reviewed the patient's current medications.  Assessment/Plan: Postop day 1, slightly improved.., Cut back on her pain medicine little bit. She had a fall last night but did not injure anything. Cultures are pending.  LOS: 5 days   Andrea Colglazier 06/02/2014, 9:48 AM

## 2014-06-03 ENCOUNTER — Encounter (HOSPITAL_COMMUNITY): Payer: Self-pay | Admitting: Otolaryngology

## 2014-06-03 MED ORDER — HYDROCODONE-ACETAMINOPHEN 7.5-325 MG PO TABS: 1.0000 | ORAL_TABLET | Freq: Four times a day (QID) | ORAL | Status: DC | PRN

## 2014-06-03 MED ORDER — CLINDAMYCIN HCL 300 MG PO CAPS: 300.0000 mg | ORAL_CAPSULE | Freq: Four times a day (QID) | ORAL | Status: DC

## 2014-06-03 MED ORDER — PROMETHAZINE HCL 25 MG RE SUPP: 25.0000 mg | Freq: Four times a day (QID) | RECTAL | Status: DC | PRN

## 2014-06-03 MED ORDER — CLINDAMYCIN HCL 300 MG PO CAPS
300.0000 mg | ORAL_CAPSULE | Freq: Four times a day (QID) | ORAL | Status: DC
  Administered 2014-06-03: 300 mg via ORAL
  Filled 2014-06-03: qty 1

## 2014-06-03 MED ORDER — MENTHOL 3 MG MT LOZG: 1.0000 | LOZENGE | OROMUCOSAL | Status: DC | PRN

## 2014-06-03 NOTE — Discharge Instructions (Signed)
Eat and drink whenever you can. Rinse mouth with saltwater 3 times daily.

## 2014-06-03 NOTE — Progress Notes (Signed)
Subjective: She's feeling about the same, no better, no worse.  Objective: Vital signs in last 24 hours: Temp:  [98 F (36.7 C)-98.5 F (36.9 C)] 98.5 F (36.9 C) (08/03 0533) Pulse Rate:  [69-77] 69 (08/03 0533) Resp:  [16] 16 (08/03 0533) BP: (113-134)/(57-59) 121/59 mmHg (08/03 0533) SpO2:  [100 %] 100 % (08/03 0533) Weight change:  Last BM Date: 05/29/14  Intake/Output from previous day: 08/02 0701 - 08/03 0700 In: 720 [P.O.:240; I.V.:80; IV Piggyback:400] Out: 500 [Urine:500] Intake/Output this shift: Total I/O In: 120 [P.O.:120] Out: -   PHYSICAL EXAM: Minimal external swelling. She looks and sounds better overall. She seems more comfortable.  Lab Results: No results found for this basename: WBC, HGB, HCT, PLT,  in the last 72 hours BMET No results found for this basename: NA, K, CL, CO2, GLUCOSE, BUN, CREATININE, CALCIUM,  in the last 72 hours  Studies/Results: No results found.  Medications: I have reviewed the patient's current medications.  Assessment/Plan: Postop day 2, slowly improving. Cultures are negative thus far. We will switch to oral antibiotic, and consider discharge in a day or so.  LOS: 6 days   Laquasia Pincus 06/03/2014, 9:16 AM

## 2014-06-03 NOTE — Progress Notes (Signed)
AVS discharge instructions were given and went over with patient. Patient was also given prescriptions for hydrocodone, phernergan, and clindamycin. Patient stated that she did not have any questions. Patient is now waiting for her transportation to come and pick her up.

## 2014-06-04 NOTE — Discharge Summary (Signed)
Physician Discharge Summary  Patient ID: Sandra Mann MRN: [id number] DOB/AGE: August 18, 1951 63 y.o.  Admit date: 05/28/2014 Discharge date: 06/04/2014  Admission Diagnoses: Mandibular infection with abscess  Discharge Diagnoses:  Active Problems:   Osteomyelitis of mandible   Infection of mandible   Discharged Condition: good  Hospital Course: Minimal improvement on intravenous antibiotics. MRI revealed what appeared to be an abscess. Masticator space abscess was drained and then she clinically improved much quicker.  Consults: none  Significant Diagnostic Studies: MRI of the face  Treatments: surgery: Incision and drainage of masticator space abscess  Discharge Exam: Blood pressure 121/59, pulse 69, temperature 98.5 F (36.9 C), temperature source Oral, resp. rate 16, height 5' 3"  (1.6 m), weight 125 lb (56.7 kg), SpO2 100.00%. PHYSICAL EXAM: Much less swelling and tenderness. Drain in place.  Disposition: 01-Home or Self Care  Discharge Instructions   Diet - low sodium heart healthy    Complete by:  As directed      Increase activity slowly    Complete by:  As directed             Medication List         CANASA 1000 MG suppository  Generic drug:  mesalamine  Place 1,000 mg rectally at bedtime.     clindamycin 300 MG capsule  Commonly known as:  CLEOCIN  Take 300 mg by mouth 3 (three) times daily. Started 7/7     clindamycin 300 MG capsule  Commonly known as:  CLEOCIN  Take 1 capsule (300 mg total) by mouth 4 (four) times daily.     cycloSPORINE 0.05 % ophthalmic emulsion  Commonly known as:  RESTASIS  Place 1 drop into both eyes 2 (two) times daily.     DEXILANT 60 MG capsule  Generic drug:  dexlansoprazole  Take 60 mg by mouth daily.     folic acid 1 MG tablet  Commonly known as:  FOLVITE  Take 1 mg by mouth daily.     HYDROcodone-acetaminophen 7.5-325 MG per tablet  Commonly known as:  NORCO  Take 1 tablet by mouth every 6 (six) hours as needed  for moderate pain.     HYDROcodone-acetaminophen 7.5-325 MG per tablet  Commonly known as:  NORCO  Take 1 tablet by mouth every 6 (six) hours as needed for moderate pain.     ibuprofen 200 MG tablet  Commonly known as:  ADVIL,MOTRIN  Take 600 mg by mouth every 6 (six) hours as needed for mild pain.     methotrexate 2.5 MG tablet  Commonly known as:  RHEUMATREX  Take 10 mg by mouth See admin instructions. Take 4 tablets (10 mg) on Tuesday morning and 4 tablets (10 mg) on Tuesday night.     metroNIDAZOLE 500 MG tablet  Commonly known as:  FLAGYL  Take 500 mg by mouth every 6 (six) hours. 7 day supply (7/27-8/2)     multivitamin with minerals Tabs tablet  Take 1 tablet by mouth daily.     ondansetron 4 MG tablet  Commonly known as:  ZOFRAN  Take 4 mg by mouth every 8 (eight) hours as needed for nausea or vomiting.     oxyCODONE-acetaminophen 5-325 MG per tablet  Commonly known as:  PERCOCET/ROXICET  Take 1 tablet by mouth every 6 (six) hours as needed for moderate pain.     promethazine 25 MG suppository  Commonly known as:  PHENERGAN  Place 1 suppository (25 mg total) rectally every 6 (six) hours as  needed for nausea or vomiting.     promethazine 25 MG suppository  Commonly known as:  PHENERGAN  Place 1 suppository (25 mg total) rectally every 6 (six) hours as needed for nausea or vomiting.     traMADol 50 MG tablet  Commonly known as:  ULTRAM  Take 50 mg by mouth every 6 (six) hours as needed for moderate pain.     valACYclovir 500 MG tablet  Commonly known as:  VALTREX  Take 500 mg by mouth 2 (two) times daily as needed (for fever blisters).           Follow-up Information   Follow up with Izora Gala, MD. Schedule an appointment as soon as possible for a visit on 06/07/2014.   Specialty:  Otolaryngology   Contact information:   [address] [place] [address] [phone]       Signed: Izora Gala 06/04/2014, 8:56 AM

## 2014-06-06 LAB — ANAEROBIC CULTURE

## 2014-06-06 LAB — CULTURE, ROUTINE-ABSCESS

## 2014-06-10 ENCOUNTER — Other Ambulatory Visit (HOSPITAL_COMMUNITY): Payer: Self-pay | Admitting: Otolaryngology

## 2014-06-10 ENCOUNTER — Encounter (HOSPITAL_COMMUNITY): Payer: Self-pay

## 2014-06-10 ENCOUNTER — Ambulatory Visit (HOSPITAL_COMMUNITY)
Admission: RE | Admit: 2014-06-10 | Discharge: 2014-06-10 | Disposition: A | Discharge status: Home / Self Care | Payer: 59 | Source: Ambulatory Visit | Attending: Otolaryngology | Admitting: Otolaryngology

## 2014-06-10 ENCOUNTER — Inpatient Hospital Stay (HOSPITAL_COMMUNITY)
Admission: AD | Admit: 2014-06-10 | Discharge: 2014-06-18 | DRG: 138 | Disposition: A | Discharge status: Home / Self Care | Payer: 59 | Source: Ambulatory Visit | Attending: Otolaryngology | Admitting: Otolaryngology

## 2014-06-10 DIAGNOSIS — Z888 Allergy status to other drugs, medicaments and biological substances status: Secondary | ICD-10-CM | POA: Diagnosis not present

## 2014-06-10 DIAGNOSIS — M272 Inflammatory conditions of jaws: Secondary | ICD-10-CM

## 2014-06-10 DIAGNOSIS — Z882 Allergy status to sulfonamides status: Secondary | ICD-10-CM | POA: Diagnosis not present

## 2014-06-10 DIAGNOSIS — B952 Enterococcus as the cause of diseases classified elsewhere: Secondary | ICD-10-CM | POA: Diagnosis present

## 2014-06-10 DIAGNOSIS — Z85819 Personal history of malignant neoplasm of unspecified site of lip, oral cavity, and pharynx: Secondary | ICD-10-CM | POA: Diagnosis not present

## 2014-06-10 DIAGNOSIS — Z87891 Personal history of nicotine dependence: Secondary | ICD-10-CM

## 2014-06-10 DIAGNOSIS — M069 Rheumatoid arthritis, unspecified: Secondary | ICD-10-CM | POA: Diagnosis present

## 2014-06-10 DIAGNOSIS — R221 Localized swelling, mass and lump, neck: Secondary | ICD-10-CM | POA: Diagnosis present

## 2014-06-10 DIAGNOSIS — R220 Localized swelling, mass and lump, head: Secondary | ICD-10-CM | POA: Diagnosis present

## 2014-06-10 MED ORDER — IOHEXOL 300 MG/ML  SOLN
80.0000 mL | Freq: Once | INTRAMUSCULAR | Status: AC | PRN
Start: 2014-06-10 — End: 2014-06-10
  Administered 2014-06-10: 80 mL via INTRAVENOUS

## 2014-06-11 ENCOUNTER — Encounter (HOSPITAL_COMMUNITY): Payer: Self-pay | Admitting: General Practice

## 2014-06-11 LAB — CBC WITH DIFFERENTIAL/PLATELET
BASOS ABS: 0.1 10*3/uL (ref 0.0–0.1)
Basophils Relative: 0 % (ref 0–1)
EOS ABS: 0.2 10*3/uL (ref 0.0–0.7)
Eosinophils Relative: 1 % (ref 0–5)
HCT: 30.8 % — ABNORMAL LOW (ref 36.0–46.0)
Hemoglobin: 10.3 g/dL — ABNORMAL LOW (ref 12.0–15.0)
LYMPHS ABS: 2.2 10*3/uL (ref 0.7–4.0)
LYMPHS PCT: 17 % (ref 12–46)
MCH: 32.3 pg (ref 26.0–34.0)
MCHC: 33.4 g/dL (ref 30.0–36.0)
MCV: 96.6 fL (ref 78.0–100.0)
Monocytes Absolute: 2.2 10*3/uL — ABNORMAL HIGH (ref 0.1–1.0)
Monocytes Relative: 17 % — ABNORMAL HIGH (ref 3–12)
Neutro Abs: 8.4 10*3/uL — ABNORMAL HIGH (ref 1.7–7.7)
Neutrophils Relative %: 65 % (ref 43–77)
PLATELETS: 580 10*3/uL — AB (ref 150–400)
RBC: 3.19 MIL/uL — AB (ref 3.87–5.11)
RDW: 13.9 % (ref 11.5–15.5)
WBC: 13 10*3/uL — AB (ref 4.0–10.5)

## 2014-06-11 LAB — COMPREHENSIVE METABOLIC PANEL
ALT: 25 U/L (ref 0–35)
AST: 34 U/L (ref 0–37)
Albumin: 3 g/dL — ABNORMAL LOW (ref 3.5–5.2)
Alkaline Phosphatase: 93 U/L (ref 39–117)
Anion gap: 13 (ref 5–15)
BUN: 10 mg/dL (ref 6–23)
CALCIUM: 9.1 mg/dL (ref 8.4–10.5)
CHLORIDE: 100 meq/L (ref 96–112)
CO2: 24 meq/L (ref 19–32)
CREATININE: 0.54 mg/dL (ref 0.50–1.10)
Glucose, Bld: 87 mg/dL (ref 70–99)
Potassium: 4.8 mEq/L (ref 3.7–5.3)
Sodium: 137 mEq/L (ref 137–147)
Total Bilirubin: 0.2 mg/dL — ABNORMAL LOW (ref 0.3–1.2)
Total Protein: 7.1 g/dL (ref 6.0–8.3)

## 2014-06-11 LAB — SURGICAL PCR SCREEN
MRSA, PCR: NEGATIVE
Staphylococcus aureus: NEGATIVE

## 2014-06-11 MED ORDER — OXYMETAZOLINE HCL 0.05 % NA SOLN
2.0000 | NASAL | Status: DC
  Filled 2014-06-11: qty 15

## 2014-06-11 MED ORDER — MORPHINE SULFATE 2 MG/ML IJ SOLN
INTRAMUSCULAR | Status: AC
  Filled 2014-06-11: qty 1

## 2014-06-11 MED ORDER — HYDROMORPHONE HCL PF 1 MG/ML IJ SOLN
1.0000 mg | INTRAMUSCULAR | Status: DC | PRN
  Administered 2014-06-11: 1 mg via INTRAVENOUS
  Administered 2014-06-11 – 2014-06-12 (×5): 2 mg via INTRAVENOUS
  Administered 2014-06-13: 1 mg via INTRAVENOUS
  Administered 2014-06-13: 2 mg via INTRAVENOUS
  Administered 2014-06-13: 1 mg via INTRAVENOUS
  Filled 2014-06-11 (×2): qty 2
  Filled 2014-06-11: qty 1
  Filled 2014-06-11: qty 2
  Filled 2014-06-11 (×2): qty 1
  Filled 2014-06-11 (×3): qty 2

## 2014-06-11 MED ORDER — MORPHINE SULFATE 2 MG/ML IJ SOLN
2.0000 mg | INTRAMUSCULAR | Status: DC | PRN
  Administered 2014-06-11 – 2014-06-13 (×2): 2 mg via INTRAVENOUS
  Filled 2014-06-11: qty 1

## 2014-06-11 MED ORDER — CLINDAMYCIN PHOSPHATE 600 MG/50ML IV SOLN
600.0000 mg | Freq: Four times a day (QID) | INTRAVENOUS | Status: DC
Start: 2014-06-11 — End: 2014-06-13
  Administered 2014-06-11 – 2014-06-13 (×5): 600 mg via INTRAVENOUS
  Filled 2014-06-11 (×9): qty 50

## 2014-06-11 MED ORDER — SODIUM CHLORIDE 0.9 % IJ SOLN
10.0000 mL | INTRAMUSCULAR | Status: DC | PRN
  Administered 2014-06-16 – 2014-06-18 (×4): 10 mL

## 2014-06-11 MED ORDER — HYDROMORPHONE HCL PF 1 MG/ML IJ SOLN
1.0000 mg | Freq: Once | INTRAMUSCULAR | Status: AC
  Administered 2014-06-11: 1 mg via INTRAMUSCULAR
  Filled 2014-06-11: qty 1

## 2014-06-11 MED ORDER — PROMETHAZINE HCL 25 MG PO TABS
12.5000 mg | ORAL_TABLET | Freq: Four times a day (QID) | ORAL | Status: DC | PRN
  Administered 2014-06-12: 12.5 mg via ORAL
  Filled 2014-06-11: qty 1

## 2014-06-11 MED ORDER — CLINDAMYCIN PHOSPHATE 600 MG/50ML IV SOLN
600.0000 mg | Freq: Four times a day (QID) | INTRAVENOUS | Status: DC
  Administered 2014-06-11: 600 mg via INTRAVENOUS
  Filled 2014-06-11 (×4): qty 50

## 2014-06-11 MED ORDER — KCL IN DEXTROSE-NACL 10-5-0.45 MEQ/L-%-% IV SOLN
INTRAVENOUS | Status: DC
  Administered 2014-06-11 – 2014-06-12 (×2): via INTRAVENOUS
  Filled 2014-06-11 (×8): qty 1000

## 2014-06-11 NOTE — Progress Notes (Signed)
Peripherally Inserted Central Catheter/Midline Placement  The IV Nurse has discussed with the patient and/or persons authorized to consent for the patient, the purpose of this procedure and the potential benefits and risks involved with this procedure.  The benefits include less needle sticks, lab draws from the catheter and patient may be discharged home with the catheter.  Risks include, but not limited to, infection, bleeding, blood clot (thrombus formation), and puncture of an artery; nerve damage and irregular heat beat.  Alternatives to this procedure were also discussed.  PICC/Midline Placement Documentation        Sandra Mann 06/11/2014, 3:23 PM

## 2014-06-11 NOTE — Consult Note (Signed)
          [facility]    Date of Admission:  06/11/2014          Reason for Consult: Left mandibular osteomyelitis with abscesses    Referring Physician: Dr. Lucien Mons   Active Problems:   Osteomyelitis of mandible   . clindamycin (CLEOCIN) IV  600 mg Intravenous 4 times per day    Recommendations: 1. Continue clindamycin   Assessment: I agree with empiric clindamycin therapy for now. Once specimens are available for Gram stain I will consider her further broadening of therapy pending final culture results. I will return to see her tomorrow.    HPI: Sandra Mann is a 63 y.o. female who underwent resection of oral cancer on May 27. Postoperatively she developed a left masseter muscle abscess. She did not improve with outpatient oral clindamycin therapy and was readmitted to the hospital for incision and drainage in late July. Operative cultures grew multiple organisms suggestive of mouth floor. She continued on clindamycin and improved but recently had increased pain and swelling. CT scan shows multiple small abscesses and bony erosion consistent with osteomyelitis.   Review of Systems: Review of systems not obtained due to patient factors.  Past Medical History  Diagnosis Date  . GERD (gastroesophageal reflux disease)   . Wears glasses   . Oral mass     left  . PONV (postoperative nausea and vomiting) 1988    "after tubes tied"  . Rheumatoid arteritis   . 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 Smoking status: Former Smoker -- 1.00 packs/day for 30 years    Types: Cigarettes    Quit date: 03/20/2000  . Smokeless tobacco: Never Used  . Alcohol Use: Yes     Comment: 05/28/2014 "drink a glass of wine ~ q night; none for the last week"    History reviewed. No pertinent family history. Allergies  Allergen Reactions  . Sulfa Antibiotics Nausea And Vomiting  . Codeine Nausea And Vomiting    OBJECTIVE: Blood pressure  117/63, pulse 81, temperature 98.3 F (36.8 C), temperature source Oral, resp. rate 16, SpO2 100.00%. General: She is currently having a PICC placed so I could not examine her  Lab Results Lab Results  Component Value Date   WBC 13.0* 06/11/2014   HGB 10.3* 06/11/2014   HCT 30.8* 06/11/2014   MCV 96.6 06/11/2014   PLT 580* 06/11/2014    Lab Results  Component Value Date   CREATININE 0.61 05/28/2014   BUN 10 05/28/2014   NA 137 05/28/2014   K 4.0 05/28/2014   CL 99 05/28/2014   CO2 26 05/28/2014    Lab Results  Component Value Date   ALT 46* 05/28/2014   AST 45* 05/28/2014   ALKPHOS 88 05/28/2014   BILITOT 0.3 05/28/2014     Microbiology: No results found for this or any previous visit (from the past 240 hour(s)).  Michel Bickers, MD [facility] [place] [facility] [phone] pager   [phone] cell 06/11/2014, 3:36 PM

## 2014-06-11 NOTE — H&P (Signed)
Sandra Mann is an 63 y.o. female.   Chief Complaint: facial swelling HPI: history of oral cancer removed about 3 months ago. She has had recurrent mandibular swelling. She had incision and drainage of masticator abscess a couple weeks ago and was doing well until Friday when she started swelling again. She has been on clindamycin.  Past Medical History  Diagnosis Date  . GERD (gastroesophageal reflux disease)   . Wears glasses   . Oral mass     left  . PONV (postoperative nausea and vomiting) 1988    "after tubes tied"  . Rheumatoid arteritis   . 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 Colonoscopy    . Finger ganglion cyst excision Right 2007    small dip  . Multiple extractions with alveoloplasty Left 03/27/2014    Procedure: EXTRACTIONS OF NECESSARY TOOTH ;  Surgeon: Ceasar Mons, DDS;  Location: [place];  Service: Oral Surgery;  Laterality: Left;  . Excision oral tumor Left 03/27/2014    Procedure: EXCISION OF ORAL CANCER;  Surgeon: Izora Gala, MD;  Location: [place];  Service: ENT;  Laterality: Left;  . Tubal ligation  ~ 1988  . Incision and drainage of peritonsillar abcess Left 06/01/2014    Procedure: INCISION AND DRAINAGE Masticator Space Infection;  Surgeon: Izora Gala, MD;  Location: [place];  Service: ENT;  Laterality: Left;    No family history on file. Social History:  reports that she quit smoking about 14 years ago. Her smoking use included Cigarettes. She has a 30 pack-year smoking history. She has never used smokeless tobacco. She reports that she drinks alcohol. She reports that she does not use illicit drugs.  Allergies:  Allergies  Allergen Reactions  . Sulfa Antibiotics Nausea And Vomiting  . Codeine Nausea And Vomiting    No prescriptions prior to admission    No results found for this or any previous visit (from the past 48 hour(s)). Ct Maxillofacial W/cm  06/10/2014   CLINICAL  DATA:  Oral cancer involving the left posterior mandibular alveolar tissue. Resected 03/27/2014. Persistent pain. Abnormal MRI.  EXAM: CT MAXILLOFACIAL WITH CONTRAST  TECHNIQUE: Multidetector CT imaging of the maxillofacial structures was performed with intravenous contrast. Multiplanar CT image reconstructions were also generated. A small metallic BB was placed on the right temple in order to reliably differentiate right from left.  CONTRAST:  35m OMNIPAQUE IOHEXOL 300 MG/ML  SOLN  COMPARISON:  MRI 05/31/2014  FINDINGS: This patient is had partial resection of the body of the mandible on the left with the resection involving the alveolar ridge and medial cortex. The segment upper section measures about 2.7 cm. There is a moth-eaten appearance of the mandible affecting the lateral cortex in the region of for section as well as the bone more posteriorly in the region of the angle of the mandible and coronoid process, consistent with osteomyelitis. One could not exclude the possibility of tumor infiltration.  Nonenhancing abscess within the temporalis muscle adjacent to the coronoid process on the left has enlarged, now measuring 17 mm in diameter (previously 12 mm). New abscess is present lateral to the angle of the mandible, pressing into the masseter muscle, measuring 25 mm in diameter. No extension at out of the masticator space.  Visualized intracranial contents are unremarkable. Visualized sinuses, middle ears and mastoids are clear. No airway compromise.  IMPRESSION: Worsening of left masticator space abscesses. Abscess within the temporalis  muscle adjacent to the coronoid process on the left has enlarged from 12 mm to 17 mm. Development of a separate abscess lateral to the ankle of the mandible extending into the masseter muscle measuring 25 mm in diameter.   Electronically Signed   By: Nelson Chimes M.D.   On: 06/10/2014 18:06    ROS: otherwise negative  There were no vitals taken for this  visit.  PHYSICAL EXAM: Overall appearance:  Healthy appearing, in obvious discomfort Head:  Normocephalic, atraumatic. Ears: External ears are normal. Nose: External nose is healthy in appearance. Internal nasal exam free of any lesions or obstruction. Oral Cavity/pharynx:  There is severe trismus. There is severe edema of the left posterior buccal mucosa and retromolar area. Neuro:  No identifiable neurologic deficits. Neck: severe swelling and tenderness over the angle of the mandible extending down into the upper neck.  Studies Reviewed: maxillofacial CT    Assessment/Plan Admit for intravenous antibiotics. We will have a PICC line inserted as she will likely need prolonged IV antibiotics. We'll schedule tomorrow for incision and drainage of the abscess.  Bethel Gaglio 06/11/2014, 9:44 AM

## 2014-06-12 ENCOUNTER — Encounter (HOSPITAL_COMMUNITY): Payer: Self-pay | Admitting: Anesthesiology

## 2014-06-12 ENCOUNTER — Inpatient Hospital Stay (HOSPITAL_COMMUNITY): Payer: 59 | Admitting: Anesthesiology

## 2014-06-12 ENCOUNTER — Encounter (HOSPITAL_COMMUNITY): Payer: 59 | Admitting: Anesthesiology

## 2014-06-12 ENCOUNTER — Inpatient Hospital Stay (HOSPITAL_COMMUNITY): Admission: RE | Admit: 2014-06-12 | Payer: 59 | Source: Ambulatory Visit | Admitting: Otolaryngology

## 2014-06-12 ENCOUNTER — Encounter (HOSPITAL_COMMUNITY)
Admission: AD | Disposition: A | Discharge status: Home / Self Care | Payer: Self-pay | Source: Ambulatory Visit | Attending: Otolaryngology

## 2014-06-12 DIAGNOSIS — M272 Inflammatory conditions of jaws: Principal | ICD-10-CM

## 2014-06-12 HISTORY — PX: INCISION AND DRAINAGE ABSCESS: SHX5864

## 2014-06-12 SURGERY — INCISION AND DRAINAGE, ABSCESS
Anesthesia: General | Site: Mouth | Laterality: Left

## 2014-06-12 MED ORDER — BACITRACIN ZINC 500 UNIT/GM EX OINT
TOPICAL_OINTMENT | CUTANEOUS | Status: AC
  Filled 2014-06-12: qty 15

## 2014-06-12 MED ORDER — CLINDAMYCIN HCL 300 MG PO CAPS
300.0000 mg | ORAL_CAPSULE | Freq: Four times a day (QID) | ORAL | Status: DC
  Administered 2014-06-12: 300 mg via ORAL
  Filled 2014-06-12 (×7): qty 1

## 2014-06-12 MED ORDER — ONDANSETRON HCL 4 MG/2ML IJ SOLN
INTRAMUSCULAR | Status: DC | PRN
  Administered 2014-06-12: 4 mg via INTRAVENOUS

## 2014-06-12 MED ORDER — IBUPROFEN 600 MG PO TABS
600.0000 mg | ORAL_TABLET | Freq: Four times a day (QID) | ORAL | Status: DC | PRN
  Administered 2014-06-14 – 2014-06-18 (×6): 600 mg via ORAL
  Filled 2014-06-12 (×7): qty 1

## 2014-06-12 MED ORDER — LIDOCAINE-EPINEPHRINE 1 %-1:100000 IJ SOLN
INTRAMUSCULAR | Status: AC
  Filled 2014-06-12: qty 1

## 2014-06-12 MED ORDER — LIDOCAINE HCL (CARDIAC) 20 MG/ML IV SOLN
INTRAVENOUS | Status: DC | PRN
  Administered 2014-06-12: 80 mg via INTRAVENOUS

## 2014-06-12 MED ORDER — OXYCODONE-ACETAMINOPHEN 5-325 MG PO TABS
1.0000 | ORAL_TABLET | Freq: Four times a day (QID) | ORAL | Status: DC | PRN
  Administered 2014-06-13 – 2014-06-16 (×6): 1 via ORAL
  Filled 2014-06-12 (×6): qty 1

## 2014-06-12 MED ORDER — HYDROMORPHONE HCL PF 1 MG/ML IJ SOLN
0.2500 mg | INTRAMUSCULAR | Status: DC | PRN
  Administered 2014-06-12 (×2): 0.5 mg via INTRAVENOUS

## 2014-06-12 MED ORDER — LACTATED RINGERS IV SOLN: INTRAVENOUS | Status: DC

## 2014-06-12 MED ORDER — ONDANSETRON HCL 4 MG PO TABS
4.0000 mg | ORAL_TABLET | Freq: Three times a day (TID) | ORAL | Status: DC | PRN
  Administered 2014-06-12: 4 mg via ORAL
  Filled 2014-06-12: qty 1

## 2014-06-12 MED ORDER — MEPERIDINE HCL 25 MG/ML IJ SOLN: 6.2500 mg | INTRAMUSCULAR | Status: DC | PRN

## 2014-06-12 MED ORDER — OXYCODONE HCL 5 MG PO TABS: 5.0000 mg | ORAL_TABLET | Freq: Once | ORAL | Status: DC | PRN

## 2014-06-12 MED ORDER — CYCLOSPORINE 0.05 % OP EMUL
1.0000 [drp] | Freq: Two times a day (BID) | OPHTHALMIC | Status: DC
  Administered 2014-06-12 – 2014-06-18 (×12): 1 [drp] via OPHTHALMIC
  Filled 2014-06-12 (×13): qty 1

## 2014-06-12 MED ORDER — HYDROMORPHONE HCL PF 1 MG/ML IJ SOLN
INTRAMUSCULAR | Status: AC
  Filled 2014-06-12: qty 1

## 2014-06-12 MED ORDER — SUCCINYLCHOLINE CHLORIDE 20 MG/ML IJ SOLN
INTRAMUSCULAR | Status: DC | PRN
Start: 2014-06-12 — End: 2014-06-12
  Administered 2014-06-12: 100 mg via INTRAVENOUS

## 2014-06-12 MED ORDER — PANTOPRAZOLE SODIUM 40 MG PO TBEC
40.0000 mg | DELAYED_RELEASE_TABLET | Freq: Every day | ORAL | Status: DC
  Administered 2014-06-12 – 2014-06-18 (×7): 40 mg via ORAL
  Filled 2014-06-12 (×7): qty 1

## 2014-06-12 MED ORDER — METRONIDAZOLE 500 MG PO TABS
500.0000 mg | ORAL_TABLET | Freq: Four times a day (QID) | ORAL | Status: DC
  Administered 2014-06-12: 500 mg via ORAL
  Filled 2014-06-12: qty 1

## 2014-06-12 MED ORDER — LACTATED RINGERS IV SOLN
INTRAVENOUS | Status: DC | PRN
  Administered 2014-06-12: 14:00:00 via INTRAVENOUS

## 2014-06-12 MED ORDER — FENTANYL CITRATE 0.05 MG/ML IJ SOLN
INTRAMUSCULAR | Status: DC | PRN
  Administered 2014-06-12: 50 ug via INTRAVENOUS
  Administered 2014-06-12: 100 ug via INTRAVENOUS
  Administered 2014-06-12 (×2): 50 ug via INTRAVENOUS

## 2014-06-12 MED ORDER — HYDROCODONE-ACETAMINOPHEN 7.5-325 MG PO TABS
1.0000 | ORAL_TABLET | Freq: Four times a day (QID) | ORAL | Status: DC | PRN
  Administered 2014-06-14 – 2014-06-17 (×8): 1 via ORAL
  Filled 2014-06-12 (×9): qty 1

## 2014-06-12 MED ORDER — ADULT MULTIVITAMIN W/MINERALS CH
1.0000 | ORAL_TABLET | Freq: Every day | ORAL | Status: DC
  Administered 2014-06-12 – 2014-06-18 (×4): 1 via ORAL
  Filled 2014-06-12 (×7): qty 1

## 2014-06-12 MED ORDER — DEXTROSE-NACL 5-0.9 % IV SOLN
INTRAVENOUS | Status: DC
  Administered 2014-06-12 – 2014-06-15 (×4): via INTRAVENOUS
  Administered 2014-06-15: 1000 mL via INTRAVENOUS

## 2014-06-12 MED ORDER — ONDANSETRON HCL 4 MG/2ML IJ SOLN: 4.0000 mg | Freq: Once | INTRAMUSCULAR | Status: DC | PRN

## 2014-06-12 MED ORDER — PROMETHAZINE HCL 25 MG RE SUPP
25.0000 mg | Freq: Four times a day (QID) | RECTAL | Status: DC | PRN
  Administered 2014-06-13: 25 mg via RECTAL
  Filled 2014-06-12: qty 1

## 2014-06-12 MED ORDER — VALACYCLOVIR HCL 500 MG PO TABS
500.0000 mg | ORAL_TABLET | Freq: Two times a day (BID) | ORAL | Status: DC | PRN
  Filled 2014-06-12: qty 1

## 2014-06-12 MED ORDER — OXYCODONE HCL 5 MG/5ML PO SOLN: 5.0000 mg | Freq: Once | ORAL | Status: DC | PRN

## 2014-06-12 MED ORDER — PROPOFOL 10 MG/ML IV BOLUS
INTRAVENOUS | Status: DC | PRN
  Administered 2014-06-12: 120 mg via INTRAVENOUS

## 2014-06-12 MED ORDER — TRAMADOL HCL 50 MG PO TABS
50.0000 mg | ORAL_TABLET | Freq: Four times a day (QID) | ORAL | Status: DC | PRN
  Filled 2014-06-12: qty 1

## 2014-06-12 MED ORDER — MESALAMINE 1000 MG RE SUPP
1000.0000 mg | Freq: Every day | RECTAL | Status: DC
  Administered 2014-06-12 – 2014-06-17 (×6): 1000 mg via RECTAL
  Filled 2014-06-12 (×7): qty 1

## 2014-06-12 MED ORDER — 0.9 % SODIUM CHLORIDE (POUR BTL) OPTIME
TOPICAL | Status: DC | PRN
  Administered 2014-06-12: 1000 mL

## 2014-06-12 MED ORDER — FOLIC ACID 1 MG PO TABS
1.0000 mg | ORAL_TABLET | Freq: Every day | ORAL | Status: DC
  Administered 2014-06-12 – 2014-06-18 (×7): 1 mg via ORAL
  Filled 2014-06-12 (×7): qty 1

## 2014-06-12 SURGICAL SUPPLY — 44 items
BNDG CONFORM 2 STRL LF (GAUZE/BANDAGES/DRESSINGS) IMPLANT
BNDG GAUZE ELAST 4 BULKY (GAUZE/BANDAGES/DRESSINGS) IMPLANT
CANISTER SUCTION 2500CC (MISCELLANEOUS) IMPLANT
CATH ROBINSON RED A/P 16FR (CATHETERS) IMPLANT
CLEANER TIP ELECTROSURG 2X2 (MISCELLANEOUS) ×2 IMPLANT
CONT SPEC 4OZ CLIKSEAL STRL BL (MISCELLANEOUS) ×2 IMPLANT
CORDS BIPOLAR (ELECTRODE) ×2 IMPLANT
COVER SURGICAL LIGHT HANDLE (MISCELLANEOUS) ×2 IMPLANT
DRAIN PENROSE 1/4X12 LTX STRL (WOUND CARE) IMPLANT
DRSG EMULSION OIL 3X3 NADH (GAUZE/BANDAGES/DRESSINGS) IMPLANT
ELECT COATED BLADE 2.86 ST (ELECTRODE) ×2 IMPLANT
ELECT NEEDLE TIP 2.8 STRL (NEEDLE) IMPLANT
ELECT REM PT RETURN 9FT ADLT (ELECTROSURGICAL) ×2
ELECTRODE REM PT RTRN 9FT ADLT (ELECTROSURGICAL) ×1 IMPLANT
FORCEPS BIPOLAR SPETZLER 8 1.0 (NEUROSURGERY SUPPLIES) ×2 IMPLANT
GAUZE PACKING IODOFORM 1/4X5 (PACKING) IMPLANT
GAUZE SPONGE 4X4 12PLY STRL (GAUZE/BANDAGES/DRESSINGS) ×2 IMPLANT
GAUZE SPONGE 4X4 16PLY XRAY LF (GAUZE/BANDAGES/DRESSINGS) ×2 IMPLANT
GLOVE BIOGEL PI IND STRL 7.0 (GLOVE) ×2 IMPLANT
GLOVE BIOGEL PI INDICATOR 7.0 (GLOVE) ×2
GLOVE ECLIPSE 7.5 STRL STRAW (GLOVE) ×2 IMPLANT
GLOVE SURG SS PI 6.5 STRL IVOR (GLOVE) ×2 IMPLANT
GOWN STRL REUS W/ TWL LRG LVL3 (GOWN DISPOSABLE) ×2 IMPLANT
GOWN STRL REUS W/TWL LRG LVL3 (GOWN DISPOSABLE) ×2
KIT BASIN OR (CUSTOM PROCEDURE TRAY) ×2 IMPLANT
KIT ROOM TURNOVER OR (KITS) ×2 IMPLANT
NEEDLE HYPO 30X.5 LL (NEEDLE) ×2 IMPLANT
NS IRRIG 1000ML POUR BTL (IV SOLUTION) ×2 IMPLANT
PAD ARMBOARD 7.5X6 YLW CONV (MISCELLANEOUS) ×4 IMPLANT
PENCIL FOOT CONTROL (ELECTRODE) ×2 IMPLANT
POUCH STERILIZING 3 X22 (STERILIZATION PRODUCTS) IMPLANT
SUT CHROMIC 4 0 P 3 18 (SUTURE) ×2 IMPLANT
SUT ETHILON 4 0 PS 2 18 (SUTURE) ×2 IMPLANT
SUT ETHILON 5 0 P 3 18 (SUTURE) ×1
SUT NYLON ETHILON 5-0 P-3 1X18 (SUTURE) ×1 IMPLANT
SUT SILK 4 0 REEL (SUTURE) ×2 IMPLANT
SWAB COLLECTION DEVICE MRSA (MISCELLANEOUS) ×2 IMPLANT
SYR BULB IRRIGATION 50ML (SYRINGE) IMPLANT
SYR CONTROL 10ML LL (SYRINGE) ×2 IMPLANT
TOWEL OR 17X24 6PK STRL BLUE (TOWEL DISPOSABLE) ×2 IMPLANT
TOWEL OR 17X26 10 PK STRL BLUE (TOWEL DISPOSABLE) ×2 IMPLANT
TRAY ENT MC OR (CUSTOM PROCEDURE TRAY) ×2 IMPLANT
TUBE ANAEROBIC SPECIMEN COL (MISCELLANEOUS) ×2 IMPLANT
YANKAUER SUCT BULB TIP NO VENT (SUCTIONS) IMPLANT

## 2014-06-12 NOTE — Anesthesia Procedure Notes (Signed)
Procedure Name: Intubation Date/Time: 06/12/2014 2:34 PM Performed by: Rebekah Chesterfield L Pre-anesthesia Checklist: Patient identified, Emergency Drugs available, Suction available, Patient being monitored and Timeout performed Patient Re-evaluated:Patient Re-evaluated prior to inductionOxygen Delivery Method: Circle system utilized Preoxygenation: Pre-oxygenation with 100% oxygen Intubation Type: IV induction Ventilation: Mask ventilation without difficulty Grade View: Grade I Tube type: Oral Number of attempts: 1 Airway Equipment and Method: Stylet and Video-laryngoscopy Placement Confirmation: ETT inserted through vocal cords under direct vision,  positive ETCO2 and breath sounds checked- equal and bilateral Secured at: 20 cm Tube secured with: Tape Dental Injury: Teeth and Oropharynx as per pre-operative assessment  Difficulty Due To: Difficulty was anticipated, Difficult Airway- due to limited oral opening and Difficult Airway-  due to edematous airway Future Recommendations: Recommend- induction with short-acting agent, and alternative techniques readily available Comments: Pt with large submandibular mass/abcess  Severely limited mouth opening , barely ably to get glide scope between teeth. scope atraumatically eased into airway, good view obtained, ett guided through vc s probs

## 2014-06-12 NOTE — Anesthesia Preprocedure Evaluation (Signed)
Anesthesia Evaluation  Patient identified by MRN, date of birth, ID band Patient awake    Reviewed: Allergy & Precautions, H&P , NPO status , Patient's Chart, lab work & pertinent test results  History of Anesthesia Complications (+) PONV  Airway Mallampati: I TM Distance: >3 FB Neck ROM: Full    Dental   Pulmonary former smoker,          Cardiovascular     Neuro/Psych    GI/Hepatic   Endo/Other    Renal/GU      Musculoskeletal   Abdominal   Peds  Hematology   Anesthesia Other Findings   Reproductive/Obstetrics                           Anesthesia Physical Anesthesia Plan  ASA: II  Anesthesia Plan: General   Post-op Pain Management:    Induction: Intravenous  Airway Management Planned: Oral ETT  Additional Equipment:   Intra-op Plan:   Post-operative Plan: Extubation in OR  Informed Consent: I have reviewed the patients History and Physical, chart, labs and discussed the procedure including the risks, benefits and alternatives for the proposed anesthesia with the patient or authorized representative who has indicated his/her understanding and acceptance.     Plan Discussed with: CRNA and Surgeon  Anesthesia Plan Comments: (Pt has trismus with deminished mouth opening. Will relax with induction of anesthesia and may need glidscope. Plan induction of GA and oral or nasal intubation.)        Anesthesia Quick Evaluation

## 2014-06-12 NOTE — Anesthesia Postprocedure Evaluation (Signed)
Anesthesia Post Note  Patient: Sandra Mann  Procedure(s) Performed: Procedure(s) (LRB): LEFT INCISION AND DRAINAGE MANDIBULAR ABSCESS (Left)  Anesthesia type: general  Patient location: PACU  Post pain: Pain level controlled  Post assessment: Patient's Cardiovascular Status Stable  Last Vitals:  Filed Vitals:   06/12/14 1634  BP: 135/57  Pulse: 70  Temp: 37.9 C  Resp: 18    Post vital signs: Reviewed and stable  Level of consciousness: sedated  Complications: No apparent anesthesia complications

## 2014-06-12 NOTE — Care Management Note (Signed)
  Page 1 of 1   06/12/2014     10:45:16 AM CARE MANAGEMENT NOTE 06/12/2014  Patient:  EMIL, KLASSEN   Account Number:  [id number]  Date Initiated:  06/12/2014  Documentation initiated by:  Magdalen Spatz  Subjective/Objective Assessment:     Action/Plan:   Anticipated DC Date:     Anticipated DC Plan:  [place]         Choice offered to / List presented to:  C-1 Patient           Status of service:   Medicare Important Message given?   (If response is "NO", the following Medicare IM given date fields will be blank) Date Medicare IM given:   Medicare IM given by:   Date Additional Medicare IM given:   Additional Medicare IM given by:    Discharge Disposition:    Per UR Regulation:    If discussed at Long Length of Stay Meetings, dates discussed:    Comments:  06-12-14 Plan to discharge home with PICC and IV Antibiotics . Confirmed face sheet information with patient and her husband .  They would like [facility] . Will need order for home health RN and prescription for IV antibiotics . Thanks Magdalen Spatz RN BSN [phone]

## 2014-06-12 NOTE — Progress Notes (Signed)
Patient ID: Sandra Mann, female   DOB: [birth year], 63 y.o.   MRN: [id number]         [facility]    Date of Admission:  06/11/2014     Active Problems:   Osteomyelitis of mandible   . clindamycin (CLEOCIN) IV  600 mg Intravenous Q6H  . oxymetazoline  2 spray Each Nare 30 min Pre-Op    Subjective: She developed an abscess in the left masseter muscle in late July and underwent incision and drainage. Abscess cultures grew multiple bacteria. His clindamycin since just before that surgery. She improved initially after abscess drainage but began having increased left jaw swelling and pain 5 days ago leading to readmission yesterday. She has not had any fever, chills or sweats.   Past Medical History  Diagnosis Date  . GERD (gastroesophageal reflux disease)   . Wears glasses   . Oral mass     left  . PONV (postoperative nausea and vomiting) 1988    "after tubes tied"  . Rheumatoid arteritis   . 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 Smoking status: Former Smoker -- 1.00 packs/day for 30 years    Types: Cigarettes    Quit date: 03/20/2000  . Smokeless tobacco: Never Used  . Alcohol Use: Yes     Comment: 05/28/2014 "drink a glass of wine ~ q night; none for the last week"    History reviewed. No pertinent family history.  Allergies  Allergen Reactions  . Sulfa Antibiotics Nausea And Vomiting  . Codeine Nausea And Vomiting    Objective: Temp:  [98.3 F (36.8 C)-100 F (37.8 C)] 98.3 F (36.8 C) (08/12 0629) Pulse Rate:  [68-89] 68 (08/12 0629) Resp:  [16-18] 18 (08/12 0629) BP: (107-131)/(59-72) 131/59 mmHg (08/12 0629) SpO2:  [97 %-100 %] 97 % (08/12 0629)  General: She is upset and tearful at times Skin: No rash Lungs: Clear Cor: Regular S1 and S2 with no murmurs Abdomen: Nontender Prominent swelling over the left angle of the jaw  Lab Results Lab Results  Component Value Date   WBC 13.0* 06/11/2014   HGB 10.3*  06/11/2014   HCT 30.8* 06/11/2014   MCV 96.6 06/11/2014   PLT 580* 06/11/2014    Lab Results  Component Value Date   CREATININE 0.54 06/11/2014   BUN 10 06/11/2014   NA 137 06/11/2014   K 4.8 06/11/2014   CL 100 06/11/2014   CO2 24 06/11/2014    Lab Results  Component Value Date   ALT 25 06/11/2014   AST 34 06/11/2014   ALKPHOS 93 06/11/2014   BILITOT 0.2* 06/11/2014      Microbiology: Recent Results (from the past 240 hour(s))  SURGICAL PCR SCREEN     Status: None   Collection Time    06/11/14  6:35 PM      Result Value Ref Range Status   MRSA, PCR NEGATIVE  NEGATIVE Final   Staphylococcus aureus NEGATIVE  NEGATIVE Final   Comment:            The Xpert SA Assay (FDA     approved for NASAL specimens     in patients over 79 years of age),     is one component of     a comprehensive surveillance     program.  Test performance has     been validated by [employer] for patients greater  than or equal to 36 year old.     It is not intended     to diagnose infection nor to     guide or monitor treatment.    Studies/Results: Ct Maxillofacial W/cm  06/10/2014   CLINICAL DATA:  Oral cancer involving the left posterior mandibular alveolar tissue. Resected 03/27/2014. Persistent pain. Abnormal MRI.  EXAM: CT MAXILLOFACIAL WITH CONTRAST  TECHNIQUE: Multidetector CT imaging of the maxillofacial structures was performed with intravenous contrast. Multiplanar CT image reconstructions were also generated. A small metallic BB was placed on the right temple in order to reliably differentiate right from left.  CONTRAST:  97mL OMNIPAQUE IOHEXOL 300 MG/ML  SOLN  COMPARISON:  MRI 05/31/2014  FINDINGS: This patient is had partial resection of the body of the mandible on the left with the resection involving the alveolar ridge and medial cortex. The segment upper section measures about 2.7 cm. There is a moth-eaten appearance of the mandible affecting the lateral cortex in the region of for section  as well as the bone more posteriorly in the region of the angle of the mandible and coronoid process, consistent with osteomyelitis. One could not exclude the possibility of tumor infiltration.  Nonenhancing abscess within the temporalis muscle adjacent to the coronoid process on the left has enlarged, now measuring 17 mm in diameter (previously 12 mm). New abscess is present lateral to the angle of the mandible, pressing into the masseter muscle, measuring 25 mm in diameter. No extension at out of the masticator space.  Visualized intracranial contents are unremarkable. Visualized sinuses, middle ears and mastoids are clear. No airway compromise.  IMPRESSION: Worsening of left masticator space abscesses. Abscess within the temporalis muscle adjacent to the coronoid process on the left has enlarged from 12 mm to 17 mm. Development of a separate abscess lateral to the ankle of the mandible extending into the masseter muscle measuring 25 mm in diameter.   Electronically Signed   By: Nelson Chimes M.D.   On: 06/10/2014 18:06    Assessment: She has worsening soft tissue abscesses in early osteomyelitis unresponsive to recent incision and drainage and 3 weeks of clindamycin. After operative specimens are obtained today I will broaden her antibiotic therapy to IV ertapenem pending operative culture results.  Plan: 1. Change clindamycin to ertapenem 1 g IV daily postoperatively  Michel Bickers, MD [facility] for [facility] [place] [phone] pager   [phone] cell 06/12/2014, 1:08 PM

## 2014-06-12 NOTE — Transfer of Care (Signed)
Immediate Anesthesia Transfer of Care Note  Patient: Sandra Mann  Procedure(s) Performed: Procedure(s): LEFT INCISION AND DRAINAGE MANDIBULAR ABSCESS (Left)  Patient Location: PACU  Anesthesia Type:General  Level of Consciousness: awake, alert , oriented and patient cooperative  Airway & Oxygen Therapy: Patient Spontanous Breathing and Patient connected to nasal cannula oxygen  Post-op Assessment: Report given to PACU RN, Post -op Vital signs reviewed and stable and Patient moving all extremities  Post vital signs: Reviewed and stable  Complications: No apparent anesthesia complications

## 2014-06-12 NOTE — Op Note (Signed)
OPERATIVE REPORT  DATE OF SURGERY: 06/12/2014  PATIENT:  Sandra Mann,  63 y.o. female  PRE-OPERATIVE DIAGNOSIS:  OSTEOMYLITIS OF THE MANDIBLE  POST-OPERATIVE DIAGNOSIS:  OSTEOMYLITIS OF THE MANDIBLE  PROCEDURE:  Procedure(s): LEFT INCISION AND DRAINAGE MANDIBULAR ABSCESS  SURGEON:  Beckie Salts, MD  ASSISTANTS: none  ANESTHESIA:   General   EBL:  25 ml  DRAINS: 2 1/2 inch Penrose - #1 anterior buccal space; #2 posterior peri-mandibular space  LOCAL MEDICATIONS USED:  None  SPECIMEN:  Cultures  COUNTS:  Correct  PROCEDURE DETAILS: The patient was taken to the operating room and placed on the operating table in the supine position. Following induction of general endotracheal anesthesia, the left neck was prepped and draped in a standard fashion. An incision was outlined in the left neck in a skin crease about 2 finger breadths below the mandible. Electrocautery was used to incise the skin and subcutaneous tissue. The platysma was divided. Sub-platysmal flap was developed superiorly for about 3 cm. A long hemostat was used to dissect down to the mandible and then track along the surface of the mandible superiorly, until the abscess cavity was identified. Thick pus was obtained and samples were sent for culture. Additional dissection up towards the coronoid and zygoma opened into the remainder of the abscess cavity. Suction was used to clean out all debris. Penrose drains were placed in the abscess cavity one anteriorly and one posteriorly all the way up to the level of the zygomatic arch.These were secured with 2-0 Nylon sutures and a sterile safety pin. Tubular guaze dressing was placed around the neck with 4x4 guaze for a dressing. She was awakened, extubated and transferred to PACU stable.    PATIENT DISPOSITION:  To PACU, stable

## 2014-06-12 NOTE — Interval H&P Note (Signed)
History and Physical Interval Note:  06/12/2014 1:55 PM  Sandra Mann  has presented today for surgery, with the diagnosis of OSTEOMYLITIS OF THE MANDIBLE  The various methods of treatment have been discussed with the patient and family. After consideration of risks, benefits and other options for treatment, the patient has consented to  Procedure(s): LEFT INCISION AND DRAINAGE MANDIBULAR ABSCESS (Left) as a surgical intervention .  The patient's history has been reviewed, patient examined, no change in status, stable for surgery.  I have reviewed the patient's chart and labs.  Questions were answered to the patient's satisfaction.     Juvia Aerts

## 2014-06-13 ENCOUNTER — Encounter (HOSPITAL_COMMUNITY): Payer: Self-pay | Admitting: Otolaryngology

## 2014-06-13 DIAGNOSIS — B9689 Other specified bacterial agents as the cause of diseases classified elsewhere: Secondary | ICD-10-CM

## 2014-06-13 MED ORDER — SODIUM CHLORIDE 0.9 % IV SOLN
1.0000 g | INTRAVENOUS | Status: DC
  Administered 2014-06-13 – 2014-06-18 (×6): 1 g via INTRAVENOUS
  Filled 2014-06-13 (×6): qty 1

## 2014-06-13 MED ORDER — VANCOMYCIN HCL IN DEXTROSE 750-5 MG/150ML-% IV SOLN
750.0000 mg | Freq: Three times a day (TID) | INTRAVENOUS | Status: DC
  Administered 2014-06-13: 750 mg via INTRAVENOUS
  Filled 2014-06-13 (×2): qty 150

## 2014-06-13 MED ORDER — METRONIDAZOLE IN NACL 5-0.79 MG/ML-% IV SOLN
500.0000 mg | Freq: Four times a day (QID) | INTRAVENOUS | Status: DC
  Administered 2014-06-13 (×3): 500 mg via INTRAVENOUS
  Filled 2014-06-13 (×5): qty 100

## 2014-06-13 MED ORDER — ACETAMINOPHEN 650 MG RE SUPP
650.0000 mg | Freq: Four times a day (QID) | RECTAL | Status: DC | PRN
  Administered 2014-06-13: 650 mg via RECTAL
  Filled 2014-06-13 (×2): qty 1

## 2014-06-13 MED ORDER — VANCOMYCIN HCL IN DEXTROSE 750-5 MG/150ML-% IV SOLN
750.0000 mg | Freq: Two times a day (BID) | INTRAVENOUS | Status: DC
  Administered 2014-06-14: 750 mg via INTRAVENOUS
  Filled 2014-06-13 (×2): qty 150

## 2014-06-13 NOTE — Progress Notes (Signed)
Subjective: She feels a little bit better today.  Objective: Vital signs in last 24 hours: Temp:  [97.3 F (36.3 C)-101.8 F (38.8 C)] 98.2 F (36.8 C) (08/13 0539) Pulse Rate:  [67-91] 71 (08/13 0539) Resp:  [12-23] 16 (08/13 0539) BP: (100-154)/(46-89) 100/46 mmHg (08/13 0539) SpO2:  [92 %-100 %] 99 % (08/13 0539) Weight change:  Last BM Date:  (pt unsure)  Intake/Output from previous day: 08/12 0701 - 08/13 0700 In: 1036 [I.V.:1036] Out: 2075 [Urine:2075] Intake/Output this shift: Total I/O In: 513 [I.V.:513] Out: -   PHYSICAL EXAM: She's much less sedated today. Trismus slightly improved. Drains in place.  Lab Results:  Recent Labs  06/11/14 1435  WBC 13.0*  HGB 10.3*  HCT 30.8*  PLT 580*   BMET  Recent Labs  06/11/14 1435  NA 137  K 4.8  CL 100  CO2 24  GLUCOSE 87  BUN 10  CREATININE 0.54  CALCIUM 9.1    Studies/Results: No results found.  Medications: I have reviewed the patient's current medications.  Assessment/Plan: Postop day 1. Continue IV antibiotics as recommended by infectious disease.  LOS: 2 days   Christalyn Goertz 06/13/2014, 8:28 AM

## 2014-06-13 NOTE — Progress Notes (Signed)
Patient ID: Sandra Mann, female   DOB: Jul 06, 1951, 63 y.o.   MRN: [id number]         [facility]    Date of Admission:  06/11/2014           Day 1 ertapenem        Day 1 metronidazole  Active Problems:   Osteomyelitis of mandible   . cycloSPORINE  1 drop Both Eyes BID  . ertapenem  1 g Intravenous Q24H  . folic acid  1 mg Oral Daily  . mesalamine  1,000 mg Rectal QHS  . metronidazole  500 mg Intravenous Q6H  . multivitamin with minerals  1 tablet Oral Daily  . pantoprazole  40 mg Oral Daily    Subjective: She still has intermittent shooting pains in her left jaw but is feeling a little bit better after surgery. He did have fever and a night sweat last night.   Past Medical History  Diagnosis Date  . GERD (gastroesophageal reflux disease)   . Wears glasses   . Oral mass     left  . PONV (postoperative nausea and vomiting) 1988    "after tubes tied"  . Rheumatoid arteritis   . 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 Smoking status: Former Smoker -- 1.00 packs/day for 30 years    Types: Cigarettes    Quit date: 03/20/2000  . Smokeless tobacco: Never Used  . Alcohol Use: Yes     Comment: 05/28/2014 "drink a glass of wine ~ q night; none for the last week"    History reviewed. No pertinent family history.  Allergies  Allergen Reactions  . Sulfa Antibiotics Nausea And Vomiting  . Codeine Nausea And Vomiting    Objective: Temp:  [98 F (36.7 C)-101.8 F (38.8 C)] 98 F (36.7 C) (08/13 1337) Pulse Rate:  [67-91] 80 (08/13 1337) Resp:  [12-23] 18 (08/13 1337) BP: (100-154)/(46-89) 103/53 mmHg (08/13 1337) SpO2:  [92 %-100 %] 98 % (08/13 1337)  General: Little change in her left jaw swelling  Lab Results Lab Results  Component Value Date   WBC 13.0* 06/11/2014   HGB 10.3* 06/11/2014   HCT 30.8* 06/11/2014   MCV 96.6 06/11/2014   PLT 580* 06/11/2014    Lab Results  Component Value Date   CREATININE 0.54  06/11/2014   BUN 10 06/11/2014   NA 137 06/11/2014   K 4.8 06/11/2014   CL 100 06/11/2014   CO2 24 06/11/2014    Lab Results  Component Value Date   ALT 25 06/11/2014   AST 34 06/11/2014   ALKPHOS 93 06/11/2014   BILITOT 0.2* 06/11/2014      Microbiology: Recent Results (from the past 240 hour(s))  SURGICAL PCR SCREEN     Status: None   Collection Time    06/11/14  6:35 PM      Result Value Ref Range Status   MRSA, PCR NEGATIVE  NEGATIVE Final   Staphylococcus aureus NEGATIVE  NEGATIVE Final   Comment:            The Xpert SA Assay (FDA     approved for NASAL specimens     in patients over 59 years of age),     is one component of     a comprehensive surveillance     program.  Test performance has     been validated by [employer] for patients greater  than or equal to 21 year old.     It is not intended     to diagnose infection nor to     guide or monitor treatment.  ANAEROBIC CULTURE     Status: None   Collection Time    06/12/14  2:52 PM      Result Value Ref Range Status   Specimen Description ABSCESS FACE   Final   Special Requests MANDIBULAR ABSCUL PT ON CLINDAMYCIN   Final   Gram Stain     Final   Value: ABUNDANT WBC PRESENT,BOTH PMN AND MONONUCLEAR     NO SQUAMOUS EPITHELIAL CELLS SEEN     MODERATE GRAM POSITIVE COCCI IN PAIRS     IN CLUSTERS     Performed at [facility]   Culture     Final   Value: NO ANAEROBES ISOLATED; CULTURE IN PROGRESS FOR 5 DAYS     Performed at [facility]   Report Status PENDING   Incomplete  CULTURE, ROUTINE-ABSCESS     Status: None   Collection Time    06/12/14  2:52 PM      Result Value Ref Range Status   Specimen Description ABSCESS FACE   Final   Special Requests MANDIBULAR ABSCESS PT ON CLINDAMYCIN   Final   Gram Stain     Final   Value: ABUNDANT WBC PRESENT,BOTH PMN AND MONONUCLEAR     NO SQUAMOUS EPITHELIAL CELLS SEEN     MODERATE GRAM POSITIVE COCCI IN PAIRS     IN CLUSTERS     Performed at  [facility]   Culture     Final   Value: NO GROWTH 1 DAY     Performed at [facility]   Report Status PENDING   Incomplete    Studies/Results: No results found.  Assessment: Abscess in early osteomyelitis was found at the time of surgery yesterday. Operative Gram stain shows gram-positive cocci in pairs and clusters. I will continue ertapenem for broad coverage of gram-negative rods and anaerobes. I will vancomycin for the possibility of MRSA. I do not feel that she needs the redundant anaerobic coverage afforded by metronidazole.  Plan: 1. Continue ertapenem 2. Start vancomycin 3. Discontinue metronidazole 4. Await final culture results  Michel Bickers, MD [facility] for [place] [phone] pager   [phone] cell 06/13/2014, 3:29 PM

## 2014-06-13 NOTE — Progress Notes (Signed)
ANTIBIOTIC CONSULT NOTE - INITIAL  Pharmacy Consult for vancomycin  Indication: Left mandibular osteomyelitis with abscesses  Allergies  Allergen Reactions  . Sulfa Antibiotics Nausea And Vomiting  . Codeine Nausea And Vomiting    Patient Measurements: Height: 5' 2.99" (160 cm) Weight: 125 lb (56.7 kg) IBW/kg (Calculated) : 52.38 Adjusted Body Weight:   Vital Signs: Temp: 98.4 F (36.9 C) (08/13 2132) Temp src: Oral (08/13 2132) BP: 101/49 mmHg (08/13 2132) Pulse Rate: 77 (08/13 2132) Intake/Output from previous day: 08/12 0701 - 08/13 0700 In: 1036 [I.V.:1036] Out: 2075 [Urine:2075] Intake/Output from this shift: Total I/O In: -  Out: 300 [Urine:300]  Labs:  Recent Labs  06/11/14 1435  WBC 13.0*  HGB 10.3*  PLT 580*  CREATININE 0.54   Estimated Creatinine Clearance: 61.1 ml/min (by C-G formula based on Cr of 0.54). No results found for this basename: VANCOTROUGH, Corlis Leak, VANCORANDOM, GENTTROUGH, GENTPEAK, GENTRANDOM, TOBRATROUGH, TOBRAPEAK, TOBRARND, AMIKACINPEAK, AMIKACINTROU, AMIKACIN,  in the last 72 hours   Microbiology: Recent Results (from the past 720 hour(s))  SURGICAL PCR SCREEN     Status: None   Collection Time    06/01/14  4:30 AM      Result Value Ref Range Status   MRSA, PCR NEGATIVE  NEGATIVE Final   Staphylococcus aureus NEGATIVE  NEGATIVE Final   Comment:            The Xpert SA Assay (FDA     approved for NASAL specimens     in patients over 81 years of age),     is one component of     a comprehensive surveillance     program.  Test performance has     been validated by [employer] for patients greater     than or equal to 91 year old.     It is not intended     to diagnose infection nor to     guide or monitor treatment.  CULTURE, ROUTINE-ABSCESS     Status: None   Collection Time    06/01/14  8:06 AM      Result Value Ref Range Status   Specimen Description ABSCESS   Final   Special Requests LEFT MASTICATOR SPACE  ABSCESS   Final   Gram Stain     Final   Value: RARE WBC PRESENT, PREDOMINANTLY PMN     NO SQUAMOUS EPITHELIAL CELLS SEEN     NO ORGANISMS SEEN     Performed at [facility]   Culture     Final   Value: MULTIPLE ORGANISMS PRESENT, NONE PREDOMINANT     Note: NO STAPHYLOCOCCUS AUREUS ISOLATED NO GROUP A STREP (S.PYOGENES) ISOLATED     Performed at [facility]   Report Status 06/06/2014 FINAL   Final  AFB CULTURE WITH SMEAR     Status: None   Collection Time    06/01/14  8:06 AM      Result Value Ref Range Status   Specimen Description ABSCESS   Final   Special Requests LEFT MASTICATOR SPACE ABSCESS   Final   Acid Fast Smear     Final   Value: NO ACID FAST BACILLI SEEN     Performed at [facility]   Culture     Final   Value: CULTURE WILL BE EXAMINED FOR 6 WEEKS BEFORE ISSUING A FINAL REPORT     Performed at [facility]   Report Status PENDING   Incomplete  ANAEROBIC CULTURE     Status: None   Collection Time    06/01/14  8:06 AM      Result Value Ref Range Status   Specimen Description ABSCESS   Final   Special Requests LEFT MASTICATOR SPACE ABSCESS   Final   Gram Stain     Final   Value: RARE WBC PRESENT, PREDOMINANTLY PMN     NO SQUAMOUS EPITHELIAL CELLS SEEN     NO ORGANISMS SEEN     Performed at [facility]   Culture     Final   Value: NO ANAEROBES ISOLATED     Performed at [facility]   Report Status 06/06/2014 FINAL   Final  SURGICAL PCR SCREEN     Status: None   Collection Time    06/11/14  6:35 PM      Result Value Ref Range Status   MRSA, PCR NEGATIVE  NEGATIVE Final   Staphylococcus aureus NEGATIVE  NEGATIVE Final   Comment:            The Xpert SA Assay (FDA     approved for NASAL specimens     in patients over 81 years of age),     is one component of     a comprehensive surveillance     program.  Test performance has     been validated by [employer] for patients greater     than or equal to 24  year old.     It is not intended     to diagnose infection nor to     guide or monitor treatment.  ANAEROBIC CULTURE     Status: None   Collection Time    06/12/14  2:52 PM      Result Value Ref Range Status   Specimen Description ABSCESS FACE   Final   Special Requests MANDIBULAR ABSCUL PT ON CLINDAMYCIN   Final   Gram Stain     Final   Value: ABUNDANT WBC PRESENT,BOTH PMN AND MONONUCLEAR     NO SQUAMOUS EPITHELIAL CELLS SEEN     MODERATE GRAM POSITIVE COCCI IN PAIRS     IN CLUSTERS     Performed at [facility]   Culture     Final   Value: NO ANAEROBES ISOLATED; CULTURE IN PROGRESS FOR 5 DAYS     Performed at [facility]   Report Status PENDING   Incomplete  CULTURE, ROUTINE-ABSCESS     Status: None   Collection Time    06/12/14  2:52 PM      Result Value Ref Range Status   Specimen Description ABSCESS FACE   Final   Special Requests MANDIBULAR ABSCESS PT ON CLINDAMYCIN   Final   Gram Stain     Final   Value: ABUNDANT WBC PRESENT,BOTH PMN AND MONONUCLEAR     NO SQUAMOUS EPITHELIAL CELLS SEEN     MODERATE GRAM POSITIVE COCCI IN PAIRS     IN CLUSTERS     Performed at [facility]   Culture     Final   Value: NO GROWTH 1 DAY     Performed at [facility]   Report Status PENDING   Incomplete    Medical History: Past Medical History  Diagnosis Date  . GERD (gastroesophageal reflux disease)   . Wears glasses   . Oral mass     left  . PONV (postoperative nausea and vomiting) 1988    "  after tubes tied"  . Rheumatoid arteritis   . Oral cancer 01/2014    Medications:  Scheduled:  . cycloSPORINE  1 drop Both Eyes BID  . ertapenem  1 g Intravenous Q24H  . folic acid  1 mg Oral Daily  . mesalamine  1,000 mg Rectal QHS  . multivitamin with minerals  1 tablet Oral Daily  . pantoprazole  40 mg Oral Daily  . vancomycin  750 mg Intravenous Q8H   Assessment: 63 yr old female who underwent resection of oral cancer on May 27, developed a left  masseter muscle abscess. She was started on oral clindamycin with little improvement. She now has multiple abscesses and osteomyelitis. MD has ordered Vanc per Rx.   Goal of Therapy:  Vancomycin trough level 15-20 mcg/ml  Plan: Vancomycin 750 mg IV q12 hrs. Vanc trough levels when appropriate.   Minta Balsam 06/13/2014,10:42 PM

## 2014-06-14 DIAGNOSIS — A491 Streptococcal infection, unspecified site: Secondary | ICD-10-CM

## 2014-06-14 DIAGNOSIS — Y849 Medical procedure, unspecified as the cause of abnormal reaction of the patient, or of later complication, without mention of misadventure at the time of the procedure: Secondary | ICD-10-CM

## 2014-06-14 DIAGNOSIS — T8140XA Infection following a procedure, unspecified, initial encounter: Secondary | ICD-10-CM

## 2014-06-14 NOTE — Progress Notes (Signed)
Patient ID: Sandra Mann, female   DOB: [birth year], 63 y.o.   MRN: [id number]         [facility] for Infectious Disease    Date of Admission:  06/11/2014           Day 2 ertapenem        Day 1 vancomycin  Active Problems:   Osteomyelitis of mandible   . cycloSPORINE  1 drop Both Eyes BID  . ertapenem  1 g Intravenous Q24H  . folic acid  1 mg Oral Daily  . mesalamine  1,000 mg Rectal QHS  . multivitamin with minerals  1 tablet Oral Daily  . pantoprazole  40 mg Oral Daily  . vancomycin  750 mg Intravenous Q12H    Subjective: She is feeling much better with less pain.   Past Medical History  Diagnosis Date  . GERD (gastroesophageal reflux disease)   . Wears glasses   . Oral mass     left  . PONV (postoperative nausea and vomiting) 1988    "after tubes tied"  . Rheumatoid arteritis   . 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 Smoking status: Former Smoker -- 1.00 packs/day for 30 years    Types: Cigarettes    Quit date: 03/20/2000  . Smokeless tobacco: Never Used  . Alcohol Use: Yes     Comment: 05/28/2014 "drink a glass of wine ~ q night; none for the last week"    History reviewed. No pertinent family history.  Allergies  Allergen Reactions  . Sulfa Antibiotics Nausea And Vomiting  . Codeine Nausea And Vomiting    Objective: Temp:  [97.9 F (36.6 C)-98.4 F (36.9 C)] 98 F (36.7 C) (08/14 0530) Pulse Rate:  [70-80] 73 (08/14 0530) Resp:  [16-18] 16 (08/14 0530) BP: (101-120)/(49-60) 111/50 mmHg (08/14 0530) SpO2:  [96 %-99 %] 99 % (08/14 0530) Weight:  [125 lb (56.7 kg)] 125 lb (56.7 kg) (08/13 1500)  General: She is smiling and in better spirits. There is less swelling and erythema over her left jaw.  Lab Results Lab Results  Component Value Date   WBC 13.0* 06/11/2014   HGB 10.3* 06/11/2014   HCT 30.8* 06/11/2014   MCV 96.6 06/11/2014   PLT 580* 06/11/2014    Lab Results  Component Value Date   CREATININE 0.54 06/11/2014   BUN 10 06/11/2014   NA 137 06/11/2014   K 4.8 06/11/2014   CL 100 06/11/2014   CO2 24 06/11/2014    Lab Results  Component Value Date   ALT 25 06/11/2014   AST 34 06/11/2014   ALKPHOS 93 06/11/2014   BILITOT 0.2* 06/11/2014      Microbiology: Recent Results (from the past 240 hour(s))  SURGICAL PCR SCREEN     Status: None   Collection Time    06/11/14  6:35 PM      Result Value Ref Range Status   MRSA, PCR NEGATIVE  NEGATIVE Final   Staphylococcus aureus NEGATIVE  NEGATIVE Final   Comment:            The Xpert SA Assay (FDA     approved for NASAL specimens     in patients over 8 years of age),     is one component of     a comprehensive surveillance     program.  Test performance has     been validated by [employer] for patients greater  than or equal to 42 year old.     It is not intended     to diagnose infection nor to     guide or monitor treatment.  ANAEROBIC CULTURE     Status: None   Collection Time    06/12/14  2:52 PM      Result Value Ref Range Status   Specimen Description ABSCESS FACE   Final   Special Requests MANDIBULAR ABSCUL PT ON CLINDAMYCIN   Final   Gram Stain     Final   Value: ABUNDANT WBC PRESENT,BOTH PMN AND MONONUCLEAR     NO SQUAMOUS EPITHELIAL CELLS SEEN     MODERATE GRAM POSITIVE COCCI IN PAIRS     IN CLUSTERS     Performed at [facility]   Culture     Final   Value: NO ANAEROBES ISOLATED; CULTURE IN PROGRESS FOR 5 DAYS     Performed at [facility]   Report Status PENDING   Incomplete  CULTURE, ROUTINE-ABSCESS     Status: None   Collection Time    06/12/14  2:52 PM      Result Value Ref Range Status   Specimen Description ABSCESS FACE   Final   Special Requests MANDIBULAR ABSCESS PT ON CLINDAMYCIN   Final   Gram Stain     Final   Value: ABUNDANT WBC PRESENT,BOTH PMN AND MONONUCLEAR     NO SQUAMOUS EPITHELIAL CELLS SEEN     MODERATE GRAM POSITIVE COCCI IN PAIRS     IN CLUSTERS     Performed at [facility]   Culture     Final   Value: MODERATE STREPTOCOCCUS SPECIES     Performed at [facility]   Report Status PENDING   Incomplete    Studies/Results: No results found.  Assessment: Her operative culture has grown a strep species. I would still assume that she could have a mixed aerobic and anaerobic infection given the location of her infection and the fact that her first operative cultures grew multiple organisms. I will continue ertapenem but stop vancomycin now.  Plan: 1. Continue ertapenem 2. Discontinue vancomycin 3. Await final culture results 4. Please call Dr. Talbot Grumbling [phone] for any infectious disease questions this weekend  Michel Bickers, MD [facility] for [place] [phone] pager   [phone] cell 06/14/2014, 1:15 PM

## 2014-06-14 NOTE — Progress Notes (Signed)
Subjective: Pain is diminishing and she has been eating some and walking some.  Objective: Vital signs in last 24 hours: Temp:  [97.9 F (36.6 C)-98.4 F (36.9 C)] 98 F (36.7 C) (08/14 0530) Pulse Rate:  [70-80] 73 (08/14 0530) Resp:  [16-18] 16 (08/14 0530) BP: (101-120)/(47-60) 111/50 mmHg (08/14 0530) SpO2:  [96 %-100 %] 99 % (08/14 0530) Weight:  [125 lb (56.7 kg)] 125 lb (56.7 kg) (08/13 1500) Weight change:  Last BM Date: 06/13/14  Intake/Output from previous day: 08/13 0701 - 08/14 0700 In: 2313 [P.O.:1800; I.V.:513] Out: 2500 [Urine:2500] Intake/Output this shift:    PHYSICAL EXAM: Swelling and trismus slightly down. Drains in place. Dark purulent drainage on the dressing. Dressing changed.  Lab Results:  Recent Labs  06/11/14 1435  WBC 13.0*  HGB 10.3*  HCT 30.8*  PLT 580*   BMET  Recent Labs  06/11/14 1435  NA 137  K 4.8  CL 100  CO2 24  GLUCOSE 87  BUN 10  CREATININE 0.54  CALCIUM 9.1    Studies/Results: No results found.  Medications: I have reviewed the patient's current medications.  Assessment/Plan: Slowly improving. ID help greatly appreciated. Continue IV Abx. Await final cultures. Strep so far. Start advancing drains Monday or Tuesday.   LOS: 3 days   Jennfier Abdulla 06/14/2014, 9:04 AM

## 2014-06-15 NOTE — Progress Notes (Signed)
3 Days Post-Op  Subjective: Doing well. Swelling decreased and pain  Objective: Vital signs in last 24 hours: Temp:  [98 F (36.7 C)-98.3 F (36.8 C)] 98.3 F (36.8 C) (08/15 0531) Pulse Rate:  [64-70] 70 (08/15 0531) Resp:  [17-18] 17 (08/15 0531) BP: (109-115)/(47-66) 110/66 mmHg (08/15 0531) SpO2:  [98 %-100 %] 98 % (08/15 0531) Last BM Date: 06/13/14  Intake/Output from previous day: 08/14 0701 - 08/15 0700 In: 1370 [P.O.:770; I.V.:600] Out: 350 [Urine:350] Intake/Output this shift:    awake alert. up in chair. swelling seems moderaste and no skin erythema. drains in place. cv- reg ext no swelling  Lab Results:  No results found for this basename: WBC, HGB, HCT, PLT,  in the last 72 hours BMET No results found for this basename: NA, K, CL, CO2, GLUCOSE, BUN, CREATININE, CALCIUM,  in the last 72 hours PT/INR No results found for this basename: LABPROT, INR,  in the last 72 hours ABG No results found for this basename: PHART, PCO2, PO2, HCO3,  in the last 72 hours  Studies/Results: No results found.  Anti-infectives: Anti-infectives   Start     Dose/Rate Route Frequency Ordered Stop   06/14/14 0600  vancomycin (VANCOCIN) IVPB 750 mg/150 ml premix  Status:  Discontinued     750 mg 150 mL/hr over 60 Minutes Intravenous Every 12 hours 06/13/14 2253 06/14/14 1324   06/13/14 1800  vancomycin (VANCOCIN) IVPB 750 mg/150 ml premix  Status:  Discontinued     750 mg 150 mL/hr over 60 Minutes Intravenous Every 8 hours 06/13/14 1612 06/13/14 2253   06/13/14 0930  ertapenem (INVANZ) 1 g in sodium chloride 0.9 % 50 mL IVPB     1 g 100 mL/hr over 30 Minutes Intravenous Every 24 hours 06/13/14 0827     06/13/14 0600  metroNIDAZOLE (FLAGYL) IVPB 500 mg  Status:  Discontinued     500 mg 100 mL/hr over 60 Minutes Intravenous Every 6 hours 06/13/14 0103 06/13/14 1534   06/12/14 1800  metroNIDAZOLE (FLAGYL) tablet 500 mg  Status:  Discontinued     500 mg Oral Every 6 hours 06/12/14  1636 06/13/14 0105   06/12/14 1800  clindamycin (CLEOCIN) capsule 300 mg  Status:  Discontinued     300 mg Oral 4 times daily 06/12/14 1636 06/13/14 0827   06/12/14 1636  valACYclovir (VALTREX) tablet 500 mg     500 mg Oral 2 times daily PRN 06/12/14 1636     06/11/14 2200  clindamycin (CLEOCIN) IVPB 600 mg  Status:  Discontinued     600 mg 100 mL/hr over 30 Minutes Intravenous Every 6 hours 06/11/14 1750 06/13/14 0827   06/11/14 1200  clindamycin (CLEOCIN) IVPB 600 mg  Status:  Discontinued     600 mg 100 mL/hr over 30 Minutes Intravenous 4 times per day 06/11/14 0950 06/11/14 1750      Assessment/Plan: s/p Procedure(s): LEFT INCISION AND DRAINAGE MANDIBULAR ABSCESS (Left) continue care with abx and drains  LOS: 4 days    Sandra Mann 06/15/2014

## 2014-06-16 LAB — CULTURE, ROUTINE-ABSCESS

## 2014-06-16 NOTE — Progress Notes (Signed)
4 Days Post-Op  Subjective: Feels like the sweling is decreased and pain controlled. She feels like she is getting too much fluids  Objective: Vital signs in last 24 hours: Temp:  [97.7 F (36.5 C)-98.1 F (36.7 C)] 98 F (36.7 C) (08/16 0614) Pulse Rate:  [69-75] 73 (08/16 0614) Resp:  [16-18] 16 (08/16 0614) BP: (116-140)/(59-66) 140/66 mmHg (08/16 0614) SpO2:  [100 %] 100 % (08/16 0614) Last BM Date: 06/13/14  Intake/Output from previous day: 08/15 0701 - 08/16 0700 In: 840 [P.O.:240; I.V.:600] Out: -  Intake/Output this shift:    no erythema. swelling decreased but still firm around mandible. cv reg l- clear  Lab Results:  No results found for this basename: WBC, HGB, HCT, PLT,  in the last 72 hours BMET No results found for this basename: NA, K, CL, CO2, GLUCOSE, BUN, CREATININE, CALCIUM,  in the last 72 hours PT/INR No results found for this basename: LABPROT, INR,  in the last 72 hours ABG No results found for this basename: PHART, PCO2, PO2, HCO3,  in the last 72 hours  Studies/Results: No results found.  Anti-infectives: Anti-infectives   Start     Dose/Rate Route Frequency Ordered Stop   06/14/14 0600  vancomycin (VANCOCIN) IVPB 750 mg/150 ml premix  Status:  Discontinued     750 mg 150 mL/hr over 60 Minutes Intravenous Every 12 hours 06/13/14 2253 06/14/14 1324   06/13/14 1800  vancomycin (VANCOCIN) IVPB 750 mg/150 ml premix  Status:  Discontinued     750 mg 150 mL/hr over 60 Minutes Intravenous Every 8 hours 06/13/14 1612 06/13/14 2253   06/13/14 0930  ertapenem (INVANZ) 1 g in sodium chloride 0.9 % 50 mL IVPB     1 g 100 mL/hr over 30 Minutes Intravenous Every 24 hours 06/13/14 0827     06/13/14 0600  metroNIDAZOLE (FLAGYL) IVPB 500 mg  Status:  Discontinued     500 mg 100 mL/hr over 60 Minutes Intravenous Every 6 hours 06/13/14 0103 06/13/14 1534   06/12/14 1800  metroNIDAZOLE (FLAGYL) tablet 500 mg  Status:  Discontinued     500 mg Oral Every 6  hours 06/12/14 1636 06/13/14 0105   06/12/14 1800  clindamycin (CLEOCIN) capsule 300 mg  Status:  Discontinued     300 mg Oral 4 times daily 06/12/14 1636 06/13/14 0827   06/12/14 1636  valACYclovir (VALTREX) tablet 500 mg     500 mg Oral 2 times daily PRN 06/12/14 1636     06/11/14 2200  clindamycin (CLEOCIN) IVPB 600 mg  Status:  Discontinued     600 mg 100 mL/hr over 30 Minutes Intravenous Every 6 hours 06/11/14 1750 06/13/14 0827   06/11/14 1200  clindamycin (CLEOCIN) IVPB 600 mg  Status:  Discontinued     600 mg 100 mL/hr over 30 Minutes Intravenous 4 times per day 06/11/14 0950 06/11/14 1750      Assessment/Plan: s/p Procedure(s): LEFT INCISION AND DRAINAGE MANDIBULAR ABSCESS (Left) continue IV abx . decreased IV fluids  LOS: 5 days    Sandra Mann 06/16/2014

## 2014-06-17 DIAGNOSIS — B954 Other streptococcus as the cause of diseases classified elsewhere: Secondary | ICD-10-CM

## 2014-06-17 LAB — ANAEROBIC CULTURE

## 2014-06-17 NOTE — Progress Notes (Signed)
Subjective: Feeling much better, off most of the narcotics.  Objective: Vital signs in last 24 hours: Temp:  [97.5 F (36.4 C)-98.3 F (36.8 C)] 97.5 F (36.4 C) (08/17 0650) Pulse Rate:  [71-74] 74 (08/17 0650) Resp:  [17-18] 17 (08/17 0650) BP: (116-125)/(54-64) 124/64 mmHg (08/17 0650) SpO2:  [100 %] 100 % (08/17 0650) Weight change:  Last BM Date: 06/13/14  Intake/Output from previous day: 08/16 0701 - 08/17 0700 In: 490 [P.O.:480; I.V.:10] Out: -  Intake/Output this shift:    PHYSICAL EXAM: Swelling significantly reduced. Still firmness around the angle of the mandible. Drains were advanced about 2 cm and dressing was replaced. Trismus slightly improved.  Lab Results: No results found for this basename: WBC, HGB, HCT, PLT,  in the last 72 hours BMET No results found for this basename: NA, K, CL, CO2, GLUCOSE, BUN, CREATININE, CALCIUM,  in the last 72 hours  Studies/Results: No results found.  Medications: I have reviewed the patient's current medications.  Assessment/Plan: Slow progression, group D. Strep abscess. Continue IV antibiotics. Will adjust as needed pending infectious disease recommendations. We can consider home health care and continued IV therapy at home once we know for sure which antibiotic to continue her on.   LOS: 6 days   Sandra Mann 06/17/2014, 9:40 AM

## 2014-06-17 NOTE — Progress Notes (Signed)
[facility]  Date of Admission:  06/11/2014  Antibiotics:  Subjective: Continues to do well, little pain  Objective: Temp:  [97.5 F (36.4 C)-98.2 F (36.8 C)] 98.2 F (36.8 C) (08/17 1325) Pulse Rate:  [68-74] 68 (08/17 1325) Resp:  [17-18] 18 (08/17 1325) BP: (115-124)/(54-64) 115/59 mmHg (08/17 1325) SpO2:  [100 %] 100 % (08/17 1325)  General: Awake, alert Skin: no rashes Lungs: CTA   Lab Results Lab Results  Component Value Date   WBC 13.0* 06/11/2014   HGB 10.3* 06/11/2014   HCT 30.8* 06/11/2014   MCV 96.6 06/11/2014   PLT 580* 06/11/2014    Lab Results  Component Value Date   CREATININE 0.54 06/11/2014   BUN 10 06/11/2014   NA 137 06/11/2014   K 4.8 06/11/2014   CL 100 06/11/2014   CO2 24 06/11/2014    Lab Results  Component Value Date   ALT 25 06/11/2014   AST 34 06/11/2014   ALKPHOS 93 06/11/2014   BILITOT 0.2* 06/11/2014      Microbiology: Recent Results (from the past 240 hour(s))  SURGICAL PCR SCREEN     Status: None   Collection Time    06/11/14  6:35 PM      Result Value Ref Range Status   MRSA, PCR NEGATIVE  NEGATIVE Final   Staphylococcus aureus NEGATIVE  NEGATIVE Final   Comment:            The Xpert SA Assay (FDA     approved for NASAL specimens     in patients over 26 years of age),     is one component of     a comprehensive surveillance     program.  Test performance has     been validated by [employer] for patients greater     than or equal to 61 year old.     It is not intended     to diagnose infection nor to     guide or monitor treatment.  ANAEROBIC CULTURE     Status: None   Collection Time    06/12/14  2:52 PM      Result Value Ref Range Status   Specimen Description ABSCESS FACE   Final   Special Requests MANDIBULAR ABSCUL PT ON CLINDAMYCIN   Final   Gram Stain     Final   Value: ABUNDANT WBC PRESENT,BOTH PMN AND MONONUCLEAR     NO SQUAMOUS EPITHELIAL CELLS SEEN     MODERATE GRAM POSITIVE  COCCI IN PAIRS     IN CLUSTERS     Performed at [facility]   Culture     Final   Value: NO ANAEROBES ISOLATED     Performed at [facility]   Report Status 06/17/2014 FINAL   Final  CULTURE, ROUTINE-ABSCESS     Status: None   Collection Time    06/12/14  2:52 PM      Result Value Ref Range Status   Specimen Description ABSCESS FACE   Final   Special Requests MANDIBULAR ABSCESS PT ON CLINDAMYCIN   Final   Gram Stain     Final   Value: ABUNDANT WBC PRESENT,BOTH PMN AND MONONUCLEAR     NO SQUAMOUS EPITHELIAL CELLS SEEN     MODERATE GRAM POSITIVE COCCI IN PAIRS     IN CLUSTERS     Performed at [facility]   Culture     Final  Value: MODERATE STREPTOCOCCUS GROUP D;high probability for S.bovis     Performed at [facility]   Report Status 06/16/2014 FINAL   Final   Organism ID, Bacteria STREPTOCOCCUS GROUP D;high probability for S.bovis   Final    Studies/Results: No results found.  Assessment/Plan: 1) mandibular osteomyelitis with abscess - Has grown Strep bovis, likely polymicrobial.  Doing well with ertapenem.  Will continue with plan through 9/24 (6 weeks) Antibiotics per home health protocol Weekly cbc, cmp to [facility] We will arrange follow up in 2-3 weeks  Sandra Mann, Sandra Mann, [facility] [place] [email or web address] [phone] pager   [phone] cell 06/17/2014, 4:20 PM

## 2014-06-18 MED ORDER — HEPARIN SOD (PORK) LOCK FLUSH 100 UNIT/ML IV SOLN
250.0000 [IU] | INTRAVENOUS | Status: AC | PRN
  Administered 2014-06-18: 250 [IU]

## 2014-06-18 MED ORDER — SODIUM CHLORIDE 0.9 % IV SOLN: 1.0000 g | INTRAVENOUS | Status: DC

## 2014-06-18 NOTE — Discharge Summary (Signed)
Physician Discharge Summary  Patient ID: Sandra Mann MRN: [id number] DOB/AGE: [birth year] 61 y.o.  Admit date: 06/11/2014 Discharge date: 06/18/2014  Admission Diagnoses: Osteomyelitis of the mandible  Discharge Diagnoses:  Active Problems:   Osteomyelitis of mandible   Discharged Condition: fair  Hospital Course: Slowly improved after incision and drainage, intravenous antibiotics.  Consults: Infectious disease  Significant Diagnostic Studies: none  Treatments: surgery: Incision and drainage of peri-mandibular abscess  Discharge Exam: Blood pressure 125/63, pulse 66, temperature 98.2 F (36.8 C), temperature source Oral, resp. rate 20, height 5' 2.99" (1.6 m), weight 125 lb (56.7 kg), SpO2 100.00%. PHYSICAL EXAM: Swelling resolving nicely. Trismus improving. Drains in place.  Disposition: 01-Home or Self Care  Discharge Instructions   Diet - low sodium heart healthy    Complete by:  As directed      Increase activity slowly    Complete by:  As directed             Medication List    STOP taking these medications       clindamycin 300 MG capsule  Commonly known as:  CLEOCIN     metroNIDAZOLE 500 MG tablet  Commonly known as:  FLAGYL     oxyCODONE-acetaminophen 5-325 MG per tablet  Commonly known as:  PERCOCET/ROXICET      TAKE these medications       CANASA 1000 MG suppository  Generic drug:  mesalamine  Place 1,000 mg rectally at bedtime.     cycloSPORINE 0.05 % ophthalmic emulsion  Commonly known as:  RESTASIS  Place 1 drop into both eyes 2 (two) times daily.     DEXILANT 60 MG capsule  Generic drug:  dexlansoprazole  Take 60 mg by mouth daily.     ertapenem 1 g in sodium chloride 0.9 % 50 mL  Inject 1 g into the vein daily.     folic acid 1 MG tablet  Commonly known as:  FOLVITE  Take 1 mg by mouth daily.     HYDROcodone-acetaminophen 7.5-325 MG per tablet  Commonly known as:  NORCO  Take 1 tablet by mouth every 6 (six) hours as needed  for moderate pain.     ibuprofen 200 MG tablet  Commonly known as:  ADVIL,MOTRIN  Take 600 mg by mouth every 6 (six) hours as needed for mild pain.     methotrexate 2.5 MG tablet  Commonly known as:  RHEUMATREX  Take 10 mg by mouth 2 (two) times a week. Take 4 tablets (10 mg) on Tuesday morning and 4 tablets (10 mg) on Tuesday night.     multivitamin with minerals Tabs tablet  Take 1 tablet by mouth daily.     ondansetron 4 MG tablet  Commonly known as:  ZOFRAN  Take 4 mg by mouth every 8 (eight) hours as needed for nausea or vomiting.     promethazine 25 MG suppository  Commonly known as:  PHENERGAN  Place 1 suppository (25 mg total) rectally every 6 (six) hours as needed for nausea or vomiting.     traMADol 50 MG tablet  Commonly known as:  ULTRAM  Take 50 mg by mouth every 6 (six) hours as needed for moderate pain.     valACYclovir 500 MG tablet  Commonly known as:  VALTREX  Take 500 mg by mouth 2 (two) times daily as needed (for fever blisters).           Follow-up Information   Follow up with Izora Gala, MD. Schedule  an appointment as soon as possible for a visit on 06/19/2014.   Specialty:  Otolaryngology   Contact information:   [address] [place] [address] [phone]       Signed: Izora Gala 06/18/2014, 8:20 AM

## 2014-06-18 NOTE — Progress Notes (Signed)
Subjective: Feeling better, only taking ibuprofen for pain.  Objective: Vital signs in last 24 hours: Temp:  [98.1 F (36.7 C)-98.2 F (36.8 C)] 98.2 F (36.8 C) (08/18 0543) Pulse Rate:  [66-68] 66 (08/18 0543) Resp:  [18-20] 20 (08/18 0543) BP: (115-125)/(59-63) 125/63 mmHg (08/18 0543) SpO2:  [99 %-100 %] 100 % (08/18 0543) Weight change:  Last BM Date: 06/17/14  Intake/Output from previous day: 08/17 0701 - 08/18 0700 In: 240 [P.O.:240] Out: -  Intake/Output this shift:    PHYSICAL EXAM: Swelling down. Able to feel the mandible edge now. Trismus improving. Drains advanced another 2 cm. Dressing changed.  Lab Results: No results found for this basename: WBC, HGB, HCT, PLT,  in the last 72 hours BMET No results found for this basename: NA, K, CL, CO2, GLUCOSE, BUN, CREATININE, CALCIUM,  in the last 72 hours  Studies/Results: No results found.  Medications: I have reviewed the patient's current medications.  Assessment/Plan: Progressing nicely. We will send her home on IV antibiotics for 6 weeks. She will followup Wednesday and Friday for drain advancement.  LOS: 7 days   Aryanna Shaver 06/18/2014, 8:15 AM

## 2014-06-18 NOTE — Discharge Instructions (Signed)
Change the dressing as needed. Keep the area clean and dry. Continue jaw exercises.

## 2014-07-09 ENCOUNTER — Encounter: Payer: Self-pay | Admitting: Internal Medicine

## 2014-07-09 ENCOUNTER — Ambulatory Visit (INDEPENDENT_AMBULATORY_CARE_PROVIDER_SITE_OTHER): Payer: 59 | Admitting: Internal Medicine

## 2014-07-09 ENCOUNTER — Telehealth: Payer: Self-pay | Admitting: *Deleted

## 2014-07-09 VITALS — BP 115/67 | HR 58 | Temp 97.7°F | Ht 63.5 in | Wt 118.0 lb

## 2014-07-09 DIAGNOSIS — M272 Inflammatory conditions of jaws: Secondary | ICD-10-CM

## 2014-07-09 NOTE — Progress Notes (Signed)
   Subjective:    Patient ID: Sandra Mann, female    DOB: 1950/12/25, 63 y.o.   MRN: [id number]  HPI Here for follow up from hospital.  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  Was placed on oral clindamycin but continued to have problems and readmitted for I and D in July.  Cultures were multiple organisms.  She continued with clinda and CT scan later with multiple small abscesses and bony erosion concerning for osteomyelitis.  She went to the OR 8/12 and debrided and started on empiric therapy changed to ertapenem after ID consult.  Culture grew Group D strep and continued on ertapenem for likely multiple organisms.  Plan for 6 weeks through Sept 9/24.     Review of Systems  Constitutional: Negative for fever and chills.  Gastrointestinal: Negative for nausea and diarrhea.  Skin: Negative for rash.  Neurological: Negative for dizziness.       Objective:   Physical Exam  Constitutional: She appears well-developed and well-nourished. No distress.  Eyes: No scleral icterus.  Cardiovascular: Normal rate, regular rhythm and normal heart sounds.   No murmur heard. Skin:  Right picc with no erythema, no issues          Assessment & Plan:

## 2014-07-09 NOTE — Assessment & Plan Note (Signed)
Will continue with 6 weeks of invanz through 9/24 and will have picc removed.  She should call or rtc for any concerns.   Otherwise PRN

## 2014-07-09 NOTE — Telephone Encounter (Signed)
Verbal order per Dr. Linus Salmons given to [place] at [facility] to pull patient's picc line on 07/25/14 after last dose of IV antibiotic.

## 2014-07-14 LAB — AFB CULTURE WITH SMEAR (NOT AT ARMC): Acid Fast Smear: NONE SEEN

## 2014-09-04 ENCOUNTER — Encounter: Payer: Self-pay | Admitting: Internal Medicine

## 2014-10-15 ENCOUNTER — Other Ambulatory Visit: Payer: Self-pay | Admitting: Oral Surgery

## 2014-11-21 ENCOUNTER — Inpatient Hospital Stay (HOSPITAL_COMMUNITY)
Admission: AD | Admit: 2014-11-21 | Discharge: 2014-11-26 | DRG: 158 | Disposition: A | Discharge status: Home / Self Care | Payer: 59 | Source: Ambulatory Visit | Attending: Otolaryngology | Admitting: Otolaryngology

## 2014-11-21 DIAGNOSIS — L089 Local infection of the skin and subcutaneous tissue, unspecified: Secondary | ICD-10-CM | POA: Diagnosis present

## 2014-11-21 DIAGNOSIS — M272 Inflammatory conditions of jaws: Principal | ICD-10-CM | POA: Diagnosis present

## 2014-11-21 DIAGNOSIS — K219 Gastro-esophageal reflux disease without esophagitis: Secondary | ICD-10-CM | POA: Diagnosis present

## 2014-11-21 DIAGNOSIS — M069 Rheumatoid arthritis, unspecified: Secondary | ICD-10-CM | POA: Diagnosis present

## 2014-11-21 DIAGNOSIS — Z791 Long term (current) use of non-steroidal anti-inflammatories (NSAID): Secondary | ICD-10-CM

## 2014-11-21 DIAGNOSIS — Z885 Allergy status to narcotic agent status: Secondary | ICD-10-CM | POA: Diagnosis not present

## 2014-11-21 DIAGNOSIS — Z79899 Other long term (current) drug therapy: Secondary | ICD-10-CM

## 2014-11-21 DIAGNOSIS — Z85818 Personal history of malignant neoplasm of other sites of lip, oral cavity, and pharynx: Secondary | ICD-10-CM | POA: Diagnosis not present

## 2014-11-21 DIAGNOSIS — K5120 Ulcerative (chronic) proctitis without complications: Secondary | ICD-10-CM | POA: Diagnosis present

## 2014-11-21 DIAGNOSIS — Z87891 Personal history of nicotine dependence: Secondary | ICD-10-CM

## 2014-11-21 DIAGNOSIS — B954 Other streptococcus as the cause of diseases classified elsewhere: Secondary | ICD-10-CM | POA: Diagnosis present

## 2014-11-21 DIAGNOSIS — Z85819 Personal history of malignant neoplasm of unspecified site of lip, oral cavity, and pharynx: Secondary | ICD-10-CM | POA: Diagnosis not present

## 2014-11-21 DIAGNOSIS — R220 Localized swelling, mass and lump, head: Secondary | ICD-10-CM | POA: Diagnosis present

## 2014-11-21 DIAGNOSIS — Z882 Allergy status to sulfonamides status: Secondary | ICD-10-CM | POA: Diagnosis not present

## 2014-11-21 DIAGNOSIS — K5900 Constipation, unspecified: Secondary | ICD-10-CM | POA: Diagnosis present

## 2014-11-21 LAB — CBC
HEMATOCRIT: 39 % (ref 36.0–46.0)
Hemoglobin: 13.5 g/dL (ref 12.0–15.0)
MCH: 33.6 pg (ref 26.0–34.0)
MCHC: 34.6 g/dL (ref 30.0–36.0)
MCV: 97 fL (ref 78.0–100.0)
Platelets: 379 10*3/uL (ref 150–400)
RBC: 4.02 MIL/uL (ref 3.87–5.11)
RDW: 13.9 % (ref 11.5–15.5)
WBC: 14.8 10*3/uL — AB (ref 4.0–10.5)

## 2014-11-21 LAB — CREATININE, SERUM
Creatinine, Ser: 0.73 mg/dL (ref 0.50–1.10)
GFR calc Af Amer: >90 mL/min (ref >90)
GFR calc non Af Amer: >90 mL/min (ref >90)

## 2014-11-21 MED ORDER — SODIUM CHLORIDE 0.9 % IJ SOLN
3.0000 mL | Freq: Two times a day (BID) | INTRAMUSCULAR | Status: DC
  Administered 2014-11-22 – 2014-11-26 (×4): 3 mL via INTRAVENOUS

## 2014-11-21 MED ORDER — CLINDAMYCIN PHOSPHATE 600 MG/50ML IV SOLN
600.0000 mg | Freq: Four times a day (QID) | INTRAVENOUS | Status: DC
  Administered 2014-11-21 – 2014-11-25 (×18): 600 mg via INTRAVENOUS
  Filled 2014-11-21 (×20): qty 50

## 2014-11-21 MED ORDER — PROMETHAZINE HCL 25 MG PO TABS
12.5000 mg | ORAL_TABLET | Freq: Four times a day (QID) | ORAL | Status: DC | PRN
  Administered 2014-11-21 – 2014-11-26 (×4): 12.5 mg via ORAL
  Filled 2014-11-21 (×3): qty 1

## 2014-11-21 MED ORDER — SODIUM CHLORIDE 0.9 % IJ SOLN: 3.0000 mL | INTRAMUSCULAR | Status: DC | PRN

## 2014-11-21 MED ORDER — MORPHINE SULFATE 2 MG/ML IJ SOLN
2.0000 mg | INTRAMUSCULAR | Status: DC | PRN
  Administered 2014-11-24: 2 mg via INTRAVENOUS
  Administered 2014-11-24 – 2014-11-25 (×2): 4 mg via INTRAVENOUS
  Administered 2014-11-25 – 2014-11-26 (×2): 2 mg via INTRAVENOUS
  Filled 2014-11-21 (×2): qty 1
  Filled 2014-11-21 (×2): qty 2
  Filled 2014-11-21: qty 1

## 2014-11-21 MED ORDER — HEPARIN SODIUM (PORCINE) 5000 UNIT/ML IJ SOLN
5000.0000 [IU] | Freq: Three times a day (TID) | INTRAMUSCULAR | Status: DC
  Administered 2014-11-21 – 2014-11-23 (×5): 5000 [IU] via SUBCUTANEOUS
  Filled 2014-11-21 (×17): qty 1

## 2014-11-21 MED ORDER — HYDROCODONE-ACETAMINOPHEN 5-325 MG PO TABS
1.0000 | ORAL_TABLET | ORAL | Status: DC | PRN
  Administered 2014-11-21 (×2): 2 via ORAL
  Filled 2014-11-21 (×2): qty 2

## 2014-11-21 MED ORDER — SODIUM CHLORIDE 0.9 % IV SOLN: 250.0000 mL | INTRAVENOUS | Status: DC | PRN

## 2014-11-21 NOTE — H&P (Signed)
  Assessment  Rheumatoid arthritis (714.0) (M06.9). Swelling of mandible (784.2) (R22.0). 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 Discussed  She had been doing great and then 2 nights ago started to have pain and swelling along the left side of the mandible again. She had a biopsy of an oral mucosal lesion in that area about 3 weeks ago which was benign. She continues to take methotrexate for rheumatoid arthritis.   On exam, there is 4-5 cm firm slightly tender swelling along the inferior aspect of the left mandible. There is several white pustules along the lower buccal mucosa in that area but nothing else palpable intraorally.   Recurrent infection/osteomyelitis of the mandible. Recommend admission to the hospital, initiation of intravenous antibiotics, CT scan imaging and Panorex, then we'll hold off on methotrexate for now. Reason For Visit  Left jaw swelling and painful. Allergies  Codeine Derivatives Sulfa Drugs. Current Meds  Folic Acid 1 MG Oral Tablet;TAKE 1 TABLET DAILY.; RPT Methotrexate 2.5 MG Oral Tablet;TAKE 8 TABLET WEEKLY; RPT Restasis 0.05 % Ophthalmic Emulsion;; RPT Multivitamins TABS;; RPT Canasa 1000 MG Rectal Suppository;; RPT Magic Mouthwash 18.5-0.45-0.005% liquid;one tsp every 4 to 6 hours swish and swallow; Rx Dexilant 60 MG Oral Capsule Delayed Release;TAKE 1 CAPSULE DAILY EVERY MORNING BEFORE BREAKFAST.; RPT. 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 Pylori) Infection   (041.86) Lower resp. 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 [place]  Cesarean Section Oral Surgery Tubal Ligation. Family Hx  Family history of cerebrovascular accident: Father (V17.1) (Z82.3) Family history of diabetes mellitus: Maternal Grandmother (V18.0) (Z83.3) No pertinent family history: Mother. Personal Hx  Being A Social Drinker Caffeine Use Former smoker [phone]) [phone]). Signature  Electronically signed by : Izora Gala  M.D.; 11/21/2014 1:38 PM EST.

## 2014-11-22 ENCOUNTER — Inpatient Hospital Stay (HOSPITAL_COMMUNITY): Payer: 59

## 2014-11-22 ENCOUNTER — Ambulatory Visit: Payer: Self-pay | Admitting: Otolaryngology

## 2014-11-22 ENCOUNTER — Encounter (HOSPITAL_COMMUNITY)
Admission: AD | Disposition: A | Discharge status: Home / Self Care | Payer: Self-pay | Source: Ambulatory Visit | Attending: Otolaryngology

## 2014-11-22 ENCOUNTER — Inpatient Hospital Stay (HOSPITAL_COMMUNITY): Payer: 59 | Admitting: Anesthesiology

## 2014-11-22 ENCOUNTER — Encounter (HOSPITAL_COMMUNITY): Payer: Self-pay | Admitting: *Deleted

## 2014-11-22 HISTORY — PX: INCISION AND DRAINAGE ABSCESS: SHX5864

## 2014-11-22 LAB — MRSA PCR SCREENING: MRSA BY PCR: NEGATIVE

## 2014-11-22 SURGERY — INCISION AND DRAINAGE, ABSCESS
Anesthesia: General | Site: Face | Laterality: Left

## 2014-11-22 MED ORDER — MIDAZOLAM HCL 5 MG/5ML IJ SOLN
INTRAMUSCULAR | Status: DC | PRN
  Administered 2014-11-22: 2 mg via INTRAVENOUS

## 2014-11-22 MED ORDER — MESALAMINE 1000 MG RE SUPP
1000.0000 mg | Freq: Every day | RECTAL | Status: DC
  Administered 2014-11-22 – 2014-11-25 (×4): 1000 mg via RECTAL
  Filled 2014-11-22 (×5): qty 1

## 2014-11-22 MED ORDER — ONDANSETRON HCL 4 MG/2ML IJ SOLN
INTRAMUSCULAR | Status: DC | PRN
  Administered 2014-11-22: 4 mg via INTRAVENOUS

## 2014-11-22 MED ORDER — IOHEXOL 300 MG/ML  SOLN
75.0000 mL | Freq: Once | INTRAMUSCULAR | Status: AC | PRN
  Administered 2014-11-22: 75 mL via INTRAVENOUS

## 2014-11-22 MED ORDER — ONDANSETRON HCL 4 MG/2ML IJ SOLN
INTRAMUSCULAR | Status: AC
  Filled 2014-11-22: qty 2

## 2014-11-22 MED ORDER — PROPOFOL 10 MG/ML IV BOLUS
INTRAVENOUS | Status: AC
  Filled 2014-11-22: qty 20

## 2014-11-22 MED ORDER — ADULT MULTIVITAMIN W/MINERALS CH
1.0000 | ORAL_TABLET | Freq: Every day | ORAL | Status: DC
  Administered 2014-11-22 – 2014-11-26 (×5): 1 via ORAL
  Filled 2014-11-22 (×7): qty 1

## 2014-11-22 MED ORDER — BACITRACIN ZINC 500 UNIT/GM EX OINT
TOPICAL_OINTMENT | CUTANEOUS | Status: AC
  Filled 2014-11-22: qty 28.35

## 2014-11-22 MED ORDER — LACTATED RINGERS IV SOLN
INTRAVENOUS | Status: DC | PRN
Start: 2014-11-22 — End: 2014-11-22
  Administered 2014-11-22: 15:00:00 via INTRAVENOUS

## 2014-11-22 MED ORDER — VALACYCLOVIR HCL 500 MG PO TABS
500.0000 mg | ORAL_TABLET | Freq: Two times a day (BID) | ORAL | Status: DC | PRN
  Filled 2014-11-22: qty 1

## 2014-11-22 MED ORDER — ROCURONIUM BROMIDE 50 MG/5ML IV SOLN
INTRAVENOUS | Status: AC
  Filled 2014-11-22: qty 1

## 2014-11-22 MED ORDER — DEXTROSE-NACL 5-0.9 % IV SOLN
INTRAVENOUS | Status: DC
  Administered 2014-11-22 – 2014-11-24 (×3): via INTRAVENOUS

## 2014-11-22 MED ORDER — PHENYLEPHRINE HCL 10 MG/ML IJ SOLN
INTRAMUSCULAR | Status: DC | PRN
  Administered 2014-11-22 (×2): 80 ug via INTRAVENOUS

## 2014-11-22 MED ORDER — LIDOCAINE-EPINEPHRINE 1 %-1:100000 IJ SOLN
INTRAMUSCULAR | Status: AC
  Filled 2014-11-22: qty 1

## 2014-11-22 MED ORDER — FENTANYL CITRATE 0.05 MG/ML IJ SOLN
INTRAMUSCULAR | Status: DC | PRN
  Administered 2014-11-22 (×3): 50 ug via INTRAVENOUS

## 2014-11-22 MED ORDER — CYCLOSPORINE 0.05 % OP EMUL
1.0000 [drp] | Freq: Two times a day (BID) | OPHTHALMIC | Status: DC
  Administered 2014-11-22 – 2014-11-26 (×8): 1 [drp] via OPHTHALMIC
  Filled 2014-11-22 (×9): qty 1

## 2014-11-22 MED ORDER — 0.9 % SODIUM CHLORIDE (POUR BTL) OPTIME
TOPICAL | Status: DC | PRN
  Administered 2014-11-22: 1000 mL

## 2014-11-22 MED ORDER — LIDOCAINE HCL (CARDIAC) 20 MG/ML IV SOLN
INTRAVENOUS | Status: DC | PRN
  Administered 2014-11-22: 40 mg via INTRAVENOUS

## 2014-11-22 MED ORDER — IBUPROFEN 600 MG PO TABS
600.0000 mg | ORAL_TABLET | Freq: Four times a day (QID) | ORAL | Status: DC | PRN
Start: 2014-11-22 — End: 2014-11-26
  Administered 2014-11-23 – 2014-11-26 (×8): 600 mg via ORAL
  Filled 2014-11-22 (×9): qty 1

## 2014-11-22 MED ORDER — LACTATED RINGERS IV SOLN
INTRAVENOUS | Status: DC
  Administered 2014-11-22: 17:00:00 via INTRAVENOUS

## 2014-11-22 MED ORDER — LIDOCAINE HCL (CARDIAC) 20 MG/ML IV SOLN
INTRAVENOUS | Status: AC
  Filled 2014-11-22: qty 5

## 2014-11-22 MED ORDER — FOLIC ACID 1 MG PO TABS
1.0000 mg | ORAL_TABLET | Freq: Every day | ORAL | Status: DC
  Administered 2014-11-22 – 2014-11-26 (×5): 1 mg via ORAL
  Filled 2014-11-22 (×7): qty 1

## 2014-11-22 MED ORDER — MIDAZOLAM HCL 2 MG/2ML IJ SOLN
INTRAMUSCULAR | Status: AC
  Filled 2014-11-22: qty 2

## 2014-11-22 MED ORDER — MUPIROCIN 2 % EX OINT
TOPICAL_OINTMENT | CUTANEOUS | Status: AC
  Administered 2014-11-22: 14:00:00
  Filled 2014-11-22: qty 22

## 2014-11-22 MED ORDER — PANTOPRAZOLE SODIUM 40 MG PO TBEC
40.0000 mg | DELAYED_RELEASE_TABLET | Freq: Every day | ORAL | Status: DC
  Administered 2014-11-22 – 2014-11-26 (×5): 40 mg via ORAL
  Filled 2014-11-22 (×4): qty 1

## 2014-11-22 MED ORDER — PROPOFOL 10 MG/ML IV BOLUS
INTRAVENOUS | Status: DC | PRN
  Administered 2014-11-22: 130 mg via INTRAVENOUS

## 2014-11-22 MED ORDER — FENTANYL CITRATE 0.05 MG/ML IJ SOLN
INTRAMUSCULAR | Status: AC
  Filled 2014-11-22: qty 5

## 2014-11-22 SURGICAL SUPPLY — 43 items
BNDG CONFORM 2 STRL LF (GAUZE/BANDAGES/DRESSINGS) IMPLANT
BNDG GAUZE ELAST 4 BULKY (GAUZE/BANDAGES/DRESSINGS) IMPLANT
CANISTER SUCTION 2500CC (MISCELLANEOUS) ×2 IMPLANT
CATH ROBINSON RED A/P 16FR (CATHETERS) ×2 IMPLANT
CLEANER TIP ELECTROSURG 2X2 (MISCELLANEOUS) ×2 IMPLANT
CONT SPEC 4OZ CLIKSEAL STRL BL (MISCELLANEOUS) IMPLANT
CORDS BIPOLAR (ELECTRODE) ×2 IMPLANT
COVER SURGICAL LIGHT HANDLE (MISCELLANEOUS) ×2 IMPLANT
DRAIN PENROSE 1/4X12 LTX STRL (WOUND CARE) IMPLANT
DRSG EMULSION OIL 3X3 NADH (GAUZE/BANDAGES/DRESSINGS) IMPLANT
ELECT COATED BLADE 2.86 ST (ELECTRODE) ×2 IMPLANT
ELECT NEEDLE TIP 2.8 STRL (NEEDLE) IMPLANT
ELECT REM PT RETURN 9FT ADLT (ELECTROSURGICAL) ×2
ELECTRODE REM PT RTRN 9FT ADLT (ELECTROSURGICAL) ×1 IMPLANT
FORCEPS BIPOLAR SPETZLER 8 1.0 (NEUROSURGERY SUPPLIES) ×2 IMPLANT
GAUZE PACKING IODOFORM 1/4X5 (PACKING) IMPLANT
GAUZE SPONGE 4X4 12PLY STRL (GAUZE/BANDAGES/DRESSINGS) IMPLANT
GAUZE SPONGE 4X4 16PLY XRAY LF (GAUZE/BANDAGES/DRESSINGS) ×2 IMPLANT
GLOVE ECLIPSE 7.5 STRL STRAW (GLOVE) ×2 IMPLANT
GOWN STRL REUS W/ TWL LRG LVL3 (GOWN DISPOSABLE) ×2 IMPLANT
GOWN STRL REUS W/TWL LRG LVL3 (GOWN DISPOSABLE) ×2
KIT BASIN OR (CUSTOM PROCEDURE TRAY) ×2 IMPLANT
KIT ROOM TURNOVER OR (KITS) ×2 IMPLANT
NEEDLE HYPO 30X.5 LL (NEEDLE) ×2 IMPLANT
NS IRRIG 1000ML POUR BTL (IV SOLUTION) ×2 IMPLANT
PAD ARMBOARD 7.5X6 YLW CONV (MISCELLANEOUS) ×4 IMPLANT
PENCIL FOOT CONTROL (ELECTRODE) ×2 IMPLANT
POUCH STERILIZING 3 X22 (STERILIZATION PRODUCTS) IMPLANT
SPONGE GAUZE 4X4 12PLY STER LF (GAUZE/BANDAGES/DRESSINGS) ×2 IMPLANT
SUT CHROMIC 4 0 P 3 18 (SUTURE) ×2 IMPLANT
SUT ETHILON 4 0 PS 2 18 (SUTURE) ×2 IMPLANT
SUT ETHILON 5 0 P 3 18 (SUTURE) ×1
SUT NYLON ETHILON 5-0 P-3 1X18 (SUTURE) ×1 IMPLANT
SUT SILK 4 0 REEL (SUTURE) ×2 IMPLANT
SWAB COLLECTION DEVICE MRSA (MISCELLANEOUS) IMPLANT
SYR BULB IRRIGATION 50ML (SYRINGE) IMPLANT
SYR CONTROL 10ML LL (SYRINGE) ×2 IMPLANT
TOWEL OR 17X24 6PK STRL BLUE (TOWEL DISPOSABLE) ×2 IMPLANT
TOWEL OR 17X26 10 PK STRL BLUE (TOWEL DISPOSABLE) ×2 IMPLANT
TRAY ENT MC OR (CUSTOM PROCEDURE TRAY) ×2 IMPLANT
TUBE ANAEROBIC SPECIMEN COL (MISCELLANEOUS) IMPLANT
TUBE GAUZE SZ 8 (GAUZE/BANDAGES/DRESSINGS) ×2 IMPLANT
YANKAUER SUCT BULB TIP NO VENT (SUCTIONS) ×2 IMPLANT

## 2014-11-22 NOTE — Progress Notes (Signed)
Patient ID: Sandra Mann, female   DOB: June 07, 1951, 64 y.o.   MRN: [id number] CT reviewed. Small abscess, large phlegmon. Given her past history, recommend we go ahead and perform incision and drainage today.

## 2014-11-22 NOTE — Anesthesia Postprocedure Evaluation (Signed)
  Anesthesia Post-op Note  Patient: Sandra Mann  Procedure(s) Performed: Procedure(s): INCISION AND DRAINAGE LEFT  MANDIBULAR ABSCESS (Left)  Patient Location: PACU  Anesthesia Type:General  Level of Consciousness: awake, alert  and oriented  Airway and Oxygen Therapy: Patient Spontanous Breathing and Patient connected to nasal cannula oxygen  Post-op Pain: mild  Post-op Assessment: Post-op Vital signs reviewed, Patient's Cardiovascular Status Stable, Respiratory Function Stable, Patent Airway and Pain level controlled  Post-op Vital Signs: stable  Last Vitals:  Filed Vitals:   11/22/14 1713  BP: 105/57  Pulse: 63  Temp: 36.6 C  Resp: 18    Complications: No apparent anesthesia complications

## 2014-11-22 NOTE — Progress Notes (Signed)
11/22/2014 5:36 PM  Sandra Mann [id number]  Post-Op Check   Temp:  [97.7 F (36.5 C)-99.1 F (37.3 C)] 97.9 F (36.6 C) (01/22 1713) Pulse Rate:  [63-82] 63 (01/22 1713) Resp:  [16-19] 18 (01/22 1713) BP: (105-168)/(56-70) 105/57 mmHg (01/22 1713) SpO2:  [96 %-100 %] 100 % (01/22 1713) Weight:  [60.6 kg (133 lb 9.6 oz)] 60.6 kg (133 lb 9.6 oz) (01/22 0541),     Intake/Output Summary (Last 24 hours) at 11/22/14 1736 Last data filed at 11/22/14 1621  Gross per 24 hour  Intake    770 ml  Output      3 ml  Net    767 ml    Results for orders placed or performed during the hospital encounter of 11/21/14 (from the past 24 hour(s))  MRSA PCR Screening     Status: None   Collection Time: 11/22/14  3:00 PM  Result Value Ref Range   MRSA by PCR NEGATIVE NEGATIVE    SUBJECTIVE:  Mild pain.  No SOB.  No chest pain.  Requesting something to drink.  + spont void  OBJECTIVE:  Min facial swelling.  Neck wrap sliding down.  Penrose in good position  IMPRESSION:  Satisfactory check  PLAN:  Await cultures.  IV abx.  Reinforce neck dressing.  Jodi Marble

## 2014-11-22 NOTE — Transfer of Care (Signed)
Immediate Anesthesia Transfer of Care Note  Patient: Sandra Mann  Procedure(s) Performed: Procedure(s): INCISION AND DRAINAGE LEFT  MANDIBULAR ABSCESS (Left)  Patient Location: PACU  Anesthesia Type:General  Level of Consciousness: awake  Airway & Oxygen Therapy: Patient Spontanous Breathing and Patient connected to nasal cannula oxygen  Post-op Assessment: Report given to PACU RN, Post -op Vital signs reviewed and stable and Patient moving all extremities  Post vital signs: Reviewed and stable  Complications: No apparent anesthesia complications

## 2014-11-22 NOTE — Op Note (Signed)
OPERATIVE REPORT  DATE OF SURGERY: 11/22/2014  PATIENT:  Sandra Mann,  64 y.o. female  PRE-OPERATIVE DIAGNOSIS:  left mandible abscess  POST-OPERATIVE DIAGNOSIS:  left mandible abscess  PROCEDURE:  Procedure(s): INCISION AND DRAINAGE LEFT NECK MANDIBLE ABSCESS  SURGEON:  Beckie Salts, MD  ASSISTANTS: none  ANESTHESIA:   General   EBL:  10 ml  DRAINS: 1/4 inch penrose  LOCAL MEDICATIONS USED:  None  SPECIMEN:  none  COUNTS:  Correct  PROCEDURE DETAILS: The patient was taken to the operating room and placed on the operating table in the supine position. Following induction of general endotracheal anesthesia left neck was prepped and draped in a standard fashion. The previous incision was identified and about one third of it was used and a left cautery used to incise the skin and subcutaneous tissue. Blunt dissection with hemostat was then accomplished to enter into the plane of the mandibular periosteum and up along the ascending ramus anteriorly where the abscess was localized. Thick pus was obtained and was sent for culture and sensitivity testing. He was set was used to open the abscess cavity widely. Mucosa was intact. Palpation inside the mouth simultaneously was used. The wound was irrigated with saline. A quarter-inch Penrose was placed into the depths of the wound. This was secured in place with a nylon suture. Dressing was applied. Patient was awakened exited and transferred to recovery in stable condition.    PATIENT DISPOSITION:  To PACU, stable

## 2014-11-22 NOTE — Anesthesia Procedure Notes (Signed)
Procedure Name: Intubation Date/Time: 11/22/2014 3:48 PM Performed by: Jenne Campus Pre-anesthesia Checklist: Patient identified, Emergency Drugs available, Suction available, Patient being monitored and Timeout performed Patient Re-evaluated:Patient Re-evaluated prior to inductionOxygen Delivery Method: Circle system utilized Preoxygenation: Pre-oxygenation with 100% oxygen Intubation Type: IV induction Grade View: Grade II Tube type: Oral Tube size: 7.0 mm Number of attempts: 1 Airway Equipment and Method: Stylet and Video-laryngoscopy Placement Confirmation: positive ETCO2,  CO2 detector and breath sounds checked- equal and bilateral Secured at: 20 cm Tube secured with: Tape Dental Injury: Teeth and Oropharynx as per pre-operative assessment

## 2014-11-22 NOTE — Anesthesia Preprocedure Evaluation (Addendum)
Anesthesia Evaluation  Patient identified by MRN, date of birth, ID band Patient awake    Reviewed: Allergy & Precautions, NPO status , Patient's Chart, lab work & pertinent test results  History of Anesthesia Complications (+) PONV  Airway    Neck ROM: Limited  Mouth opening: Limited Mouth Opening  Dental  (+) Teeth Intact   Pulmonary former smoker,  Left neck mandible abscess breath sounds clear to auscultation        Cardiovascular negative cardio ROS  Rhythm:Regular Rate:Normal     Neuro/Psych negative neurological ROS     GI/Hepatic Neg liver ROS, GERD-  ,  Endo/Other  negative endocrine ROS  Renal/GU negative Renal ROS     Musculoskeletal  (+) Arthritis -,   Abdominal   Peds  Hematology negative hematology ROS (+)   Anesthesia Other Findings   Reproductive/Obstetrics                            Anesthesia Physical Anesthesia Plan  ASA: III  Anesthesia Plan: General   Post-op Pain Management:    Induction: Intravenous and Rapid sequence  Airway Management Planned: Oral ETT  Additional Equipment:   Intra-op Plan:   Post-operative Plan: Extubation in OR  Informed Consent: I have reviewed the patients History and Physical, chart, labs and discussed the procedure including the risks, benefits and alternatives for the proposed anesthesia with the patient or authorized representative who has indicated his/her understanding and acceptance.   Dental advisory given  Plan Discussed with: CRNA  Anesthesia Plan Comments:         Anesthesia Quick Evaluation

## 2014-11-23 NOTE — Progress Notes (Signed)
11/23/2014 12:44 PM  York Pellant [id number]  Post-Op Day 1    Temp:  [97.4 F (36.3 C)-98.1 F (36.7 C)] 97.9 F (36.6 C) (01/23 0647) Pulse Rate:  [63-82] 64 (01/23 0647) Resp:  [16-19] 17 (01/23 0647) BP: (105-168)/(56-70) 117/57 mmHg (01/23 0647) SpO2:  [96 %-100 %] 96 % (01/23 0647),     Intake/Output Summary (Last 24 hours) at 11/23/14 1244 Last data filed at 11/23/14 0951  Gross per 24 hour  Intake 2264.25 ml  Output      0 ml  Net 2264.25 ml    Results for orders placed or performed during the hospital encounter of 11/21/14 (from the past 24 hour(s))  MRSA PCR Screening     Status: None   Collection Time: 11/22/14  3:00 PM  Result Value Ref Range   MRSA by PCR NEGATIVE NEGATIVE    SUBJECTIVE:  Min pain.  Full reg diet and activity.  C/o constipation.  Pt relates LEFT mandibular alveolar biopsy by Oral Surgeon one month ago and wonders if this could have caused the infection.    OBJECTIVE:  Mod neck swelling.  Drain secure.  Voice clear.  Breathing well.  IMPRESSION:  Satisfactory check  PLAN:  Continue Canasa suppository for constipation.  Await cultures, and plan for probable home IV abx.  Routine diet and activity.   Jodi Marble

## 2014-11-24 MED ORDER — BISACODYL 10 MG RE SUPP
10.0000 mg | Freq: Every day | RECTAL | Status: DC | PRN
  Administered 2014-11-24: 10 mg via RECTAL
  Filled 2014-11-24: qty 1

## 2014-11-24 NOTE — Progress Notes (Signed)
Last bm 1/21. Pt requests laxative.

## 2014-11-24 NOTE — Progress Notes (Signed)
Pt's penrose drain came out. I clipped the remaining suture and dressed the incision with gauze.

## 2014-11-24 NOTE — Progress Notes (Signed)
11/24/2014 12:02 PM  Sandra Mann [id number]  Post-Op Day 2    Temp:  [98 F (36.7 C)-98.4 F (36.9 C)] 98 F (36.7 C) (01/24 1005) Pulse Rate:  [60-74] 70 (01/24 1005) Resp:  [15-17] 16 (01/24 1005) BP: (93-114)/(45-64) 114/57 mmHg (01/24 1005) SpO2:  [94 %-100 %] 100 % (01/24 1005),     Intake/Output Summary (Last 24 hours) at 11/24/14 1202 Last data filed at 11/24/14 0900  Gross per 24 hour  Intake 1688.75 ml  Output      0 ml  Net 1688.75 ml   No culture thus far.  No results found for this or any previous visit (from the past 24 hour(s)).  SUBJECTIVE:  No pain.  Still no BM  OBJECTIVE:  Mod swelling along LEFT jaw.  Drain coming out on its own.  IMPRESSION:  Satisfactory check  PLAN:  Dulcolax for BM. Probable drain removal or advancement in AM.  [place] for prolonged IV abx.  May need PICC line.    Jodi Marble

## 2014-11-25 ENCOUNTER — Encounter (HOSPITAL_COMMUNITY): Payer: Self-pay | Admitting: Otolaryngology

## 2014-11-25 DIAGNOSIS — A491 Streptococcal infection, unspecified site: Secondary | ICD-10-CM

## 2014-11-25 DIAGNOSIS — M272 Inflammatory conditions of jaws: Principal | ICD-10-CM

## 2014-11-25 LAB — CULTURE, ROUTINE-ABSCESS

## 2014-11-25 MED ORDER — SODIUM CHLORIDE 0.9 % IJ SOLN
10.0000 mL | INTRAMUSCULAR | Status: DC | PRN
  Administered 2014-11-26: 10 mL
  Filled 2014-11-25: qty 40

## 2014-11-25 MED ORDER — CEFTRIAXONE SODIUM IN DEXTROSE 40 MG/ML IV SOLN
2.0000 g | INTRAVENOUS | Status: DC
  Administered 2014-11-25 – 2014-11-26 (×2): 2 g via INTRAVENOUS
  Filled 2014-11-25 (×3): qty 50

## 2014-11-25 NOTE — Progress Notes (Signed)
Peripherally Inserted Central Catheter/Midline Placement  The IV Nurse has discussed with the patient and/or persons authorized to consent for the patient, the purpose of this procedure and the potential benefits and risks involved with this procedure.  The benefits include less needle sticks, lab draws from the catheter and patient may be discharged home with the catheter.  Risks include, but not limited to, infection, bleeding, blood clot (thrombus formation), and puncture of an artery; nerve damage and irregular heat beat.  Alternatives to this procedure were also discussed.  PICC/Midline Placement Documentation  PICC / Midline Single Lumen 23/95/32 PICC Right Basilic 36 cm 0 cm (Active)       Sandra Mann 11/25/2014, 10:54 AM

## 2014-11-25 NOTE — Progress Notes (Signed)
Patient ID: Sandra Mann, female   DOB: 1951-02-23, 64 y.o.   MRN: [id number] Subjective: Feeling much better, drain fell out last night.  Objective: Vital signs in last 24 hours: Temp:  [97.9 F (36.6 C)-98.4 F (36.9 C)] 98.4 F (36.9 C) (01/25 0655) Pulse Rate:  [63-70] 64 (01/25 0655) Resp:  [16-18] 18 (01/25 0655) BP: (100-116)/(45-66) 100/63 mmHg (01/25 0655) SpO2:  [99 %-100 %] 100 % (01/25 0655) Weight change:  Last BM Date: 11/24/14  Intake/Output from previous day: 01/24 0701 - 01/25 0700 In: 1080 [P.O.:480; I.V.:600] Out: -  Intake/Output this shift:    PHYSICAL EXAM: Swelling down, trismus a little less. Purulent drainage on the bandage.  Lab Results: No results for input(s): WBC, HGB, HCT, PLT in the last 72 hours. BMET No results for input(s): NA, K, CL, CO2, GLUCOSE, BUN, CREATININE, CALCIUM in the last 72 hours.  Studies/Results: No results found.  Medications: I have reviewed the patient's current medications.  Assessment/Plan: Slowly improving. ID consult today. Will discuss with Dr.Truslow. Probable PICC line and then long term IV Abx. Also will consider HBO therapy.   LOS: 4 days   [place] Pelto 11/25/2014, 9:39 AM

## 2014-11-25 NOTE — Progress Notes (Signed)
Portal for Infectious Disease  Total days of antibiotics 4        Day 4 clindamycin               Reason for Consult: chronic mandibular osteomyelitis    Referring Physician: rosen  Active Problems:   Osteomyelitis of mandible    HPI: Sandra Mann is a 64 y.o. female with hx of oral cancer resection in May 2015 c/b left masseter mucle abscess that was treated with clindamycin, then IX D in July and August 2015 due to concerns for imaging c/w mandibular osteomyelitis. OR cx were polymicrobial and group D streptococcus, treated with ertapenem for 6 wk through late September 2015. She did well for a few months until December when there was new oral lesion that was resected, with path results c/w being benign growth. In mid January 2016, started to have left sided mandibular pain, thus admitted for I x D and evaluation of mandibular abscess vs. ongoing osteomyelitis. OR report details abscess with purulent drainage with incision. OR cultures identified microaerophilic strep species.  Patient reports to feeling better, less pain and swelling at jaw per chart review  Pt is not in room to interview  Past Medical History  Diagnosis Date  . GERD (gastroesophageal reflux disease)   . Wears glasses   . Oral mass     left  . PONV (postoperative nausea and vomiting) 1988    "after tubes tied"  . Rheumatoid arteritis   . 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 Sulfa Antibiotics Nausea And Vomiting  . Vicodin [Hydrocodone-Acetaminophen] Nausea And Vomiting  . Codeine Nausea And Vomiting    MEDICATIONS: . clindamycin (CLEOCIN) IV  600 mg Intravenous 4 times per day  . cycloSPORINE  1 drop Both Eyes BID  . folic acid  1 mg Oral Daily  . heparin  5,000 Units Subcutaneous 3 times per day  . mesalamine  1,000 mg Rectal QHS  . multivitamin with minerals  1 tablet Oral Daily  . pantoprazole  40 mg Oral Daily  . sodium chloride  3 mL Intravenous Q12H     History  Substance Use Topics  . Smoking status: Former Smoker -- 1.00 packs/day for 30 years    Types: Cigarettes    Quit date: 03/20/2000  . Smokeless tobacco: Never Used  . Alcohol Use: Yes     Comment: 05/28/2014 "drink a glass of wine ~ q night; none for the last week"    History reviewed. No pertinent family history.  Review of Systems  Unable to obtain  OBJECTIVE: Temp:  [97.9 F (36.6 C)-98.4 F (36.9 C)] 98.4 F (36.9 C) (01/25 0655) Pulse Rate:  [63-66] 64 (01/25 0655) Resp:  [16-18] 18 (01/25 0655) BP: (100-116)/(45-66) 100/63 mmHg (01/25 0655) SpO2:  [99 %-100 %] 100 % (01/25 0655) Did not examine  LABS: Lab Results  Component Value Date   WBC 14.8* 11/21/2014   HGB 13.5 11/21/2014   HCT 39.0 11/21/2014   MCV 97.0 11/21/2014   PLT 379 11/21/2014    MICRO: 12/22 abscess: microaerophilic strep IMAGING: No results found. 1/22 CT imaging IMPRESSION: 1. Inflammation/phlegmon in the left masticator space with inflammation extending into the left submandibular space and superficial soft tissues of the lower face and neck. New, small rim enhancing collection consistent with abscess along the posterior body and angle of the left mandible. Increased erosion of the mandible at this location is concerning  for progressive osteomyelitis. Superimposed tumor is not excluded. 2. Abscesses in the left temporalis muscle and along the lateral ramus/angle of the mandible on the prior study have resolved.  Assessment/Plan:  64yo F with mandibular abscess and progressive osteomyelitis of jaw. Cultures identified oral flora of microaerophilic strep as pathogen. She is currently on clindamycin  - recommend 4 wks of IV ceftriaxone - will get cbc with diff, bmp, sed rate and crp. As well as picc line placed - will have her follow up in ID clinic in 4 wk to determine to transition to orals or extension of IV antibiotics  Britiny Defrain [facility]  [facility] [phone]

## 2014-11-25 NOTE — Care Management Note (Signed)
  Page 1 of 1   11/25/2014     11:24:28 AM CARE MANAGEMENT NOTE 11/25/2014  Patient:  Sandra Mann, Sandra Mann   Account Number:  [id number]  Date Initiated:  11/25/2014  Documentation initiated by:  Magdalen Spatz  Subjective/Objective Assessment:     Action/Plan:   Anticipated DC Date:     Anticipated DC Plan:  [place]         Choice offered to / List presented to:  C-1 Patient        [place] arranged  HH-1 RN      [place].   Status of service:   Medicare Important Message given?   (If response is "NO", the following Medicare IM given date fields will be blank) Date Medicare IM given:   Medicare IM given by:   Date Additional Medicare IM given:   Additional Medicare IM given by:    Discharge Disposition:    Per UR Regulation:  Reviewed for med. necessity/level of care/duration of stay  If discussed at [place] of Stay Meetings, dates discussed:    Comments:  11-25-14 Possible discharge to home with PICC line and IV antibiotics . Confirmed face sheet information with patient. Patient has been at home with IV antibiotics in past , she would like [facility] .  [facility] aware. Magdalen Spatz RN BSN [phone]

## 2014-11-26 LAB — BASIC METABOLIC PANEL
ANION GAP: 12 (ref 5–15)
BUN: 11 mg/dL (ref 6–23)
CALCIUM: 8.8 mg/dL (ref 8.4–10.5)
CO2: 23 mmol/L (ref 19–32)
CREATININE: 0.76 mg/dL (ref 0.50–1.10)
Chloride: 100 mmol/L (ref 96–112)
GFR calc non Af Amer: 89 mL/min — ABNORMAL LOW (ref >90)
Glucose, Bld: 152 mg/dL — ABNORMAL HIGH (ref 70–99)
Potassium: 4 mmol/L (ref 3.5–5.1)
Sodium: 135 mmol/L (ref 135–145)

## 2014-11-26 LAB — CBC WITH DIFFERENTIAL/PLATELET
Basophils Absolute: 0 10*3/uL (ref 0.0–0.1)
Basophils Relative: 0 % (ref 0–1)
EOS PCT: 2 % (ref 0–5)
Eosinophils Absolute: 0.2 10*3/uL (ref 0.0–0.7)
HEMATOCRIT: 36.8 % (ref 36.0–46.0)
Hemoglobin: 12.3 g/dL (ref 12.0–15.0)
LYMPHS ABS: 1.9 10*3/uL (ref 0.7–4.0)
LYMPHS PCT: 18 % (ref 12–46)
MCH: 32.5 pg (ref 26.0–34.0)
MCHC: 33.4 g/dL (ref 30.0–36.0)
MCV: 97.4 fL (ref 78.0–100.0)
MONO ABS: 1.4 10*3/uL — AB (ref 0.1–1.0)
MONOS PCT: 14 % — AB (ref 3–12)
NEUTROS PCT: 66 % (ref 43–77)
Neutro Abs: 6.7 10*3/uL (ref 1.7–7.7)
PLATELETS: 290 10*3/uL (ref 150–400)
RBC: 3.78 MIL/uL — AB (ref 3.87–5.11)
RDW: 14.2 % (ref 11.5–15.5)
WBC: 10.2 10*3/uL (ref 4.0–10.5)

## 2014-11-26 LAB — C-REACTIVE PROTEIN: CRP: 5.6 mg/dL — ABNORMAL HIGH (ref <0.60)

## 2014-11-26 LAB — SEDIMENTATION RATE: SED RATE: 47 mm/h — AB (ref 0–22)

## 2014-11-26 MED ORDER — CEFTRIAXONE SODIUM 2 G IV SOLR: 2.0000 g | INTRAVENOUS | Status: DC

## 2014-11-26 NOTE — Discharge Summary (Signed)
  Physician Discharge Summary  Patient ID: Sandra Mann MRN: [id number] DOB/AGE: 11/17/1950 64 y.o.  Admit date: 11/21/2014 Discharge date: 11/26/2014  Admission Diagnoses:Osteomyelitis mandible  Discharge Diagnoses:  Active Problems:   Osteomyelitis of mandible   Discharged Condition: good  Hospital Course: improved after I&D  Consults: Infect Dis  Significant Diagnostic Studies: CT neck  Treatments: surgery: I&D masticator abscess  Discharge Exam: Blood pressure 113/64, pulse 67, temperature 98.5 F (36.9 C), temperature source Oral, resp. rate 18, height 5' 3.5" (1.613 m), weight 60.6 kg (133 lb 9.6 oz), SpO2 98 %. PHYSICAL EXAM: Drain out, swelling slowly improving.  Disposition: 01-Home or Self Care  Discharge Instructions    Diet - low sodium heart healthy    Complete by:  As directed      Increase activity slowly    Complete by:  As directed             Medication List    STOP taking these medications        methotrexate 2.5 MG tablet  Commonly known as:  RHEUMATREX      TAKE these medications        CANASA 1000 MG suppository  Generic drug:  mesalamine  Place 1,000 mg rectally at bedtime.     cefTRIAXone 2 G Solr injection  Commonly known as:  ROCEPHIN  Inject 2 g into the vein daily.     cycloSPORINE 0.05 % ophthalmic emulsion  Commonly known as:  RESTASIS  Place 1 drop into both eyes 2 (two) times daily.     DEXILANT 60 MG capsule  Generic drug:  dexlansoprazole  Take 60 mg by mouth every morning.     ertapenem 1 g in sodium chloride 0.9 % 50 mL  Inject 1 g into the vein daily.     folic acid 1 MG tablet  Commonly known as:  FOLVITE  Take 1 mg by mouth daily.     ibuprofen 200 MG tablet  Commonly known as:  ADVIL,MOTRIN  Take 600-800 mg by mouth every 6 (six) hours as needed for mild pain.     multivitamin with minerals Tabs tablet  Take 1 tablet by mouth daily.     promethazine 25 MG suppository  Commonly known as:   PHENERGAN  Place 1 suppository (25 mg total) rectally every 6 (six) hours as needed for nausea or vomiting.     valACYclovir 500 MG tablet  Commonly known as:  VALTREX  Take 500 mg by mouth 2 (two) times daily as needed (for fever blisters).           Follow-up Information    Follow up with Izora Gala, MD. Schedule an appointment as soon as possible for a visit in 1 week.   Specialty:  Otolaryngology   Contact information:   [address] [place] [address] [phone]       Signed: Izora Gala 11/26/2014, 12:20 PM

## 2014-11-27 LAB — ANAEROBIC CULTURE

## 2014-12-15 ENCOUNTER — Other Ambulatory Visit (HOSPITAL_COMMUNITY)
Admission: RE | Admit: 2014-12-15 | Discharge: 2014-12-15 | Disposition: A | Discharge status: Home / Self Care | Payer: 59 | Source: Ambulatory Visit | Attending: Infectious Diseases | Admitting: Infectious Diseases

## 2014-12-24 ENCOUNTER — Telehealth: Payer: Self-pay | Admitting: *Deleted

## 2014-12-24 ENCOUNTER — Encounter: Payer: Self-pay | Admitting: Internal Medicine

## 2014-12-24 ENCOUNTER — Ambulatory Visit (INDEPENDENT_AMBULATORY_CARE_PROVIDER_SITE_OTHER): Payer: 59 | Admitting: Internal Medicine

## 2014-12-24 VITALS — BP 128/71 | HR 77 | Temp 97.7°F | Wt 128.0 lb

## 2014-12-24 DIAGNOSIS — M272 Inflammatory conditions of jaws: Secondary | ICD-10-CM

## 2014-12-24 MED ORDER — AMOXICILLIN-POT CLAVULANATE 875-125 MG PO TABS: 1.0000 | ORAL_TABLET | Freq: Two times a day (BID) | ORAL | Status: DC

## 2014-12-24 NOTE — Telephone Encounter (Addendum)
Gave verbal order to Coretta at [facility] to have nurse pull PICC. Patient would like for this to be pulled 2/24. Patient aware.  Landis Gandy, RN

## 2014-12-24 NOTE — Progress Notes (Signed)
   Subjective:    Patient ID: Sandra Mann, female    DOB: 1951-09-18, 64 y.o.   MRN: [id number]  HPI She comes in for follow up.  She has a history of oral cancer with resection in May 2015 then masseter muscle abscess initially treated with clindamycin then in August noted imaging c/w mandibular osteomyelitis.  Cultures were polymicrobial and Streptococcus and I saw her and treated her with ertapenem for 6 weeks.  Then did well until January 2016 and developed pain and purulent drainage.  Went to OR again 1/22 for debridement and noted purulence and culture with microaerophilic strep.  Started on ceftriaxone for 4 weeks (now).  Initial CRP 5.6 and ESR 47 and follow up CRP and ESR 2/8 wnl.  Has seen Dr. Constance Holster and feels it is doing well.    Review of Systems  Constitutional: Negative for fever and chills.  Gastrointestinal: Negative for nausea, abdominal pain and diarrhea.  Skin: Negative for rash.  Neurological: Negative for dizziness and light-headedness.       Objective:   Physical Exam  Constitutional: She appears well-developed and well-nourished. No distress.  HENT:  Mouth/Throat: No oropharyngeal exudate.  No inflammation, no erythema  Eyes: No scleral icterus.  Skin: No rash noted.          Assessment & Plan:

## 2014-12-24 NOTE — Assessment & Plan Note (Signed)
She is doing well with her treatment for the Streptococcus. I will convert her to oral therapy and continue due to the recurrent nature and chronic nature of her infection. I will continue with Augmentin for 4 weeks and follow-up at that time. If she is still doing well and will have her stop it at that time.  She asked about her methotrexate as well and I would like to hold off on restarting that at this time for the next month if possible though it is not an absolute contraindication to hold the methotrexate and therefore if she symptomatically is worsening I think it would be reasonable to restart it.

## 2014-12-26 ENCOUNTER — Encounter: Payer: Self-pay | Admitting: Infectious Diseases

## 2015-01-15 ENCOUNTER — Other Ambulatory Visit: Payer: Self-pay | Admitting: Otolaryngology

## 2015-01-15 DIAGNOSIS — M272 Inflammatory conditions of jaws: Secondary | ICD-10-CM

## 2015-01-21 ENCOUNTER — Ambulatory Visit (INDEPENDENT_AMBULATORY_CARE_PROVIDER_SITE_OTHER): Payer: 59 | Admitting: Internal Medicine

## 2015-01-21 ENCOUNTER — Encounter: Payer: Self-pay | Admitting: Internal Medicine

## 2015-01-21 VITALS — BP 145/74 | HR 66 | Temp 97.8°F | Ht 63.5 in | Wt 125.0 lb

## 2015-01-21 DIAGNOSIS — M272 Inflammatory conditions of jaws: Secondary | ICD-10-CM

## 2015-01-21 MED ORDER — AMOXICILLIN-POT CLAVULANATE 875-125 MG PO TABS: 1.0000 | ORAL_TABLET | Freq: Two times a day (BID) | ORAL | Status: DC

## 2015-01-21 NOTE — Progress Notes (Signed)
   Subjective:    Patient ID: Sandra Mann, female    DOB: 11/08/1950, 64 y.o.   MRN: [id number]  HPI She comes in for follow up.  She has a history of oral cancer with resection in May 2015 then masseter muscle abscess initially treated with clindamycin then in August noted imaging c/w mandibular osteomyelitis.  Cultures were polymicrobial and Streptococcus and I saw her and treated her with ertapenem for 6 weeks.  Then did well until January 2016 and developed pain and purulent drainage.  Went to OR again 1/22 for debridement and noted purulence and culture with microaerophilic strep.  Started on ceftriaxone for 4 weeks and completed.  Initial CRP 5.6 and ESR 47 and follow up CRP and ESR 2/8 and 2/23 wnl.  I had her continue with Augmentin for 4 weeks and here now with no new complaints.      Has an MRI scheduled for 3/29.  Hopes to get tooth pulled after that.    Review of Systems  Constitutional: Negative for fever and chills.  Gastrointestinal: Negative for nausea, abdominal pain and diarrhea.  Skin: Negative for rash.  Neurological: Negative for dizziness and light-headedness.       Objective:   Physical Exam  Constitutional: She appears well-developed and well-nourished. No distress.  HENT:  Mouth/Throat: No oropharyngeal exudate.  No inflammation, no erythema  Eyes: No scleral icterus.  Skin: No rash noted.          Assessment & Plan:

## 2015-01-21 NOTE — Assessment & Plan Note (Signed)
She seems to be doing well and no concerns on labs. I will have her continue with antibiotic until I get the MRI report and if there is no concerns she can just continue with the antibiotic until she has her tooth pulled and stop a few days after that. If there are concerns MRI she'll be called. She did get a refill of her Augmentin. Otherwise she can return on a when necessary basis.

## 2015-01-28 ENCOUNTER — Ambulatory Visit
Admission: RE | Admit: 2015-01-28 | Discharge: 2015-01-28 | Disposition: A | Discharge status: Home / Self Care | Payer: 59 | Source: Ambulatory Visit | Attending: Otolaryngology | Admitting: Otolaryngology

## 2015-01-28 DIAGNOSIS — M272 Inflammatory conditions of jaws: Secondary | ICD-10-CM

## 2015-01-28 MED ORDER — GADOBENATE DIMEGLUMINE 529 MG/ML IV SOLN
10.0000 mL | Freq: Once | INTRAVENOUS | Status: AC | PRN
Start: 2015-01-28 — End: 2015-01-28
  Administered 2015-01-28: 10 mL via INTRAVENOUS

## 2015-02-14 ENCOUNTER — Encounter: Payer: Self-pay | Admitting: Infectious Diseases

## 2015-03-10 IMAGING — CT CT NECK W/ CM
4 of 5 series · 15 of 33 positions shown, 17 images · IV contrast (omnipaque)
Comparison: 06/10/2014 facial CT

CLINICAL DATA: Osteomyelitis of the mandible. Left facial/mandible
swelling beginning 3 days ago. History of left-sided oral cancer
resected in [DATE] with subsequent abscess.

EXAM:
CT NECK WITH CONTRAST
TECHNIQUE: Multidetector CT imaging of the neck was performed using the
standard protocol following the bolus administration of intravenous
contrast.
CONTRAST:  75mL OMNIPAQUE IOHEXOL 300 MG/ML  SOLN

[Series 2: neck 2.0 i31s 3 · axial · 0.49mm/px · z∈[-258,-102]mm · 4 of 131 slices shown, 5 images]
[im 27/131  soft-tissue]
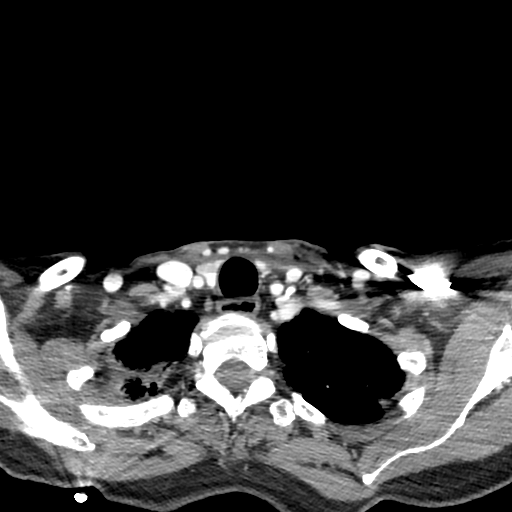
[im 27/131  bone]
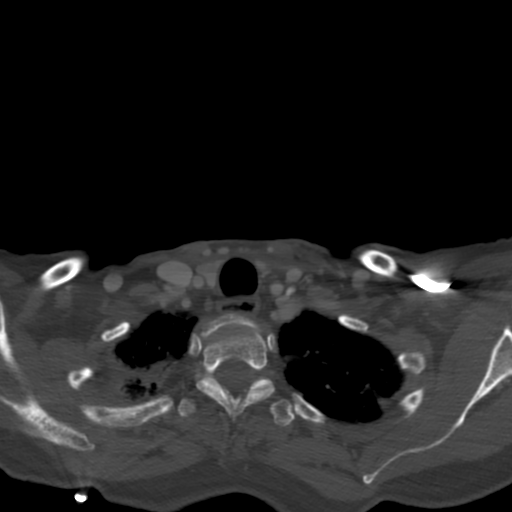
[im 53/131  bone]
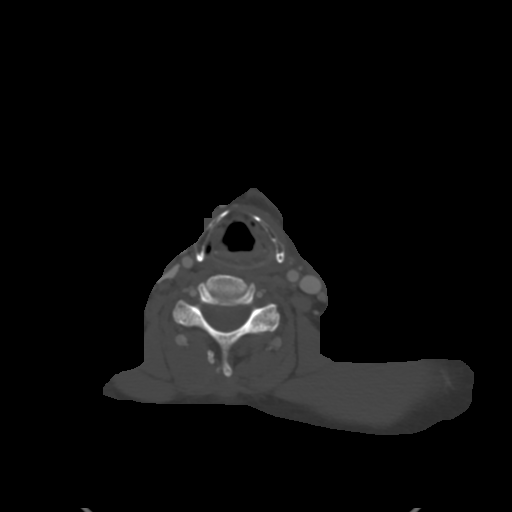
[im 79/131  bone]
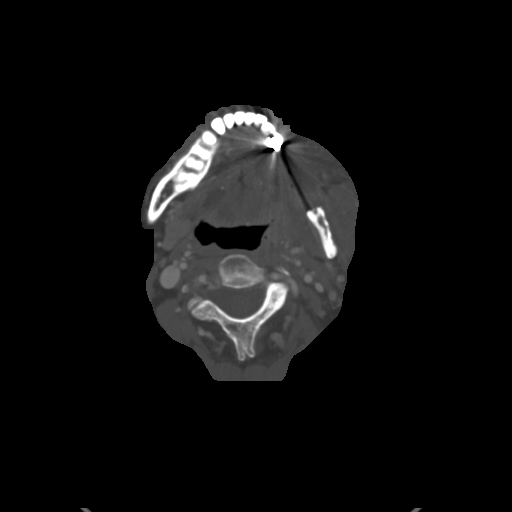
[im 105/131  bone]
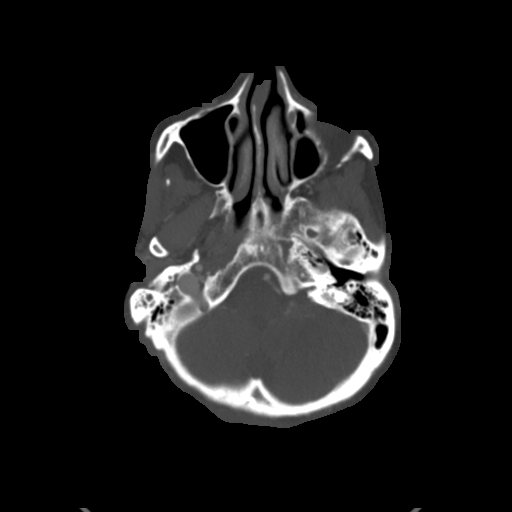

[Series 5: coronal st · coronal · 0.52mm/px · 3 of 115 slices shown]
[im 23/115  bone]
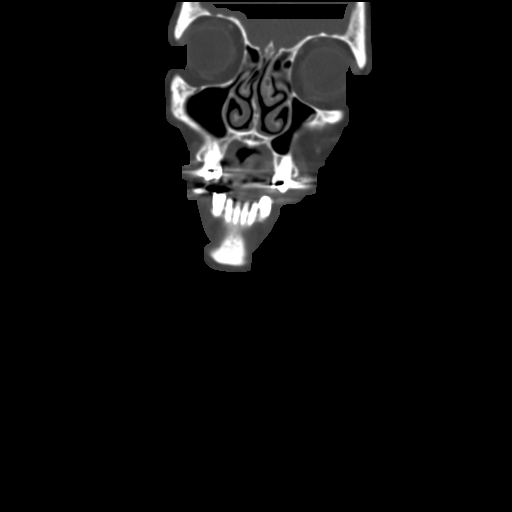
[im 46/115  bone]
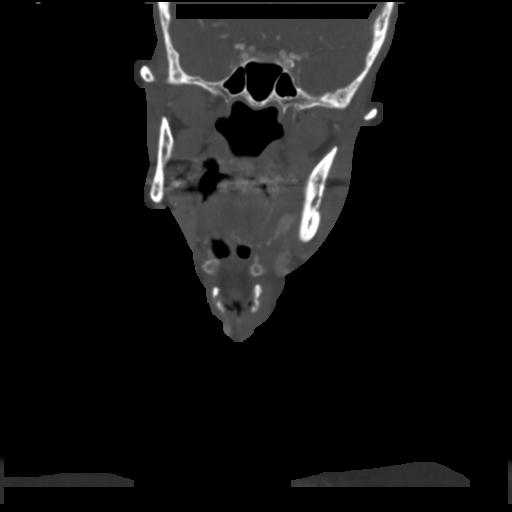
[im 69/115  bone]
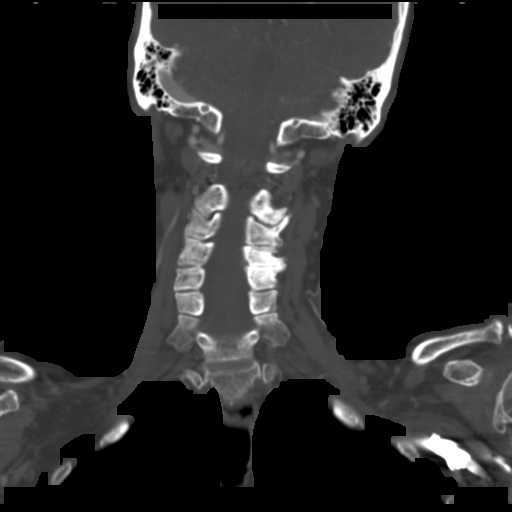

[Series 6: sagittal st · sagittal · 0.51mm/px · 5 of 99 slices shown, 6 images]
[im 33/99  bone]
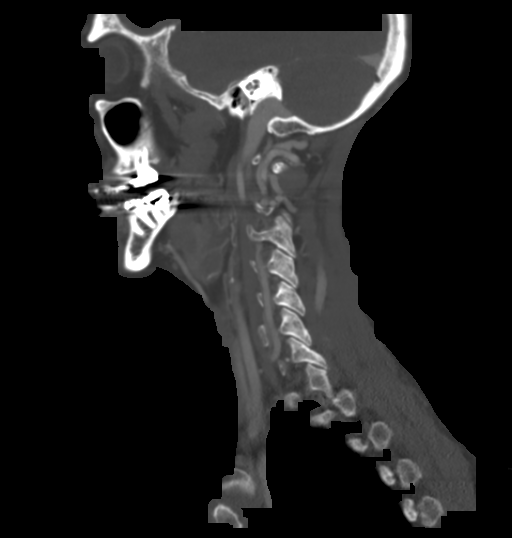
[im 41/99  bone]
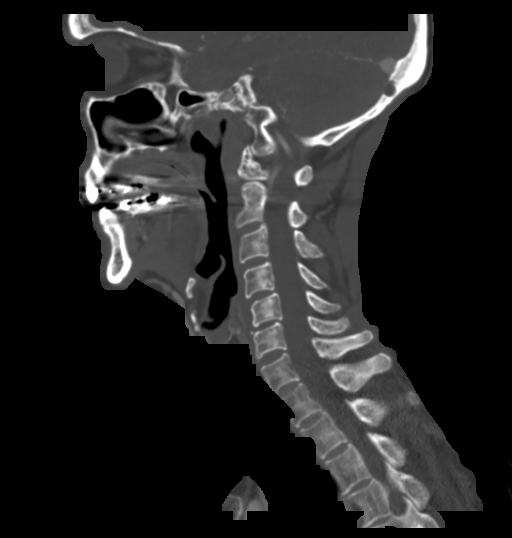
[im 50/99  soft-tissue]
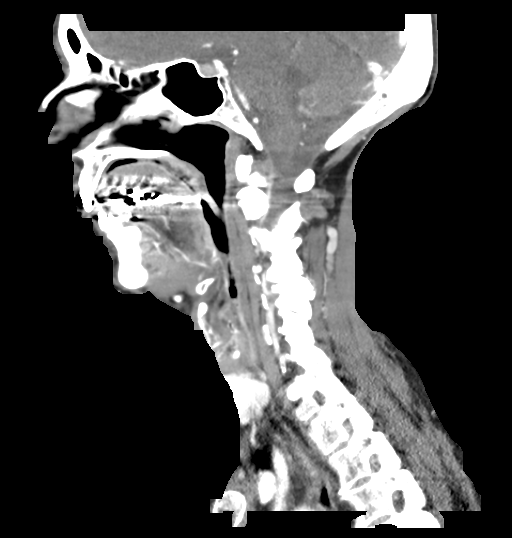
[im 50/99  bone]
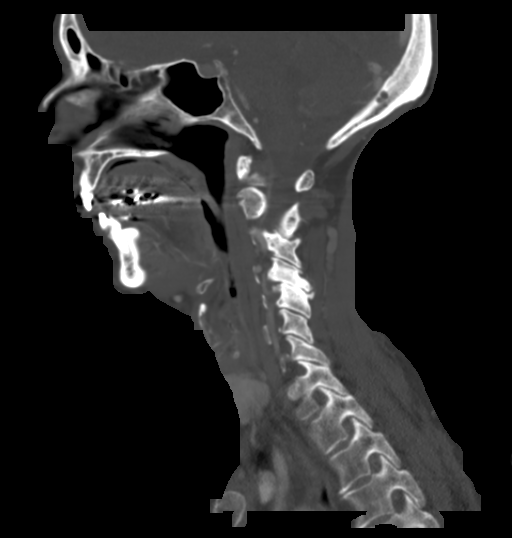
[im 58/99  bone]
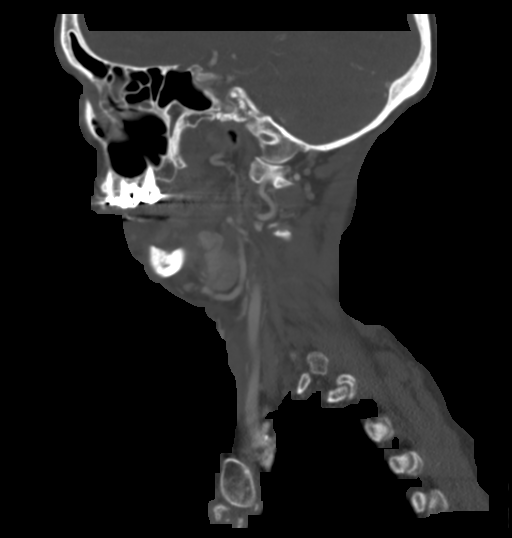
[im 66/99  bone]
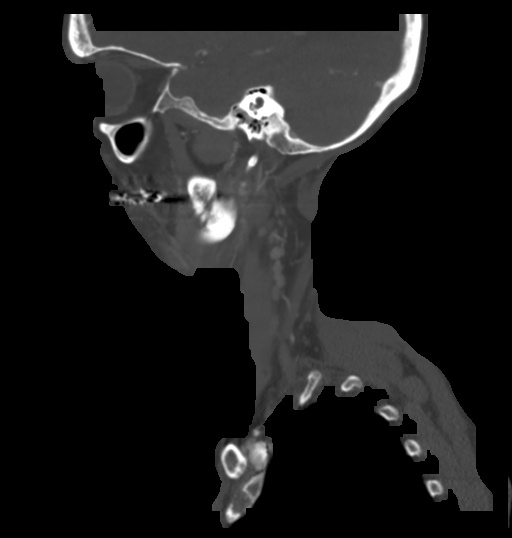

[Series 7: orthogonal st · axial · 0.40mm/px · z∈[-259,-155]mm · 3 of 130 slices shown]
[im 26/130  bone]
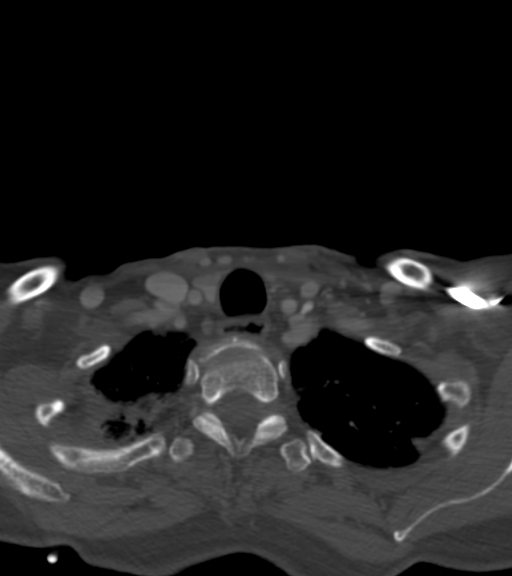
[im 52/130  bone]
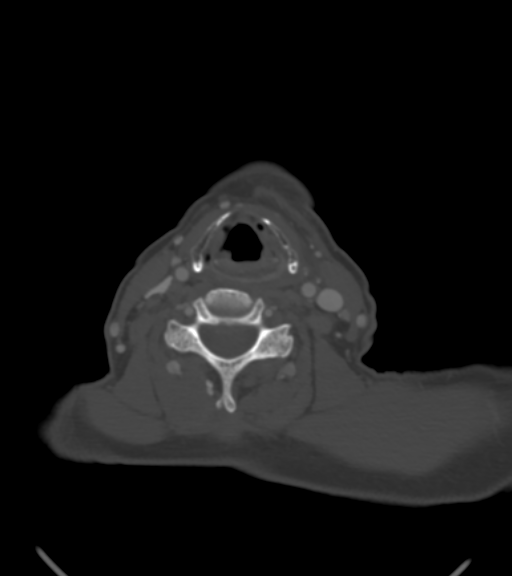
[im 78/130  bone]
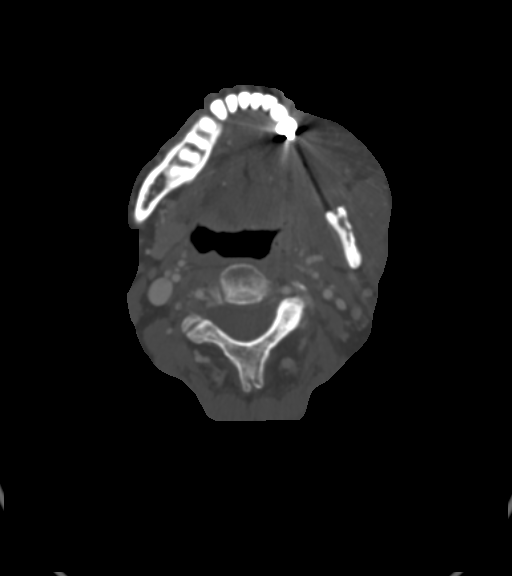

[15 of 33 positions shown; findings below may reference images not displayed]

FINDINGS: Pharynx and larynx: The nasopharynx is unremarkable. Thickening of
the left temporalis muscle has decreased, and the temporalis abscess
present on the prior study has resolved. Abscess along the lateral
margin of the lower ramus and angle of the mandible on the left on
the prior study has also resolved.

There is soft tissue thickening and heterogeneously increased
enhancement involving the left masticator muscle. There is a new,
thin low density rim enhancing collection along the posteroinferior
margin of the angle and posterior left mandibular alveolar ridge at
the prior resection site. The collection measures up to
approximately 12 x 9 mm with mildly increased osseous erosion of the
adjacent posterior body of the mandible (series 2, image 56).
Inflammation/phlegmonous changes are present throughout the left
masticator space extending into the left submandibular space and
into the superficial soft tissues of the lower face and upper neck
along the platysma, which is thickened. Larynx is unremarkable.

Salivary glands: Left submandibular gland appears mildly
hyperenhancing, likely secondary to adjacent inflammation. Right
submandibular gland and parotid glands are unremarkable.

Thyroid: Incidental 3 mm nodule in the posterior left thyroid lobe.

Lymph nodes: No enlarged lymph nodes are identified in the neck.
Mildly asymmetric but subcentimeter level II lymph nodes are noted
on the left.

Vascular: Major vascular structures of the neck are patent. Moderate
atherosclerosis is noted at the left carotid bifurcation.

Limited intracranial: The visualized portion of the brain and the
orbits are unremarkable.

Mastoids and visualized paranasal sinuses: There is opacification of
a posterior right ethmoid air cell, there is minimal right maxillary
sinus mucosal thickening. Trace right mastoid effusion is noted.

Skeleton: There is slight anterolisthesis of C2 on C3 and C4 and C5,
there is slight retrolisthesis of C5 on C6. These are likely facet
mediated, as there is severe left-sided facet arthrosis from C2-3 to
C4-5.

Upper chest: There is right greater than left pleural parenchymal
scarring in the lung apices.
IMPRESSION: 1. Inflammation/phlegmon in the left masticator space with
inflammation extending into the left submandibular space and
superficial soft tissues of the lower face and neck. New, small rim
enhancing collection consistent with abscess along the posterior
body and angle of the left mandible. Increased erosion of the
mandible at this location is concerning for progressive
osteomyelitis. Superimposed tumor is not excluded.
2. Abscesses in the left temporalis muscle and along the lateral
ramus/angle of the mandible on the prior study have resolved.

## 2016-07-14 ENCOUNTER — Other Ambulatory Visit: Payer: Self-pay

## 2016-07-20 ENCOUNTER — Emergency Department (HOSPITAL_COMMUNITY)
Admission: EM | Admit: 2016-07-20 | Discharge: 2016-07-21 | Disposition: A | Discharge status: Home / Self Care | Payer: 59 | Attending: Emergency Medicine | Admitting: Emergency Medicine

## 2016-07-20 ENCOUNTER — Encounter (HOSPITAL_COMMUNITY): Payer: Self-pay | Admitting: Emergency Medicine

## 2016-07-20 ENCOUNTER — Emergency Department (HOSPITAL_COMMUNITY): Payer: 59

## 2016-07-20 DIAGNOSIS — Z87891 Personal history of nicotine dependence: Secondary | ICD-10-CM | POA: Insufficient documentation

## 2016-07-20 DIAGNOSIS — M542 Cervicalgia: Secondary | ICD-10-CM | POA: Diagnosis not present

## 2016-07-20 DIAGNOSIS — R079 Chest pain, unspecified: Secondary | ICD-10-CM | POA: Diagnosis present

## 2016-07-20 DIAGNOSIS — Z85819 Personal history of malignant neoplasm of unspecified site of lip, oral cavity, and pharynx: Secondary | ICD-10-CM | POA: Insufficient documentation

## 2016-07-20 DIAGNOSIS — Z79899 Other long term (current) drug therapy: Secondary | ICD-10-CM | POA: Insufficient documentation

## 2016-07-20 DIAGNOSIS — R0789 Other chest pain: Secondary | ICD-10-CM

## 2016-07-20 LAB — CBC
HCT: 35.7 % — ABNORMAL LOW (ref 36.0–46.0)
Hemoglobin: 12.5 g/dL (ref 12.0–15.0)
MCH: 34.2 pg — AB (ref 26.0–34.0)
MCHC: 35 g/dL (ref 30.0–36.0)
MCV: 97.8 fL (ref 78.0–100.0)
PLATELETS: 208 10*3/uL (ref 150–400)
RBC: 3.65 MIL/uL — ABNORMAL LOW (ref 3.87–5.11)
RDW: 14.2 % (ref 11.5–15.5)
WBC: 8.2 10*3/uL (ref 4.0–10.5)

## 2016-07-20 LAB — TROPONIN I: Troponin I: <0.03 ng/mL (ref <0.03)

## 2016-07-20 LAB — COMPREHENSIVE METABOLIC PANEL
ALT: 18 U/L (ref 14–54)
AST: 29 U/L (ref 15–41)
Albumin: 4.2 g/dL (ref 3.5–5.0)
Alkaline Phosphatase: 62 U/L (ref 38–126)
Anion gap: 9 (ref 5–15)
BUN: 15 mg/dL (ref 6–20)
CALCIUM: 9.2 mg/dL (ref 8.9–10.3)
CO2: 25 mmol/L (ref 22–32)
CREATININE: 0.55 mg/dL (ref 0.44–1.00)
Chloride: 100 mmol/L — ABNORMAL LOW (ref 101–111)
GFR calc Af Amer: >60 mL/min (ref >60)
GFR calc non Af Amer: >60 mL/min (ref >60)
Glucose, Bld: 114 mg/dL — ABNORMAL HIGH (ref 65–99)
Potassium: 3.4 mmol/L — ABNORMAL LOW (ref 3.5–5.1)
SODIUM: 134 mmol/L — AB (ref 135–145)
Total Bilirubin: 1.4 mg/dL — ABNORMAL HIGH (ref 0.3–1.2)
Total Protein: 6.6 g/dL (ref 6.5–8.1)

## 2016-07-20 LAB — LIPASE, BLOOD: LIPASE: 42 U/L (ref 11–51)

## 2016-07-20 MED ORDER — FENTANYL CITRATE (PF) 100 MCG/2ML IJ SOLN
50.0000 ug | INTRAMUSCULAR | Status: DC | PRN
  Administered 2016-07-20: 50 ug via INTRAVENOUS

## 2016-07-20 MED ORDER — GI COCKTAIL ~~LOC~~
30.0000 mL | Freq: Once | ORAL | Status: AC
Start: 2016-07-20 — End: 2016-07-20
  Administered 2016-07-20: 30 mL via ORAL
  Filled 2016-07-20: qty 30

## 2016-07-20 MED ORDER — ASPIRIN 81 MG PO CHEW
324.0000 mg | CHEWABLE_TABLET | Freq: Once | ORAL | Status: AC
  Administered 2016-07-21: 324 mg via ORAL
  Filled 2016-07-20: qty 4

## 2016-07-20 MED ORDER — FENTANYL CITRATE (PF) 250 MCG/5ML IJ SOLN
INTRAMUSCULAR | Status: AC
  Filled 2016-07-20: qty 5

## 2016-07-20 MED ORDER — FAMOTIDINE 20 MG PO TABS
20.0000 mg | ORAL_TABLET | Freq: Once | ORAL | Status: AC
  Administered 2016-07-20: 20 mg via ORAL
  Filled 2016-07-20: qty 1

## 2016-07-20 NOTE — ED Notes (Signed)
Ambulated patient to the bathroom. Patient stated she was dizzy but was able to get to the restroom.

## 2016-07-20 NOTE — Discharge Instructions (Addendum)
If you were given medicines take as directed.  If you are on coumadin or contraceptives realize their levels and effectiveness is altered by many different medicines.  If you have any reaction (rash, tongues swelling, other) to the medicines stop taking and see a physician.    If your blood pressure was elevated in the ER make sure you follow up for management with a primary doctor or return for chest pain, shortness of breath or stroke symptoms.  Please follow up as directed and return to the ER or see a physician for new or worsening symptoms.  Thank you. Vitals:   07/20/16 2230 07/20/16 2300 07/20/16 2330 07/20/16 2342  BP: 105/74 124/67 115/71 115/71  Pulse: 70 74 73 67  Resp:    24  Temp:      TempSrc:      SpO2: 99% 100% 98% 99%  Weight:      Height:

## 2016-07-20 NOTE — ED Notes (Signed)
Pt states having epigastric/chest pain since 0230 this morning was seen by PCP today. Pt says only a little better at present.

## 2016-07-20 NOTE — ED Triage Notes (Signed)
Pt states she went to family doctor today, for chest pain, EKG was normal. Pt told to increase acid reflux medication. This is not helping.

## 2016-07-20 NOTE — ED Provider Notes (Signed)
"{ [facility]-EMERGENCY DEPT Provider Note   CSN: [id number] Arrival date & time: 07/20/16  2125  By signing my name below, I, Sandra Mann, attest that this documentation has been prepared under the direction and in the presence of Elnora Morrison, MD. Electronically Signed: Gwenlyn Mann, ED Scribe. 07/20/16. 10:37 PM.   History   Chief Complaint Chief Complaint  Patient presents with  . Chest Pain   The history is provided by the patient. No language interpreter was used.   HPI Comments: Sandra Mann is a 65 y.o. female with PMHx of GERD who presents to the Emergency Department complaining of gradual onset, constant, central chest pain onset 2:30 AM today. Pain does not radiate. Pt was seen by PCP at 4 PM and had a normal EKG and blood work. She was advised to increase the dose of her acid reflux medication. Increasing her acid reflux medication has not relieved symptoms. Pt reports associated neck pain and burping. She denies gallbladder and heart problems. She states she only drinks alcohol socially. Pt has not smoked for 16 years. She had a stomach ulcer a few years ago but has been taking medication since. Denies abdominal pain. No recent surgeries.  Past Medical History:  Diagnosis Date  . GERD (gastroesophageal reflux disease)   . Oral cancer ([place]) 01/2014  . Oral mass    left  . PONV (postoperative nausea and vomiting) 1988   "after tubes tied"  . Rheumatoid arteritis   . Wears glasses     Patient Active Problem List   Diagnosis Date Noted  . Infection of mandible 05/29/2014  . 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 COLONOSCOPY    . EXCISION ORAL TUMOR Left 03/27/2014   Procedure: EXCISION OF ORAL CANCER;  Surgeon: Izora Gala, MD;  Location: [place];  Service: ENT;  Laterality: Left;  . FINGER GANGLION CYST EXCISION Right 2007   small dip  . INCISION AND DRAINAGE ABSCESS Left 06/12/2014   Procedure:  LEFT INCISION AND DRAINAGE MANDIBULAR ABSCESS;  Surgeon: Izora Gala, MD;  Location: [place];  Service: ENT;  Laterality: Left;  . INCISION AND DRAINAGE ABSCESS Left 11/22/2014   Procedure: INCISION AND DRAINAGE LEFT  MANDIBULAR ABSCESS;  Surgeon: Izora Gala, MD;  Location: [facility];  Service: ENT;  Laterality: Left;  . INCISION AND DRAINAGE OF PERITONSILLAR ABCESS Left 06/01/2014   Procedure: INCISION AND DRAINAGE Masticator Space Infection;  Surgeon: Izora Gala, MD;  Location: [place];  Service: ENT;  Laterality: Left;  Marland Kitchen MULTIPLE EXTRACTIONS WITH ALVEOLOPLASTY Left 03/27/2014   Procedure: EXTRACTIONS OF NECESSARY TOOTH ;  Surgeon: Ceasar Mons, DDS;  Location: [facility];  Service: Oral Surgery;  Laterality: Left;  . TUBAL LIGATION  ~ 1988    OB History    No data available       Home Medications    Prior to Admission medications   Medication Sig Start Date End Date Taking? Authorizing Provider  cycloSPORINE (RESTASIS) 0.05 % ophthalmic emulsion Place 1 drop into both eyes 2 (two) times daily.   Yes Historical Provider, MD  estradiol (ESTRING) 2 MG vaginal ring Place 2 mg vaginally every 3 (three) months. follow package directions   Yes Historical Provider, MD  folic acid (FOLVITE) 1 MG tablet Take 1 mg by mouth daily.   Yes Historical Provider, MD  mesalamine (CANASA) 1000 MG suppository Place 1,000 mg rectally at bedtime.   Yes Historical  Provider, MD  methotrexate (RHEUMATREX) 2.5 MG tablet Take 20 mg by mouth every Tuesday at 6 PM. 4 tablets twice daily   Yes Historical Provider, MD  Multiple Vitamin (MULTIVITAMIN WITH MINERALS) TABS tablet Take 1 tablet by mouth daily.   Yes Historical Provider, MD  omeprazole (PRILOSEC) 40 MG capsule Take 40 mg by mouth 2 (two) times daily.   Yes Historical Provider, MD  valACYclovir (VALTREX) 500 MG tablet Take 500 mg by mouth 2 (two) times daily as needed (for fever blisters).  03/26/14  Yes Historical Provider, MD  ibuprofen  (ADVIL,MOTRIN) 200 MG tablet Take 600-800 mg by mouth every 6 (six) hours as needed for mild pain.     Historical Provider, MD    Family History No family history on file.  Social History Social History  Substance Use Topics  . Smoking status: Former Smoker    Packs/day: 1.00    Years: 30.00    Types: Cigarettes    Quit date: 03/20/2000  . Smokeless tobacco: Never Used  . Alcohol use Yes     Comment: 05/28/2014 "drink a glass of wine ~ q night; none for the last week"     Allergies   Sulfa antibiotics; Vicodin [hydrocodone-acetaminophen]; and Codeine   Review of Systems Review of Systems  Constitutional: Negative for fever.  Cardiovascular: Positive for chest pain.  Gastrointestinal: Negative for abdominal pain.       + Increased burping  Musculoskeletal: Positive for neck pain.  All other systems reviewed and are negative.  Physical Exam Updated Vital Signs BP 115/71 (BP Location: Left Arm)   Pulse 67   Temp 98 F (36.7 C) (Oral)   Resp 24   Ht 5\' 3"  (1.6 m)   Wt 125 lb (56.7 kg)   SpO2 99%   BMI 22.14 kg/m   Physical Exam  Constitutional: She is oriented to person, place, and time. She appears well-developed.  HENT:  Head: Normocephalic.  Eyes: Conjunctivae and EOM are normal. No scleral icterus.  Neck: Neck supple. No thyromegaly present.  Cardiovascular: Normal rate, regular rhythm and normal heart sounds.  Exam reveals no gallop and no friction rub.   No murmur heard. Pulmonary/Chest: Effort normal and breath sounds normal. No stridor. She has no wheezes. She has no rales. She exhibits no tenderness.  Abdominal: Soft. She exhibits no distension. There is no tenderness. There is no rebound.  Musculoskeletal: Normal range of motion. She exhibits no edema.  Lymphadenopathy:    She has no cervical adenopathy.  Neurological: She is oriented to person, place, and time. She exhibits normal muscle tone. Coordination normal.  Skin: No rash noted. No erythema.    Psychiatric: She has a normal mood and affect. Her behavior is normal.   ED Treatments / Results  DIAGNOSTIC STUDIES: Oxygen Saturation is 100% on RA, normal by my interpretation.    COORDINATION OF CARE: 10:30 PM Discussed treatment plan with pt at bedside which includes monitoring cardiac enzymes and lab work and pt agreed to plan.  Labs (all labs ordered are listed, but only abnormal results are displayed) Labs Reviewed  COMPREHENSIVE METABOLIC PANEL - Abnormal; Notable for the following:       Result Value   Sodium 134 (*)    Potassium 3.4 (*)    Chloride 100 (*)    Glucose, Bld 114 (*)    Total Bilirubin 1.4 (*)    All other components within normal limits  CBC - Abnormal; Notable for the following:  RBC 3.65 (*)    HCT 35.7 (*)    MCH 34.2 (*)    All other components within normal limits  TROPONIN I  LIPASE, BLOOD  TROPONIN I    EKG  EKG Interpretation  Date/Time:  Tuesday July 20 2016 21:38:48 EDT Ventricular Rate:  81 PR Interval:  132 QRS Duration: 80 QT Interval:  348 QTC Calculation: 404 R Axis:   -168 Text Interpretation:   Suspect arm lead reversal, interpretation assumes no reversal Normal sinus rhythm Lateral infarct , age undetermined Inferior infarct , age undetermined Abnormal ECG inferior Q waves are new PR depression Confirmed by RANCOUR  MD, STEPHEN [id number]) on 07/20/2016 9:46:43 PM      EKG repeat heart rate 72, sinus, nonspecific ST changes, normal QT, no ST depression.  Radiology Dg Chest 2 View  Result Date: 07/20/2016 CLINICAL DATA:  Burning painful sensation in the center of the chest. Mild shortness of breath. Former smoker. EXAM: CHEST  2 VIEW COMPARISON:  11/29/2007 FINDINGS: Hyperinflation suggesting emphysematous change. Apical pleural thickening. Focal infiltration or atelectasis in the left lung base anteriorly may represent focal pneumonia. No blunting of costophrenic angles. No pneumothorax. Normal heart size and pulmonary  vascularity. IMPRESSION: Emphysematous changes in the lungs. Focal infiltration or atelectasis in the left lung base anteriorly. Electronically Signed   By: Lucienne Capers M.D.   On: 07/20/2016 22:31    Procedures Procedures (including critical care time)  Medications Ordered in ED Medications  fentaNYL (SUBLIMAZE) injection 50 mcg (50 mcg Intravenous Given 07/20/16 2343)  famotidine (PEPCID) tablet 20 mg (20 mg Oral Given 07/20/16 2255)  gi cocktail (Maalox,Lidocaine,Donnatal) (30 mLs Oral Given 07/20/16 2341)     Initial Impression / Assessment and Plan / ED Course  I have reviewed the triage vital signs and the nursing notes.  Pertinent labs & imaging results that were available during my care of the patient were reviewed by me and considered in my medical decision making (see chart for details).  Clinical Course  Patient presents with clinical concern for reflux/GI as cause of chest pain.  With age and history of smoking plan for delta troponin in case atypical presentation. Patient improved on reassessment. Plan to reassess and follow-up second troponin with likely outpatient follow-up with cardiology and primary doctor. GI cocktail given in the ER.  Initial EKG likely lead placement abnormality. Repeat EKG performed. Delta troponin if neg close fup outpt.  Sxs resolved in ED  Final Clinical Impressions(s) / ED Diagnoses   Final diagnoses:  Burning chest pain    New Prescriptions New Prescriptions   No medications on file     Elnora Morrison, MD 07/23/16 1451

## 2016-07-21 ENCOUNTER — Encounter: Payer: Self-pay | Admitting: Interventional Cardiology

## 2016-07-21 LAB — TROPONIN I: Troponin I: <0.03 ng/mL (ref <0.03)

## 2016-07-21 NOTE — ED Provider Notes (Signed)
1:05 AM  Assumed care from Dr. Reather Converse.  Pt is a 65 y.o. female with history of GERD who presents to the emergency department with central chest pain that started at 2:30 AM yesterday. Pain has been constant. Described as a burning pain. She is on Prilosec at home and had no relief. No shortness of breath, nausea or vomiting. Given GI cocktail in the emergency department and symptoms have completely resolved. She has had 2 negative troponins. EKGs did have some scooping of the ST segment in the inferior leads but has remained consistent. No ST elevation. Chest pain thought to be gastric in nature. Patient would like discharge home. She remains he was dynamically stable. Plan was to discharge with close outpatient follow-up with Dr. branch for outpatient stress test. Discussed return cautions with patient and her husband. They're comfortable with this plan for discharge home.    EKG Interpretation  Date/Time:  Wednesday July 21 2016 00:41:44 EDT Ventricular Rate:  71 PR Interval:  132 QRS Duration: 79 QT Interval:  376 QTC Calculation: 409 R Axis:   56 Text Interpretation:  Sinus rhythm Consider left atrial enlargement ST elevation, consider inferior injury No significant change since last tracing Confirmed by WARD,  DO, KRISTEN [phone]) on 07/21/2016 12:47:22 AM         Paulina, DO 07/21/16 0136

## 2016-07-21 NOTE — ED Notes (Signed)
Ambulated patient back to the room she stated that she is getting sick. Made nurse aware.

## 2016-07-22 ENCOUNTER — Encounter: Payer: Self-pay | Admitting: Interventional Cardiology

## 2016-07-22 ENCOUNTER — Ambulatory Visit (INDEPENDENT_AMBULATORY_CARE_PROVIDER_SITE_OTHER): Payer: 59 | Admitting: Interventional Cardiology

## 2016-07-22 VITALS — BP 100/60 | HR 72 | Ht 63.0 in | Wt 124.4 lb

## 2016-07-22 DIAGNOSIS — K219 Gastro-esophageal reflux disease without esophagitis: Secondary | ICD-10-CM

## 2016-07-22 DIAGNOSIS — R0789 Other chest pain: Secondary | ICD-10-CM | POA: Diagnosis not present

## 2016-07-22 NOTE — Progress Notes (Signed)
Cardiology Office Note   Date:  07/22/2016   ID:  Sandra Mann, DOB 10/02/1951, MRN [id number]  PCP:  Heywood Bene, PA-C    No chief complaint on file. Chest discomfort   Wt Readings from Last 3 Encounters:  07/22/16 124 lb 6.4 oz (56.4 kg)  07/20/16 125 lb (56.7 kg)  01/21/15 125 lb (56.7 kg)       History of Present Illness: Sandra Mann is a 65 y.o. female  who has a history of GERD. Tuesday morning at about 2:30 AM, she woke up with burning in her chest. It went on for several hours. She saw her doctor who felt it was GERD and increased her PPI.  Her discomfort persisted. She went to the [facility] emergency room. She had a negative cardiac workup there. They did request that she follow up with cardiologist and have a stress test.  In the day since the ER visit happened, she has continued to have constant discomfort. She has been unable to sleep. She does not think she can walk on a treadmill because she is so uncomfortable. She tried a medication in the ER that was difficult her stomach without any relief.    Past Medical History:  Diagnosis Date  . Chest pain   . GERD (gastroesophageal reflux disease)   . Oral cancer ([place]) 01/2014  . Oral mass    left  . PONV (postoperative nausea and vomiting) 1988   "after tubes tied"  . Rheumatoid arteritis   . Wears glasses     Past Surgical History:  Procedure Laterality Date  . COLONOSCOPY    . EXCISION ORAL TUMOR Left 03/27/2014   Procedure: EXCISION OF ORAL CANCER;  Surgeon: Izora Gala, MD;  Location: [place];  Service: ENT;  Laterality: Left;  . FINGER GANGLION CYST EXCISION Right 2007   small dip  . INCISION AND DRAINAGE ABSCESS Left 06/12/2014   Procedure: LEFT INCISION AND DRAINAGE MANDIBULAR ABSCESS;  Surgeon: Izora Gala, MD;  Location: [place];  Service: ENT;  Laterality: Left;  . INCISION AND DRAINAGE ABSCESS Left 11/22/2014   Procedure: INCISION AND DRAINAGE LEFT  MANDIBULAR  ABSCESS;  Surgeon: Izora Gala, MD;  Location: [place];  Service: ENT;  Laterality: Left;  . INCISION AND DRAINAGE OF PERITONSILLAR ABCESS Left 06/01/2014   Procedure: INCISION AND DRAINAGE Masticator Space Infection;  Surgeon: Izora Gala, MD;  Location: [place];  Service: ENT;  Laterality: Left;  Marland Kitchen MULTIPLE EXTRACTIONS WITH ALVEOLOPLASTY Left 03/27/2014   Procedure: EXTRACTIONS OF NECESSARY TOOTH ;  Surgeon: Ceasar Mons, DDS;  Location: [place];  Service: Oral Surgery;  Laterality: Left;  . TUBAL LIGATION  ~ 1988     Current Outpatient Prescriptions  Medication Sig Dispense Refill  . cycloSPORINE (RESTASIS) 0.05 % ophthalmic emulsion Place 1 drop into both eyes 2 (two) times daily.    Marland Kitchen estradiol (ESTRING) 2 MG vaginal ring Place 2 mg vaginally every 3 (three) months. follow package directions    . folic acid (FOLVITE) 1 MG tablet Take 1 mg by mouth daily.    Marland Kitchen ibuprofen (ADVIL,MOTRIN) 200 MG tablet Take 600-800 mg by mouth every 6 (six) hours as needed for mild pain.     . mesalamine (CANASA) 1000 MG suppository Place 1,000 mg rectally at bedtime.    . methotrexate (RHEUMATREX) 2.5 MG tablet Take 20 mg by mouth every Tuesday at 6 PM. 4 tablets twice daily    .  Multiple Vitamin (MULTIVITAMIN WITH MINERALS) TABS tablet Take 1 tablet by mouth daily.    Marland Kitchen omeprazole (PRILOSEC) 40 MG capsule Take 40 mg by mouth 2 (two) times daily.    . valACYclovir (VALTREX) 500 MG tablet Take 500 mg by mouth 2 (two) times daily as needed (for fever blisters).      No current facility-administered medications for this visit.     Allergies:   Codeine; Sulfa antibiotics; and Vicodin [hydrocodone-acetaminophen]    Social History:  The patient  reports that she quit smoking about 16 years ago. Her smoking use included Cigarettes. She has a 30.00 pack-year smoking history. She has never used smokeless tobacco. She reports that she drinks alcohol. She reports that she does not use drugs.   Family  History:  The patient's family history includes Diabetes in her maternal grandmother; Heart attack in her mother.    ROS:  Please see the history of present illness.   Otherwise, review of systems are positive for Chest burning.   All other systems are reviewed and negative.    PHYSICAL EXAM: VS:  BP 100/60   Pulse 72   Ht 5' 3"  (1.6 m)   Wt 124 lb 6.4 oz (56.4 kg)   BMI 22.04 kg/m  , BMI Body mass index is 22.04 kg/m. GEN: Well nourished, well developed, in no acute distress  HEENT: normal  Neck: no JVD, carotid bruits, or masses Cardiac: RRR; no murmurs, rubs, or gallops,no edema  Respiratory:  clear to auscultation bilaterally, normal work of breathing GI: soft, nontender, nondistended, + BS MS: no deformity or atrophy  Skin: warm and dry, no rash Neuro:  Strength and sensation are intact Psych: euthymic mood, full affect   EKG:   The ekg ordered today demonstrates normal sinus rhythm, inferolateral ST changes consistent with early repolarization. This change is less pronounced compared to the prior ECG from the ER at [facility]. No Q waves.   Recent Labs: 07/20/2016: ALT 18; BUN 15; Creatinine, Ser 0.55; Hemoglobin 12.5; Platelets 208; Potassium 3.4; Sodium 134   Lipid Panel No results found for: CHOL, TRIG, HDL, CHOLHDL, VLDL, LDLCALC, LDLDIRECT   Other studies Reviewed: Additional studies/ records that were reviewed today with results demonstrating: Negative cardiac enzymes and no significant ischemic changes by ECG noted from the [facility] ER.   ASSESSMENT AND PLAN:  1. Chest discomfort: I think this is clearly GI related pain. She has had no change in her ECG despite several days of continuous discomfort. She continues to belch regularly. Cardiac pain would not last for days continuously. She will follow up with GI. I don't think we have to do a stress test at this time. This could be done later if necessary after her GI discomfort is improved. 2. The patient is  comfortable with this plan. If symptoms change, we can also reconsider cardiac testing. She will follow-up with Dr. Collene Mares. 3. I did suggest that she could add Pepcid to her PPI regimen. She could also try probiotic. Of note, she had taken an antibiotic recently for a UTI.   Current medicines are reviewed at length with the patient today.  The patient concerns regarding her medicines were addressed.  The following changes have been made:  No change  Labs/ tests ordered today include:  No orders of the defined types were placed in this encounter.   Recommend 150 minutes/week of aerobic exercise Low fat, low carb, high fiber diet recommended  Disposition:   FU prn  Signed, Larae Grooms, MD  07/22/2016 3:26 PM    [facility] [address], [place], [place]  [address] Phone: [phone]; Fax: [phone]

## 2016-07-22 NOTE — Patient Instructions (Signed)
**Note De-Identified  Obfuscation** Medication Instructions:  Same-no changes  Labwork: None  Testing/Procedures: None  Follow-Up: As needed  Any Other Special Instructions Will Be Listed Below (If Applicable). Dr Irish Lack recommends that you try Pepcid (over the counter) for your stomach/chest discomfort.    If you need a refill on your cardiac medications before your next appointment, please call your pharmacy.

## 2016-07-28 ENCOUNTER — Encounter: Payer: Self-pay | Admitting: Interventional Cardiology

## 2016-08-09 ENCOUNTER — Other Ambulatory Visit: Payer: Self-pay | Admitting: Obstetrics and Gynecology

## 2016-08-13 ENCOUNTER — Encounter: Payer: 59 | Admitting: Cardiovascular Disease

## 2016-08-24 ENCOUNTER — Ambulatory Visit: Payer: 59 | Admitting: Cardiology

## 2016-08-31 ENCOUNTER — Other Ambulatory Visit: Payer: Self-pay | Admitting: Obstetrics and Gynecology

## 2016-10-04 ENCOUNTER — Other Ambulatory Visit: Payer: 59

## 2017-01-21 ENCOUNTER — Other Ambulatory Visit (INDEPENDENT_AMBULATORY_CARE_PROVIDER_SITE_OTHER): Payer: 59

## 2018-01-10 DIAGNOSIS — K219 Gastro-esophageal reflux disease without esophagitis: Secondary | ICD-10-CM | POA: Diagnosis not present

## 2018-01-10 DIAGNOSIS — K5120 Ulcerative (chronic) proctitis without complications: Secondary | ICD-10-CM | POA: Diagnosis not present

## 2018-01-12 DIAGNOSIS — M79642 Pain in left hand: Secondary | ICD-10-CM | POA: Diagnosis not present

## 2018-01-12 DIAGNOSIS — Z79899 Other long term (current) drug therapy: Secondary | ICD-10-CM | POA: Diagnosis not present

## 2018-01-12 DIAGNOSIS — Z6822 Body mass index (BMI) 22.0-22.9, adult: Secondary | ICD-10-CM | POA: Diagnosis not present

## 2018-01-12 DIAGNOSIS — M79641 Pain in right hand: Secondary | ICD-10-CM | POA: Diagnosis not present

## 2018-01-12 DIAGNOSIS — M0579 Rheumatoid arthritis with rheumatoid factor of multiple sites without organ or systems involvement: Secondary | ICD-10-CM | POA: Diagnosis not present

## 2018-04-14 DIAGNOSIS — M0579 Rheumatoid arthritis with rheumatoid factor of multiple sites without organ or systems involvement: Secondary | ICD-10-CM | POA: Diagnosis not present

## 2018-07-12 DIAGNOSIS — M8588 Other specified disorders of bone density and structure, other site: Secondary | ICD-10-CM | POA: Diagnosis not present

## 2018-07-12 DIAGNOSIS — N3281 Overactive bladder: Secondary | ICD-10-CM | POA: Diagnosis not present

## 2018-07-12 DIAGNOSIS — N952 Postmenopausal atrophic vaginitis: Secondary | ICD-10-CM | POA: Diagnosis not present

## 2018-07-12 DIAGNOSIS — Z1231 Encounter for screening mammogram for malignant neoplasm of breast: Secondary | ICD-10-CM | POA: Diagnosis not present

## 2018-07-12 DIAGNOSIS — Z1211 Encounter for screening for malignant neoplasm of colon: Secondary | ICD-10-CM | POA: Diagnosis not present

## 2018-07-12 DIAGNOSIS — Z6821 Body mass index (BMI) 21.0-21.9, adult: Secondary | ICD-10-CM | POA: Diagnosis not present

## 2018-07-12 DIAGNOSIS — B009 Herpesviral infection, unspecified: Secondary | ICD-10-CM | POA: Diagnosis not present

## 2018-07-12 DIAGNOSIS — N390 Urinary tract infection, site not specified: Secondary | ICD-10-CM | POA: Diagnosis not present

## 2018-07-12 DIAGNOSIS — Z01419 Encounter for gynecological examination (general) (routine) without abnormal findings: Secondary | ICD-10-CM | POA: Diagnosis not present

## 2018-07-12 DIAGNOSIS — R3915 Urgency of urination: Secondary | ICD-10-CM | POA: Diagnosis not present

## 2018-07-12 DIAGNOSIS — M8580 Other specified disorders of bone density and structure, unspecified site: Secondary | ICD-10-CM | POA: Diagnosis not present

## 2018-07-14 DIAGNOSIS — M79642 Pain in left hand: Secondary | ICD-10-CM | POA: Diagnosis not present

## 2018-07-14 DIAGNOSIS — M79641 Pain in right hand: Secondary | ICD-10-CM | POA: Diagnosis not present

## 2018-07-14 DIAGNOSIS — Z79899 Other long term (current) drug therapy: Secondary | ICD-10-CM | POA: Diagnosis not present

## 2018-07-14 DIAGNOSIS — M0579 Rheumatoid arthritis with rheumatoid factor of multiple sites without organ or systems involvement: Secondary | ICD-10-CM | POA: Diagnosis not present

## 2018-07-14 DIAGNOSIS — Z6821 Body mass index (BMI) 21.0-21.9, adult: Secondary | ICD-10-CM | POA: Diagnosis not present

## 2018-07-24 DIAGNOSIS — H2513 Age-related nuclear cataract, bilateral: Secondary | ICD-10-CM | POA: Diagnosis not present

## 2018-07-24 DIAGNOSIS — H16223 Keratoconjunctivitis sicca, not specified as Sjogren's, bilateral: Secondary | ICD-10-CM | POA: Diagnosis not present

## 2018-07-31 DIAGNOSIS — M5432 Sciatica, left side: Secondary | ICD-10-CM | POA: Diagnosis not present

## 2018-08-22 DIAGNOSIS — R3915 Urgency of urination: Secondary | ICD-10-CM | POA: Diagnosis not present

## 2018-09-08 DIAGNOSIS — M5136 Other intervertebral disc degeneration, lumbar region: Secondary | ICD-10-CM | POA: Diagnosis not present

## 2018-09-08 DIAGNOSIS — M5137 Other intervertebral disc degeneration, lumbosacral region: Secondary | ICD-10-CM | POA: Diagnosis not present

## 2018-09-08 DIAGNOSIS — M549 Dorsalgia, unspecified: Secondary | ICD-10-CM | POA: Diagnosis not present

## 2018-09-08 DIAGNOSIS — R030 Elevated blood-pressure reading, without diagnosis of hypertension: Secondary | ICD-10-CM | POA: Diagnosis not present

## 2018-09-08 DIAGNOSIS — M546 Pain in thoracic spine: Secondary | ICD-10-CM | POA: Diagnosis not present

## 2018-09-08 DIAGNOSIS — M5416 Radiculopathy, lumbar region: Secondary | ICD-10-CM | POA: Diagnosis not present

## 2018-09-08 DIAGNOSIS — M4316 Spondylolisthesis, lumbar region: Secondary | ICD-10-CM | POA: Diagnosis not present

## 2018-10-13 DIAGNOSIS — M0579 Rheumatoid arthritis with rheumatoid factor of multiple sites without organ or systems involvement: Secondary | ICD-10-CM | POA: Diagnosis not present

## 2020-09-18 ENCOUNTER — Other Ambulatory Visit: Payer: Self-pay | Admitting: Urology

## 2020-09-18 ENCOUNTER — Encounter (HOSPITAL_COMMUNITY): Payer: Self-pay

## 2020-09-18 ENCOUNTER — Other Ambulatory Visit (HOSPITAL_COMMUNITY): Payer: Self-pay | Admitting: Urology

## 2020-09-18 DIAGNOSIS — N369 Urethral disorder, unspecified: Secondary | ICD-10-CM

## 2020-09-18 NOTE — Patient Instructions (Addendum)
DUE TO COVID-19 ONLY ONE VISITOR IS ALLOWED TO COME WITH YOU AND STAY IN THE WAITING ROOM ONLY DURING PRE OP AND PROCEDURE.   IF YOU WILL BE ADMITTED INTO THE HOSPITAL YOU ARE ALLOWED ONE SUPPORT PERSON DURING VISITATION HOURS ONLY (10AM -8PM)    The support person may change daily.  The support person must pass our screening, gel in and out, and wear a mask at all times, including in the patients room.  Patients must also wear a mask when staff or their support person are in the room.    Your procedure is scheduled on:  Tuesday, 09-23-20   Report to [facility] Main  Entrance    Report to admitting at 11:15 AM   Call this number if you have problems the morning of surgery [phone]   Do not eat food :After Midnight.   May have liquids until 10:15 AM day of surgery  CLEAR LIQUID DIET  Foods Allowed                                                                     Foods Excluded  Water, Black Coffee and tea, regular and decaf               liquids that you cannot  Plain Jell-O in any flavor  (No red)                                      see through such as: Fruit ices (not with fruit pulp)                                      milk, soups, orange juice              Iced Popsicles (No red)                                      All solid food                                   Apple juices Sports drinks like Gatorade (No red) Lightly seasoned clear broth or consume(fat free) Sugar, honey syrup  Oral Hygiene is also important to reduce your risk of infection.                                    Remember - BRUSH YOUR TEETH THE MORNING OF SURGERY WITH YOUR REGULAR TOOTHPASTE   Do NOT smoke after Midnight   Take these medicines the morning of surgery with A SIP OF WATER: Omeprazole                               You may not have any metal on your body including hair pins, jewelry, and body piercings  Do not wear make-up, lotions, powders, perfumes/cologne,  or deodorant             Do not wear nail polish.  Do not shave  48 hours prior to surgery.         Do not bring valuables to the hospital. [place].   Contacts, dentures or bridgework may not be worn into surgery.    Patients discharged the day of surgery will not be allowed to drive home.   Special Instructions: Bring a copy of your healthcare power of attorney and living will documents         the day of surgery if you haven't  scanned them in before.              Please read over the following fact sheets you were given: IF YOU HAVE QUESTIONS ABOUT YOUR PRE OP INSTRUCTIONS PLEASE CALL [phone]   [facility] - Preparing for Surgery Before surgery, you can play an important role.  Because skin is not sterile, your skin needs to be as free of germs as possible.  You can reduce the number of germs on your skin by washing with CHG (chlorahexidine gluconate) soap before surgery.  CHG is an antiseptic cleaner which kills germs and bonds with the skin to continue killing germs even after washing. Please DO NOT use if you have an allergy to CHG or antibacterial soaps.  If your skin becomes reddened/irritated stop using the CHG and inform your nurse when you arrive at Short Stay. Do not shave (including legs and underarms) for at least 48 hours prior to the first CHG shower.  You may shave your face/neck.  Please follow these instructions carefully:  1.  Shower with CHG Soap the night before surgery and the  morning of surgery.  2.  If you choose to wash your hair, wash your hair first as usual with your normal  shampoo.  3.  After you shampoo, rinse your hair and body thoroughly to remove the shampoo.                             4.  Use CHG as you would any other liquid soap.  You can apply chg directly to the skin and wash.  Gently with a scrungie or clean washcloth.  5.  Apply the CHG Soap to your body ONLY FROM THE NECK DOWN.   Do   not use on face/  open                           Wound or open sores. Avoid contact with eyes, ears mouth and   genitals (private parts).                       Wash face,  Genitals (private parts) with your normal soap.             6.  Wash thoroughly, paying special attention to the area where your    surgery  will be performed.  7.  Thoroughly rinse your body with warm water from the neck down.  8.  DO NOT shower/wash with your normal soap after using and rinsing off the CHG Soap.                9.  Pat yourself dry with a clean towel.  10.  Wear clean pajamas.            11.  Place clean sheets on your bed the night of your first shower and do not  sleep with pets. Day of Surgery : Do not apply any lotions/deodorants the morning of surgery.  Please wear clean clothes to the hospital/surgery center.  FAILURE TO FOLLOW THESE INSTRUCTIONS MAY RESULT IN THE CANCELLATION OF YOUR SURGERY  PATIENT SIGNATURE_________________________________  NURSE SIGNATURE__________________________________  ________________________________________________________________________

## 2020-09-18 NOTE — Progress Notes (Addendum)
COVID Vaccine Completed:  x3 Date COVID Vaccine completed:  March 2021 2nd dose COVID vaccine manufacturer: [employer]   PCP - Terrill Mohr Cardiologist -  Dr. Irish Lack in 2017 for chest pain, ruled out to be reflux.  No further testing required.  Chest x-ray -  EKG -  Stress Test - ECHO -  Cardiac Cath -  Pacemaker/ICD device last checked:  Sleep Study -  CPAP -   Fasting Blood Sugar -  Checks Blood Sugar _____ times a day  Blood Thinner Instructions: Aspirin Instructions: Last Dose:  Anesthesia review:   Patient denies shortness of breath, fever, cough and chest pain at PAT appointment. Patient able to climb a flight of stairs and perform ADL's without assistance.     Patient verbalized understanding of instructions that were given to them at the PAT appointment. Patient was also instructed that they will need to review over the PAT instructions again at home before surgery.

## 2020-09-19 ENCOUNTER — Other Ambulatory Visit (HOSPITAL_COMMUNITY)
Admission: RE | Admit: 2020-09-19 | Discharge: 2020-09-19 | Disposition: A | Discharge status: Home / Self Care | Payer: Medicare Other | Source: Ambulatory Visit | Attending: Urology | Admitting: Urology

## 2020-09-19 ENCOUNTER — Ambulatory Visit (HOSPITAL_COMMUNITY): Admission: RE | Admit: 2020-09-19 | Payer: Medicare Other | Source: Ambulatory Visit

## 2020-09-19 ENCOUNTER — Ambulatory Visit (HOSPITAL_COMMUNITY)
Admission: RE | Admit: 2020-09-19 | Discharge: 2020-09-19 | Disposition: A | Discharge status: Home / Self Care | Payer: Medicare Other | Source: Ambulatory Visit | Attending: Urology | Admitting: Urology

## 2020-09-19 ENCOUNTER — Encounter (HOSPITAL_COMMUNITY): Payer: Self-pay

## 2020-09-19 ENCOUNTER — Other Ambulatory Visit: Payer: Self-pay

## 2020-09-19 DIAGNOSIS — N369 Urethral disorder, unspecified: Secondary | ICD-10-CM | POA: Diagnosis not present

## 2020-09-19 DIAGNOSIS — Z20822 Contact with and (suspected) exposure to covid-19: Secondary | ICD-10-CM | POA: Insufficient documentation

## 2020-09-19 DIAGNOSIS — Z01812 Encounter for preprocedural laboratory examination: Secondary | ICD-10-CM | POA: Diagnosis not present

## 2020-09-19 DIAGNOSIS — N3030 Trigonitis without hematuria: Secondary | ICD-10-CM | POA: Diagnosis not present

## 2020-09-19 LAB — SARS CORONAVIRUS 2 (TAT 6-24 HRS): SARS Coronavirus 2: NEGATIVE

## 2020-09-19 MED ORDER — GADOBUTROL 1 MMOL/ML IV SOLN
5.0000 mL | Freq: Once | INTRAVENOUS | Status: AC | PRN
  Administered 2020-09-19: 5 mL via INTRAVENOUS

## 2020-09-22 ENCOUNTER — Other Ambulatory Visit: Payer: Self-pay

## 2020-09-22 ENCOUNTER — Encounter (HOSPITAL_COMMUNITY): Payer: Self-pay

## 2020-09-22 ENCOUNTER — Encounter (HOSPITAL_COMMUNITY)
Admission: RE | Admit: 2020-09-22 | Discharge: 2020-09-22 | Disposition: A | Discharge status: Home / Self Care | Payer: Medicare Other | Source: Ambulatory Visit | Attending: Urology | Admitting: Urology

## 2020-09-22 DIAGNOSIS — Z01812 Encounter for preprocedural laboratory examination: Secondary | ICD-10-CM | POA: Diagnosis not present

## 2020-09-22 HISTORY — DX: Other specified disorders of bone density and structure, unspecified site: M85.80

## 2020-09-22 HISTORY — DX: Rheumatoid arthritis, unspecified: M06.9

## 2020-09-22 HISTORY — DX: Ulcerative (chronic) proctitis without complications: K51.20

## 2020-09-22 LAB — CBC
HCT: 38.5 % (ref 36.0–46.0)
Hemoglobin: 13.1 g/dL (ref 12.0–15.0)
MCH: 33.4 pg (ref 26.0–34.0)
MCHC: 34 g/dL (ref 30.0–36.0)
MCV: 98.2 fL (ref 80.0–100.0)
Platelets: 237 10*3/uL (ref 150–400)
RBC: 3.92 MIL/uL (ref 3.87–5.11)
RDW: 13.2 % (ref 11.5–15.5)
WBC: 7.5 10*3/uL (ref 4.0–10.5)
nRBC: 0 % (ref 0.0–0.2)

## 2020-09-22 NOTE — Progress Notes (Signed)
Made aware to arrive at 8:30 AM 09/23/20 liquids until 7:30AM, verbalized understanding.

## 2020-09-22 NOTE — H&P (Signed)
CC/HPI: cc: urinary frequency, pelvic pain, microscopic hematuria   cc: urinary incontinence   07/10/20: 69 yo woman with urinary frequency, urinary urgency and urge incontinence 4 to 5 times a day. She wears 4-5 pads per day. She has nocturia every hour. She drinks 2 cups of coffee in the morning followed by 48 oz of water during the day and 1 mixed alcoholic drink at night. Her OBGYN give her trial of oxybutynin which did not help her symptoms at all. She does not get frequent UTIs and denies any gross hematuria. She is friends with Clemetine Marker.   OAB questionnaire: 5, 5, 5, 5, 5, 5, 5, 5=40/40   08/20/2020: Patient returns after trial of 50 mg of Myrbetriq and pelvic floor physical therapy. She is not having any improvement in symptoms at all. She is very frustrated. Patient does have 40-60 RBCs per high-powered field on urinalysis today.    UDS SUMMARY  Ms. Mikels held a max capacity of approx. 175 mls. Her 1st sensation was felt at 96 mls. No SUI was noted. There was positive low amplitude instability. She felt an increased urge but was able to inhibit her unstable contractions without leaking. She was able to generate a voluntary contraction and void. 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 Her flow was obstructed. Increased EMG activity was noted during voiding. PVR was approx. 138 mls. Trabeculation was noted. Some elevation of the bladder base was also noted. No reflux was seen. Ms. Impastato told me that her lower abdominal area has "pressure" and is "painful" at times when she is trying to void. She is anxious to return for CT and UDS results with Dr. Claudia Desanctis.   09/18/20: here for cysto    ALLERGIES: Codeine Sulfa    MEDICATIONS: Omeprazole 40 mg capsule,delayed release  Folic Acid  Methotrexate 2.5 mg tablet  Multivitamin  Restasis  Valtrex     GU PSH: Complex cystometrogram, w/ void pressure and urethral pressure profile studies, any technique - 09/08/2020 Complex Uroflow -  09/08/2020 Emg surf Electrd - 09/08/2020 Inject For cystogram - 09/08/2020 Intrabd voidng Press - 09/08/2020 Locm 300-399Mg/Ml Iodine,1Ml - 08/28/2020     NON-GU PSH: None   GU PMH: Nocturia - 09/08/2020, Discussed proceeding with urodynamic study to look for detrusor overactivity. She may be a good candidate for Botox injections pending these results., - 08/20/2020, - 08/14/2020, - 08/04/2020, - 07/30/2020, - 07/22/2020, - 07/10/2020 Urge incontinence - 09/08/2020, - 08/20/2020, - 08/14/2020, - 08/04/2020, - 07/30/2020, - 07/22/2020, - 07/10/2020 Urinary Frequency - 09/08/2020, - 08/20/2020, - 08/14/2020, - 07/30/2020, - 07/22/2020, - 07/10/2020 Urinary Urgency - 09/08/2020, - 08/20/2020, - 08/14/2020, - 07/30/2020, - 07/22/2020, - 07/10/2020 Microscopic hematuria - 08/28/2020, Discussed with patient possible sources of hematuria including kidneys, ureters, bladder and urethra. Will proceed with hematuria workup including CT urogram and cystoscopy., - 08/20/2020    NON-GU PMH: Constipation, unspecified - 08/14/2020, - 08/04/2020 Muscle weakness (generalized) - 08/14/2020, - 08/04/2020 Other muscle spasm - 08/14/2020, - 08/04/2020, - 07/30/2020, - 07/22/2020 Other specified disorders of muscle - 08/14/2020, - 08/04/2020, - 07/30/2020, - 07/22/2020    FAMILY HISTORY: None   SOCIAL HISTORY: Marital Status: Married Preferred Language: English; Ethnicity: Not Hispanic Or Latino; Race: White Current Smoking Status: Patient does not smoke anymore. Has not smoked since 07/02/2000.   Tobacco Use Assessment Completed: Used Tobacco in last 30 days? Social Drinker.  Drinks 2 caffeinated drinks per day.    REVIEW OF SYSTEMS:    GU Review Female:  Patient denies frequent urination, hard to postpone urination, burning /pain with urination, get up at night to urinate, leakage of urine, stream starts and stops, trouble starting your stream, have to strain to urinate, and being pregnant.  Gastrointestinal (Upper):   Patient denies  nausea, vomiting, and indigestion/ heartburn.  Gastrointestinal (Lower):   Patient denies diarrhea and constipation.  Constitutional:   Patient denies fever, night sweats, weight loss, and fatigue.  Skin:   Patient denies skin rash/ lesion and itching.  Eyes:   Patient denies blurred vision and double vision.  Ears/ Nose/ Throat:   Patient denies sinus problems and sore throat.  Hematologic/Lymphatic:   Patient denies swollen glands and easy bruising.  Cardiovascular:   Patient denies leg swelling and chest pains.  Respiratory:   Patient denies cough and shortness of breath.  Endocrine:   Patient denies excessive thirst.  Musculoskeletal:   Patient denies back pain and joint pain.  Neurological:   Patient denies headaches and dizziness.  Psychologic:   Patient denies depression and anxiety.   VITAL SIGNS: None   GU PHYSICAL EXAMINATION:      Notes: No gross masses seen vaginally, manual exam reveals firm anterior wall and location of urethra   MULTI-SYSTEM PHYSICAL EXAMINATION:    Constitutional: Well-nourished. No physical deformities. Normally developed. Good grooming.  Neck: Neck symmetrical, not swollen. Normal tracheal position.  Respiratory: No labored breathing, no use of accessory muscles.   Cardiovascular: Normal temperature, no swelling  Skin: No paleness, no jaundice, no cyanosis. No lesion, no ulcer, no rash.  Neurologic / Psychiatric: Oriented to time, oriented to place, oriented to person. No depression, no anxiety, no agitation.  Gastrointestinal: No rigidity, non obese abdomen.   Eyes: Normal conjunctivae. Normal eyelids.  Ears, Nose, Mouth, and Throat: Left ear no scars, no lesions, no masses. Right ear no scars, no lesions, no masses. Nose no scars, no lesions, no masses. Normal hearing. Normal lips.  Musculoskeletal: Normal gait and station of head and neck.     Complexity of Data:  Records Review:   POC Tool  Urine Test Review:   Urinalysis  Notes:                      CT Urogram  IMPRESSION:  1. 2.7 x 2.9 x 2.8 cm rim enhancing central low-density lesion in  the expected location of the urethra. While it is possible this  could be a urethral diverticulum, delayed imaging shows irregular  inferior enhancement and mass-effect on the bladder base, raising  concern for soft tissue lesion including urethral carcinoma.  Although rare, carcinoma arising within a urethral diverticulum has  been described. This should be amenable to direct inspection and  pelvic MRI without and with contrast may provide additional  characterization, as clinically warranted.  2. Subtle tiny filling defect in an interpolar calyx of the right  kidney, most likely a prominent papilla. Consider repeat hematuria  protocol CT in 3-6 months to ensure stability.  3. Aortic Atherosclerosis (ICD10-I70.0).    PROCEDURES:         Flexible Cystoscopy - 52000  Risks, benefits, and some of the potential complications of the procedure were discussed at length with the patient including infection, bleeding, voiding discomfort, urinary retention, fever, chills, sepsis, and others. All questions were answered. Informed consent was obtained. Sterile technique and intraurethral analgesia were used.  Meatus:  Patient with edematous appearing firm urethral meatus  Urethra:  Unable to traverse scope past urethral meatus  due to obstruction from mass      Seventeen French flexible cystoscope was placed in the refill meatus. At this point time it was noted that the patient had edema/mass that prevented advancing the scope further into the urethra. Secondary to pain the procedure was stopped. Instructions were given to call the office immediately for bloody urine, difficulty urinating, urinary retention, painful or frequent urination, fever, chills, nausea, vomiting or other illness. The patient stated that she understood these instructions and would comply with them.         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 Contrast - ASAP          Document Letter(s):  Created for Patient: Clinical Summary         Notes:   Physical exam an attempted cystoscopy today congruent with CT urogram which is concerning for a urethral mass. I discussed the findings of the CT scan as well as cystoscopy with the patient. I would like to obtain MRI of the pelvis to further evaluate the urethral mass as well as proceed with an exam under anesthesia with biopsy of the mass as well as possible urethral dilation and Foley catheter placement. 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 Risks and benefits of the procedure discussed with patient including bleeding, pain, need for Foley catheter, need for future intervention, and damage to surrounding structures. Patient wishes to proceed. I also reviewed patient's urodynamic study with her which to consistent with bladder outlet obstruction. I explained this is why the medications have not been working for her overactive bladder. 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   cc: Terrill Mohr, MD

## 2020-09-23 ENCOUNTER — Other Ambulatory Visit: Payer: Self-pay

## 2020-09-23 ENCOUNTER — Ambulatory Visit (HOSPITAL_COMMUNITY): Payer: Medicare Other | Admitting: Anesthesiology

## 2020-09-23 ENCOUNTER — Encounter (HOSPITAL_COMMUNITY): Payer: Self-pay | Admitting: Urology

## 2020-09-23 ENCOUNTER — Encounter (HOSPITAL_COMMUNITY)
Admission: RE | Disposition: A | Discharge status: Home / Self Care | Payer: Self-pay | Source: Home / Self Care | Attending: Urology

## 2020-09-23 ENCOUNTER — Ambulatory Visit (HOSPITAL_COMMUNITY)
Admission: RE | Admit: 2020-09-23 | Discharge: 2020-09-23 | Disposition: A | Discharge status: Home / Self Care | Payer: Medicare Other | Attending: Urology | Admitting: Urology

## 2020-09-23 DIAGNOSIS — R3129 Other microscopic hematuria: Secondary | ICD-10-CM | POA: Diagnosis not present

## 2020-09-23 DIAGNOSIS — C680 Malignant neoplasm of urethra: Secondary | ICD-10-CM | POA: Insufficient documentation

## 2020-09-23 DIAGNOSIS — Z79899 Other long term (current) drug therapy: Secondary | ICD-10-CM | POA: Insufficient documentation

## 2020-09-23 DIAGNOSIS — R339 Retention of urine, unspecified: Secondary | ICD-10-CM | POA: Diagnosis not present

## 2020-09-23 DIAGNOSIS — N3281 Overactive bladder: Secondary | ICD-10-CM | POA: Diagnosis not present

## 2020-09-23 DIAGNOSIS — Z882 Allergy status to sulfonamides status: Secondary | ICD-10-CM | POA: Diagnosis not present

## 2020-09-23 DIAGNOSIS — Z87891 Personal history of nicotine dependence: Secondary | ICD-10-CM | POA: Insufficient documentation

## 2020-09-23 DIAGNOSIS — Z885 Allergy status to narcotic agent status: Secondary | ICD-10-CM | POA: Diagnosis not present

## 2020-09-23 DIAGNOSIS — N3941 Urge incontinence: Secondary | ICD-10-CM | POA: Diagnosis not present

## 2020-09-23 HISTORY — PX: CYSTOSCOPY WITH URETHRAL DILATATION: SHX5125

## 2020-09-23 SURGERY — CYSTOSCOPY, WITH URETHRAL DILATION
Anesthesia: General

## 2020-09-23 MED ORDER — ONDANSETRON HCL 4 MG/2ML IJ SOLN: 4.0000 mg | Freq: Once | INTRAMUSCULAR | Status: DC | PRN

## 2020-09-23 MED ORDER — OXYCODONE HCL 5 MG/5ML PO SOLN: 5.0000 mg | Freq: Once | ORAL | Status: DC | PRN

## 2020-09-23 MED ORDER — LACTATED RINGERS IV SOLN: INTRAVENOUS | Status: DC

## 2020-09-23 MED ORDER — TRAMADOL HCL 50 MG PO TABS
50.0000 mg | ORAL_TABLET | Freq: Four times a day (QID) | ORAL | 0 refills | Status: DC | PRN
Start: 2020-09-23 — End: 2020-10-10

## 2020-09-23 MED ORDER — MIDAZOLAM HCL 5 MG/5ML IJ SOLN
INTRAMUSCULAR | Status: DC | PRN
  Administered 2020-09-23: 2 mg via INTRAVENOUS

## 2020-09-23 MED ORDER — FENTANYL CITRATE (PF) 100 MCG/2ML IJ SOLN
INTRAMUSCULAR | Status: AC
  Administered 2020-09-23: 25 ug via INTRAVENOUS
  Filled 2020-09-23: qty 2

## 2020-09-23 MED ORDER — EPHEDRINE SULFATE-NACL 50-0.9 MG/10ML-% IV SOSY
PREFILLED_SYRINGE | INTRAVENOUS | Status: DC | PRN
  Administered 2020-09-23: 5 mg via INTRAVENOUS

## 2020-09-23 MED ORDER — OXYCODONE HCL 5 MG PO TABS: 5.0000 mg | ORAL_TABLET | Freq: Once | ORAL | Status: DC | PRN

## 2020-09-23 MED ORDER — ORAL CARE MOUTH RINSE: 15.0000 mL | Freq: Once | OROMUCOSAL | Status: AC

## 2020-09-23 MED ORDER — LIDOCAINE 2% (20 MG/ML) 5 ML SYRINGE
INTRAMUSCULAR | Status: DC | PRN
  Administered 2020-09-23: 100 mg via INTRAVENOUS

## 2020-09-23 MED ORDER — ONDANSETRON HCL 4 MG/2ML IJ SOLN
INTRAMUSCULAR | Status: DC | PRN
  Administered 2020-09-23: 4 mg via INTRAVENOUS

## 2020-09-23 MED ORDER — MIDAZOLAM HCL 2 MG/2ML IJ SOLN
INTRAMUSCULAR | Status: AC
  Filled 2020-09-23: qty 2

## 2020-09-23 MED ORDER — TRAMADOL HCL 50 MG PO TABS
ORAL_TABLET | ORAL | Status: AC
  Filled 2020-09-23: qty 1

## 2020-09-23 MED ORDER — FENTANYL CITRATE (PF) 100 MCG/2ML IJ SOLN
INTRAMUSCULAR | Status: AC
  Filled 2020-09-23: qty 2

## 2020-09-23 MED ORDER — MEPERIDINE HCL 50 MG/ML IJ SOLN: 6.2500 mg | INTRAMUSCULAR | Status: DC | PRN

## 2020-09-23 MED ORDER — EPHEDRINE 5 MG/ML INJ
INTRAVENOUS | Status: AC
  Filled 2020-09-23: qty 10

## 2020-09-23 MED ORDER — ACETAMINOPHEN 325 MG PO TABS: 325.0000 mg | ORAL_TABLET | ORAL | Status: DC | PRN

## 2020-09-23 MED ORDER — TRAMADOL HCL 50 MG PO TABS: 50.0000 mg | ORAL_TABLET | Freq: Once | ORAL | Status: DC | PRN

## 2020-09-23 MED ORDER — LIDOCAINE 2% (20 MG/ML) 5 ML SYRINGE
INTRAMUSCULAR | Status: AC
  Filled 2020-09-23: qty 5

## 2020-09-23 MED ORDER — PROPOFOL 10 MG/ML IV BOLUS
INTRAVENOUS | Status: AC
  Filled 2020-09-23: qty 20

## 2020-09-23 MED ORDER — PROPOFOL 10 MG/ML IV BOLUS
INTRAVENOUS | Status: DC | PRN
  Administered 2020-09-23: 140 mg via INTRAVENOUS

## 2020-09-23 MED ORDER — SODIUM CHLORIDE 0.9 % IV SOLN
2.0000 g | Freq: Once | INTRAVENOUS | Status: AC
  Administered 2020-09-23: 2 g via INTRAVENOUS
  Filled 2020-09-23: qty 20

## 2020-09-23 MED ORDER — FENTANYL CITRATE (PF) 100 MCG/2ML IJ SOLN
INTRAMUSCULAR | Status: DC | PRN
  Administered 2020-09-23 (×4): 25 ug via INTRAVENOUS
  Administered 2020-09-23: 50 ug via INTRAVENOUS

## 2020-09-23 MED ORDER — SODIUM CHLORIDE 0.9 % IR SOLN
Status: DC | PRN
  Administered 2020-09-23 (×3): 3000 mL

## 2020-09-23 MED ORDER — HYDROCODONE-ACETAMINOPHEN 5-325 MG PO TABS: 1.0000 | ORAL_TABLET | ORAL | 0 refills | Status: DC | PRN

## 2020-09-23 MED ORDER — DEXAMETHASONE SODIUM PHOSPHATE 10 MG/ML IJ SOLN
INTRAMUSCULAR | Status: DC | PRN
  Administered 2020-09-23: 8 mg via INTRAVENOUS

## 2020-09-23 MED ORDER — ONDANSETRON HCL 4 MG/2ML IJ SOLN
INTRAMUSCULAR | Status: AC
  Filled 2020-09-23: qty 2

## 2020-09-23 MED ORDER — FENTANYL CITRATE (PF) 100 MCG/2ML IJ SOLN: 25.0000 ug | INTRAMUSCULAR | Status: DC | PRN

## 2020-09-23 MED ORDER — ACETAMINOPHEN 160 MG/5ML PO SOLN: 325.0000 mg | ORAL | Status: DC | PRN

## 2020-09-23 MED ORDER — DEXAMETHASONE SODIUM PHOSPHATE 10 MG/ML IJ SOLN
INTRAMUSCULAR | Status: AC
  Filled 2020-09-23: qty 1

## 2020-09-23 MED ORDER — CHLORHEXIDINE GLUCONATE 0.12 % MT SOLN
15.0000 mL | Freq: Once | OROMUCOSAL | Status: AC
  Administered 2020-09-23: 15 mL via OROMUCOSAL

## 2020-09-23 SURGICAL SUPPLY — 28 items
BAG URINE DRAIN 2000ML AR STRL (UROLOGICAL SUPPLIES) ×2 IMPLANT
BALLN NEPHROSTOMY (BALLOONS)
BALLOON NEPHROSTOMY (BALLOONS) IMPLANT
CATH FOLEY 2W COUNCIL 20FR 5CC (CATHETERS) IMPLANT
CATH FOLEY 2WAY SLVR  5CC 18FR (CATHETERS) ×2
CATH FOLEY 2WAY SLVR 5CC 18FR (CATHETERS) ×1 IMPLANT
CATH INTERMIT  6FR 70CM (CATHETERS) IMPLANT
CATH ROBINSON RED A/P 14FR (CATHETERS) IMPLANT
CATH URET 5FR 28IN CONE TIP (BALLOONS)
CATH URET 5FR 70CM CONE TIP (BALLOONS) IMPLANT
CLOTH BEACON ORANGE TIMEOUT ST (SAFETY) ×2 IMPLANT
CNTNR URN SCR LID CUP LEK RST (MISCELLANEOUS) ×1 IMPLANT
CONT SPEC 4OZ STRL OR WHT (MISCELLANEOUS) ×2
DRSG TELFA 3X8 NADH (GAUZE/BANDAGES/DRESSINGS) ×2 IMPLANT
GLOVE BIO SURGEON STRL SZ7.5 (GLOVE) ×2 IMPLANT
GOWN STRL REUS W/TWL LRG LVL3 (GOWN DISPOSABLE) ×2 IMPLANT
GUIDEWIRE ANG ZIPWIRE 038X150 (WIRE) IMPLANT
GUIDEWIRE STR DUAL SENSOR (WIRE) ×2 IMPLANT
INSTR BIOPSY MAXCORE 18GX20 (NEEDLE) ×2 IMPLANT
KIT TURNOVER KIT A (KITS) IMPLANT
LOOP CUT BIPOLAR 24F LRG (ELECTROSURGICAL) ×2 IMPLANT
MANIFOLD NEPTUNE II (INSTRUMENTS) IMPLANT
NS IRRIG 1000ML POUR BTL (IV SOLUTION) IMPLANT
PACK CYSTO (CUSTOM PROCEDURE TRAY) ×2 IMPLANT
PENCIL SMOKE EVACUATOR (MISCELLANEOUS) IMPLANT
SYR TOOMEY IRRIG 70ML (MISCELLANEOUS) ×2
SYRINGE TOOMEY IRRIG 70ML (MISCELLANEOUS) ×1 IMPLANT
WATER STERILE IRR 3000ML UROMA (IV SOLUTION) IMPLANT

## 2020-09-23 NOTE — Discharge Instructions (Signed)
Post Bladder Surgery Instructions   General instructions:     Your recent bladder surgery requires very little post hospital care but some definite precautions.  Despite the fact that no skin incisions were used, the area around the bladder incisions are raw and covered with scabs to promote healing and prevent bleeding. Certain precautions are needed to insure that the scabs are not disturbed over the next 2-4 weeks while the healing proceeds.  Because the raw surface inside your bladder and the irritating effects of urine you may expect frequency of urination and/or urgency (a stronger desire to urinate) and perhaps even getting up at night more often. This will usually resolve or improve slowly over the healing period. You may see some blood in your urine over the first 6 weeks. Do not be alarmed, even if the urine was clear for a while. Get off your feet and drink lots of fluids until clearing occurs. If you start to pass clots or don't improve call us.  Catheter: (If you are discharged with a catheter.)  1. Keep your catheter secured to your leg at all times with tape or the supplied strap. 2. You may experience leakage of urine around your catheter- as long as the  catheter continues to drain, this is normal.  If your catheter stops draining  go to the ER. 3. You may also have blood in your urine, even after it has been clear for  several days; you may even pass some small blood clots or other material.  This  is normal as well.  If this happens, sit down and drink plenty of water to help  make urine to flush out your bladder.  If the blood in your urine becomes worse  after doing this, contact our office or return to the ER. 4. You may use the leg bag (small bag) during the day, but use the large bag at  night.  Diet:  You may return to your normal diet immediately. Because of the raw surface of your bladder, alcohol, spicy foods, foods high in acid and drinks with caffeine may  cause irritation or frequency and should be used in moderation. To keep your urine flowing freely and avoid constipation, drink plenty of fluids during the day (8-10 glasses). Tip: Avoid cranberry juice because it is very acidic.  Activity:  Your physical activity doesn't need to be restricted. However, if you are very active, you may see some blood in the urine. We suggest that you reduce your activity under the circumstances until the bleeding has stopped.  Bowels:  It is important to keep your bowels regular during the postoperative period. Straining with bowel movements can cause bleeding. A bowel movement every other day is reasonable. Use a mild laxative if needed, such as milk of magnesia 2-3 tablespoons, or 2 Dulcolax tablets. Call if you continue to have problems. If you had been taking narcotics for pain, before, during or after your surgery, you may be constipated. Take a laxative if necessary.    Medication:  You should resume your pre-surgery medications unless told not to. In addition you may be given an antibiotic to prevent or treat infection. Antibiotics are not always necessary. All medication should be taken as prescribed until the bottles are finished unless you are having an unusual reaction to one of the drugs.

## 2020-09-23 NOTE — Transfer of Care (Signed)
Immediate Anesthesia Transfer of Care Note  Patient: Sandra Mann  Procedure(s) Performed: CYSTOSCOPY WITH URETHRAL DILATATION, EXAM UNDER ANESTHESIA, BIOPSY OF URETHRAL MASS (N/A )  Patient Location: PACU  Anesthesia Type:General  Level of Consciousness: awake, alert , oriented and patient cooperative  Airway & Oxygen Therapy: Patient Spontanous Breathing and Patient connected to face mask oxygen  Post-op Assessment: Report given to RN, Post -op Vital signs reviewed and stable and Patient moving all extremities  Post vital signs: Reviewed and stable  Last Vitals:  Vitals Value Taken Time  BP    Temp    Pulse 70 09/23/20 1129  Resp 11 09/23/20 1129  SpO2 100 % 09/23/20 1129  Vitals shown include unvalidated device data.  Last Pain:  Vitals:   09/23/20 0908  TempSrc: Oral  PainSc:       Patients Stated Pain Goal: 4 (81/85/90 9311)  Complications: No complications documented.

## 2020-09-23 NOTE — Anesthesia Preprocedure Evaluation (Addendum)
Anesthesia Evaluation  Patient identified by MRN, date of birth, ID band Patient awake    Reviewed: Allergy & Precautions, NPO status , Patient's Chart, lab work & pertinent test results  Airway Mallampati: II  TM Distance: >3 FB Neck ROM: Limited    Dental  (+) Teeth Intact, Missing, Dental Advisory Given,    Pulmonary former smoker,  Left neck mandible abscess   breath sounds clear to auscultation       Cardiovascular negative cardio ROS   Rhythm:Regular Rate:Normal     Neuro/Psych negative neurological ROS     GI/Hepatic Neg liver ROS, PUD, GERD  ,  Endo/Other  negative endocrine ROS  Renal/GU negative Renal ROS     Musculoskeletal  (+) Arthritis ,   Abdominal   Peds  Hematology negative hematology ROS (+)   Anesthesia Other Findings   Reproductive/Obstetrics                            Anesthesia Physical  Anesthesia Plan  ASA: III  Anesthesia Plan: General   Post-op Pain Management:    Induction: Intravenous  PONV Risk Score and Plan: 3 and Ondansetron, Dexamethasone and Treatment may vary due to age or medical condition  Airway Management Planned: LMA and Oral ETT  Additional Equipment:   Intra-op Plan:   Post-operative Plan: Extubation in OR  Informed Consent: I have reviewed the patients History and Physical, chart, labs and discussed the procedure including the risks, benefits and alternatives for the proposed anesthesia with the patient or authorized representative who has indicated his/her understanding and acceptance.     Dental advisory given  Plan Discussed with: CRNA and Anesthesiologist  Anesthesia Plan Comments:        Anesthesia Quick Evaluation

## 2020-09-23 NOTE — Interval H&P Note (Signed)
History and Physical Interval Note: Patient also consented for suprapubic tube placement if unable to place Foley from below. 09/23/2020 9:40 AM  Sandra Mann  has presented today for surgery, with the diagnosis of URETHRAL MASS.  The various methods of treatment have been discussed with the patient and family. After consideration of risks, benefits and other options for treatment, the patient has consented to  Procedure(s): CYSTOSCOPY WITH URETHRAL DILATATION, EXAM UNDER ANESTHESIA, BIOPSY OF URETHRAL MASS (N/A) as a surgical intervention.  The patient's history has been reviewed, patient examined, no change in status, stable for surgery.  I have reviewed the patient's chart and labs.  Questions were answered to the patient's satisfaction.     Kashawna Manzer D Melva Faux

## 2020-09-23 NOTE — Anesthesia Procedure Notes (Signed)
Procedure Name: LMA Insertion Date/Time: 09/23/2020 10:25 AM Performed by: Victoriano Lain, CRNA Pre-anesthesia Checklist: Patient identified, Emergency Drugs available, Suction available, Patient being monitored and Timeout performed Patient Re-evaluated:Patient Re-evaluated prior to induction Oxygen Delivery Method: Circle system utilized Preoxygenation: Pre-oxygenation with 100% oxygen Induction Type: IV induction LMA: LMA with gastric port inserted LMA Size: 4.0 Number of attempts: 1 Placement Confirmation: positive ETCO2 and breath sounds checked- equal and bilateral Tube secured with: Tape Dental Injury: Teeth and Oropharynx as per pre-operative assessment

## 2020-09-23 NOTE — Op Note (Signed)
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 Foley)   SPECIMEN:   1. Urethral meatus biopsy 2. Proximal urethral biopsy  FINDINGS: 1. Urethral meatus appears friable and enlarge 2. Urethral firm and bladder neck fixed 3. Bilateral UOs not seen, appear involved in urethral tumoe 4. Vaginal exam without evidence of mass, bimanual exam reveals firm anterior vaginal wall corresponding to bladder neck/proximal urethra  DISPOSITION OF SPECIMEN:  PATHOLOGY  Indication: 69 year old woman initially referred for overactive bladder found to have microscopic hematuria.  CT urogram done for microscopic work-up showed concern for urethral mass.  Cystoscopy in the office showed urethral mass and obstruction of urethra with inability to pass scope.  Description of operation: The patient was taken to the operating room and administered general anesthesia. They were then placed on the table and moved to the dorsal lithotomy position after which the genitalia was sterilely prepped and draped. An official timeout was then performed.  Examination of the urethral meatus revealed distortion of tissue consistent with urethral mass.  Prostate biopsy gun was then used to obtain biopsy from the urethral meatus focusing on the right side based on MRI findings.    Next a wire was then placed in the urethral meatus and advanced into the bladder.  Dilation then took place from 54 [place] to 26 [place].  A 21 French rigid cystoscope was then placed in the urethral meatus and advanced into the bladder and direct visualization.  Multiple pictures were taken.  A papillary mass was seen at the bladder neck distorting the trigone.  The cystoscope was removed.  The  37 French resectoscope with the 30 lens and visual obturator were then passed into the bladder under direct visualization. Urethra appeared normal. The visual obturator was then removed and the Gyrus resectoscope element with 30  lens was then inserted and the bladder was fully and systematically inspected.   Ureteral orifices were not seen and appeared to be distorted by the mass.  Proximal urethral mass biopsies were then taken with the resectoscope and bipolar loop.  Cystoscopy the rest of the bladder did not reveal any other mass.  All portions of the urethral mass biopsy were removed in the bladder.  Hemostasis was deemed adequate with the irrigant off.  An 37 French Foley catheter was then inserted in the bladder and irrigated. The irrigant returned slightly pink with no clots. The patient was awakened and taken to the recovery room.  The patient will continue Foley catheter for 1 week.  PLAN OF CARE: Discharge to home after PACU  PATIENT DISPOSITION:  PACU - hemodynamically stable.

## 2020-09-24 ENCOUNTER — Encounter (HOSPITAL_COMMUNITY): Payer: Self-pay | Admitting: Urology

## 2020-09-24 NOTE — Anesthesia Postprocedure Evaluation (Signed)
Anesthesia Post Note  Patient: Sandra Mann  Procedure(s) Performed: CYSTOSCOPY WITH URETHRAL DILATATION, EXAM UNDER ANESTHESIA, BIOPSY OF URETHRAL MASS (N/A )     Patient location during evaluation: PACU Anesthesia Type: General Level of consciousness: awake and alert Pain management: pain level controlled Vital Signs Assessment: post-procedure vital signs reviewed and stable Respiratory status: spontaneous breathing, nonlabored ventilation, respiratory function stable and patient connected to nasal cannula oxygen Cardiovascular status: blood pressure returned to baseline and stable Postop Assessment: no apparent nausea or vomiting Anesthetic complications: no   No complications documented.  Last Vitals:  Vitals:   09/23/20 1230 09/23/20 1245  BP: 131/68 124/67  Pulse: (!) 59 64  Resp: 19 20  Temp:    SpO2: 100% 100%    Last Pain:  Vitals:   09/23/20 1230  TempSrc:   PainSc: 3                  Yazleemar Strassner

## 2020-09-26 LAB — SURGICAL PATHOLOGY

## 2020-10-06 ENCOUNTER — Telehealth: Payer: Self-pay | Admitting: Oncology

## 2020-10-06 ENCOUNTER — Encounter: Payer: Self-pay | Admitting: Oncology

## 2020-10-06 NOTE — Telephone Encounter (Signed)
Received an urgent referral from [facility] for bladder cancer. Sandra Mann has been cld and scheduled to see Dr. Alen Blew on 12/10 at 2pm.

## 2020-10-09 ENCOUNTER — Ambulatory Visit: Payer: Medicare Other | Admitting: Oncology

## 2020-10-10 ENCOUNTER — Encounter: Payer: Self-pay | Admitting: Oncology

## 2020-10-10 ENCOUNTER — Other Ambulatory Visit: Payer: Self-pay

## 2020-10-10 ENCOUNTER — Inpatient Hospital Stay: Payer: Medicare Other | Attending: Oncology | Admitting: Oncology

## 2020-10-10 VITALS — BP 135/66 | HR 85 | Temp 97.8°F | Resp 18 | Ht 63.0 in | Wt 115.5 lb

## 2020-10-10 DIAGNOSIS — Z79899 Other long term (current) drug therapy: Secondary | ICD-10-CM | POA: Insufficient documentation

## 2020-10-10 DIAGNOSIS — Z5111 Encounter for antineoplastic chemotherapy: Secondary | ICD-10-CM | POA: Insufficient documentation

## 2020-10-10 DIAGNOSIS — C680 Malignant neoplasm of urethra: Secondary | ICD-10-CM | POA: Diagnosis not present

## 2020-10-10 DIAGNOSIS — K219 Gastro-esophageal reflux disease without esophagitis: Secondary | ICD-10-CM | POA: Insufficient documentation

## 2020-10-10 DIAGNOSIS — M069 Rheumatoid arthritis, unspecified: Secondary | ICD-10-CM | POA: Insufficient documentation

## 2020-10-10 DIAGNOSIS — Z87891 Personal history of nicotine dependence: Secondary | ICD-10-CM | POA: Insufficient documentation

## 2020-10-10 DIAGNOSIS — Z8249 Family history of ischemic heart disease and other diseases of the circulatory system: Secondary | ICD-10-CM | POA: Insufficient documentation

## 2020-10-10 DIAGNOSIS — Z833 Family history of diabetes mellitus: Secondary | ICD-10-CM | POA: Insufficient documentation

## 2020-10-10 DIAGNOSIS — M8580 Other specified disorders of bone density and structure, unspecified site: Secondary | ICD-10-CM | POA: Insufficient documentation

## 2020-10-10 MED ORDER — PROCHLORPERAZINE MALEATE 10 MG PO TABS: 10.0000 mg | ORAL_TABLET | Freq: Four times a day (QID) | ORAL | 0 refills | Status: DC | PRN

## 2020-10-10 MED ORDER — LIDOCAINE-PRILOCAINE 2.5-2.5 % EX CREA: 1.0000 "application " | TOPICAL_CREAM | CUTANEOUS | 0 refills | Status: DC | PRN

## 2020-10-10 NOTE — Progress Notes (Signed)
Reason for the request:    Urethral cancer   HPI: I was asked by Dr. Claudia Desanctis to evaluate [name] for urethral carcinoma.  She is a 69 year old woman who started developing symptoms of urinary frequency, incontinence and dysuria.  She was evaluated by Dr. Claudia Desanctis and found to have 40-60 RBCs per high-power field in her urine analysis.  Based on these findings she underwent CT urogram which showed a 2.7 x 2.9 x 2.8 cm enhancing central low-density lesion in the urethra without any evidence of any adenopathy.  Flexible cystoscopy showed edema/mass preventing the scope from further into the urethra.  She subsequently underwent an MRI of the pelvis on September 19, 2020.  The MRI showed a masslike area involving the entire urethra associated with ulceration and involvement of the anterior vagina extending to the pelvic floor.  She subsequently underwent examination under anesthesia with cystoscopy urethral dilation and a biopsy completed on September 23, 2020.  Catheter was left in place at that time.  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  Based on these findings referred to me for evaluation.  Clinically, she feels reasonably well without any major complaints.  She is urinating with the help of a catheter without any discomfort.  She does report some pelvic pain at times.  He remains active and attends activities of daily living.    She does not report any headaches, blurry vision, syncope or seizures. Does not report any fevers, chills or sweats.  Does not report any cough, wheezing or hemoptysis.  Does not report any chest pain, palpitation, orthopnea or leg edema.  Does not report any nausea, vomiting or abdominal pain.  Does not report any constipation or diarrhea.  Does not report any  skeletal complaints.    Does not report frequency, urgency or hematuria.  Does not report any skin rashes or lesions. Does not report any heat or cold intolerance.  Does not report any lymphadenopathy or petechiae.  Does not report any anxiety or depression.  Remaining review of systems is negative.    Past Medical History:  Diagnosis Date  . Chest pain   . GERD (gastroesophageal reflux disease)   . 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 Oral mass    left  . Osteopenia   . PONV (postoperative nausea and vomiting) 1988   "after tubes tied"  . Rheumatoid arteritis ([place])   . Rheumatoid arthritis ([place])   . Ulcerative proctitis ([place])   . Wears glasses   :  Past Surgical History:  Procedure Laterality Date  . COLONOSCOPY    . CYSTOSCOPY WITH URETHRAL DILATATION N/A 09/23/2020   Procedure: CYSTOSCOPY WITH URETHRAL DILATATION, EXAM UNDER ANESTHESIA, BIOPSY OF URETHRAL MASS;  Surgeon: Robley Fries, MD;  Location: [facility];  Service: Urology;  Laterality: N/A;  . EXCISION ORAL TUMOR Left 03/27/2014   Procedure: EXCISION OF ORAL CANCER;  Surgeon: Izora Gala, MD;  Location: [place];  Service: ENT;  Laterality: Left;  . FINGER GANGLION CYST EXCISION Right 2007   small dip  . INCISION AND DRAINAGE ABSCESS Left 06/12/2014   Procedure: LEFT INCISION AND DRAINAGE MANDIBULAR ABSCESS;  Surgeon: Izora Gala, MD;  Location: [place];  Service: ENT;  Laterality: Left;  . INCISION AND DRAINAGE ABSCESS Left 11/22/2014   Procedure:  INCISION AND DRAINAGE LEFT  MANDIBULAR ABSCESS;  Surgeon: Izora Gala, MD;  Location: [place];  Service: ENT;  Laterality: Left;  . INCISION AND DRAINAGE OF PERITONSILLAR ABCESS Left 06/01/2014   Procedure: INCISION AND DRAINAGE Masticator Space Infection;  Surgeon: Izora Gala, MD;  Location: [place];  Service: ENT;  Laterality: Left;  Marland Kitchen MULTIPLE EXTRACTIONS WITH ALVEOLOPLASTY Left 03/27/2014   Procedure: EXTRACTIONS OF NECESSARY TOOTH ;  Surgeon:  Ceasar Mons, DDS;  Location: [place];  Service: Oral Surgery;  Laterality: Left;  . TUBAL LIGATION  ~ 1988  :   Current Outpatient Medications:  .  cycloSPORINE (RESTASIS) 0.05 % ophthalmic emulsion, Place 1 drop into both eyes 2 (two) times daily., Disp: , Rfl:  .  estradiol (ESTRING) 2 MG vaginal ring, Place 2 mg vaginally every 3 (three) months. follow package directions (Patient not taking: Reported on 09/18/2020), Disp: , Rfl:  .  Estradiol 10 MCG TABS vaginal tablet, Place 10 mcg vaginally daily., Disp: , Rfl:  .  folic acid (FOLVITE) 510 MCG tablet, Take 1,600 mcg by mouth daily. , Disp: , Rfl:  .  HYDROcodone-acetaminophen (NORCO) 5-325 MG tablet, Take 1 tablet by mouth every 4 (four) hours as needed for moderate pain., Disp: 6 tablet, Rfl: 0 .  mesalamine (CANASA) 1000 MG suppository, Place 1,000 mg rectally every other day. , Disp: , Rfl:  .  methotrexate (RHEUMATREX) 2.5 MG tablet, Take 20 mg by mouth once a week. , Disp: , Rfl:  .  Multiple Vitamin (MULTIVITAMIN WITH MINERALS) TABS tablet, Take 1 tablet by mouth daily., Disp: , Rfl:  .  omeprazole (PRILOSEC) 40 MG capsule, Take 40 mg by mouth in the morning. , Disp: , Rfl:  .  traMADol (ULTRAM) 50 MG tablet, Take 1 tablet (50 mg total) by mouth every 6 (six) hours as needed for severe pain., Disp: 20 tablet, Rfl: 0:  Allergies  Allergen Reactions  . Codeine Nausea And Vomiting  . Sulfa Antibiotics Nausea And Vomiting  . Vicodin [Hydrocodone-Acetaminophen] Nausea And Vomiting  :  Family History  Problem Relation Age of Onset  . Heart attack Mother   . Diabetes Maternal Grandmother   :  Social History   Socioeconomic History  . Marital status: Married    Spouse name: Not on file  . Number of children: Not on file  . Years of education: Not on file  . Highest education level: Not on file  Occupational History  . Not on file  Tobacco Use  . Smoking status: Former Smoker    Packs/day: 1.00     Years: 30.00    Pack years: 30.00    Types: Cigarettes    Quit date: 03/20/2000    Years since quitting: 20.5  . Smokeless tobacco: Never Used  Vaping Use  . Vaping Use: Never used  Substance and Sexual Activity  . Alcohol use: Yes    Comment: 05/28/2014 "drink a glass of wine ~ q night; none for the last week"  . Drug use: No  . Sexual activity: Not Currently    Birth control/protection: Post-menopausal  Other Topics Concern  . Not on file  Social History Narrative  . Not on file   Social Determinants of Health   Financial Resource Strain: Not on file  Food Insecurity: Not on file  Transportation Needs: Not on file  Physical Activity: Not on file  Stress: Not on file  Social Connections: Not on file  Intimate Partner Violence: Not on  file  :  Pertinent items are noted in HPI.  Exam: Blood pressure 135/66, pulse 85, temperature 97.8 F (36.6 C), temperature source Tympanic, resp. rate 18, height 5' 3"  (1.6 m), weight 115 lb 8 oz (52.4 kg), SpO2 100 %.   ECOG  1  General appearance: alert and cooperative appeared without distress. Head: atraumatic without any abnormalities. Eyes: conjunctivae/corneas clear. PERRL.  Sclera anicteric. Throat: lips, mucosa, and tongue normal; without oral thrush or ulcers. Resp: clear to auscultation bilaterally without rhonchi, wheezes or dullness to percussion. Cardio: regular rate and rhythm, S1, S2 normal, no murmur, click, rub or gallop GI: soft, non-tender; bowel sounds normal; no masses,  no organomegaly Skin: Skin color, texture, turgor normal. No rashes or lesions Lymph nodes: Cervical, supraclavicular, and axillary nodes normal. Neurologic: Grossly normal without any motor, sensory or deep tendon reflexes. Musculoskeletal: No joint deformity or effusion.    MR PELVIS W WO CONTRAST  Result Date: 09/22/2020 CLINICAL DATA:  Urethral mass versus is diverticulum EXAM: MRI PELVIS WITHOUT AND WITH CONTRAST TECHNIQUE: Multiplanar  multisequence MR imaging of the pelvis was performed both before and after administration of intravenous contrast. CONTRAST:  31m GADAVIST GADOBUTROL 1 MMOL/ML IV SOLN COMPARISON:  CT evaluation from August 28, 2020 FINDINGS: Urinary Tract: Mild distension of the urinary bladder with irregular appearance of the bladder base with abnormal signal that is contiguous with the urethra and an ulcerated appearing area that is contiguous with the urethra and bladder base and invades the anterior vagina, inseparable from the anterior wall of the vagina, best seen on image 11 of series 5. Mass in total measuring approximately 4.0 x 3.0 x 2.8 cm extending from the bladder base through the distal urethra with disruption of normal urethral anatomy, I the the normal signal of muscular layer of the urethra as it passes towards the pelvic floor. On image 13 of series 4 the mass again shows more posterior than anterior extension and is inseparable from the anterior wall of the vagina with ulceration seen in the posterior aspect of the mass best demonstrated on image 11 of series 5, contiguous with the bladder base and trigone vaginal involvement begins just below the level of the anterior cervical lip and tracks along approximately 2.5 of the vagina. Tumor begins in the trigone but does not obstruct the ureters at this time. Just below the bladder base there is frank extension beyond the confines of the urethra into the periurethral fat, measuring approximately 11 mm best seen on image 22 of series 3 along the RIGHT lateral aspect of the urethra, contiguous with soft tissue tracking posteriorly and involving the vagina. At this level abutment of pelvic floor musculature can be seen also on the RIGHT. Stromal disruption is not as pronounced passing inferiorly to the meatus but loss of the low signal outer layer of the urethra is evident particularly along the posterior and LEFT lateral aspects more inferiorly near the urethral  meatus. Irregular enhancement of this area is noted postcontrast administration. Bowel: Visualized bowel without acute process on limited assessment. Vascular/Lymphatic: Vascular structures grossly patent not well evaluated. No adenopathy in the pelvis. Reproductive: Involvement of the vagina. The upper vagina may show frank invasion, consider direct visualization in this area to evaluate the anterior wall of vagina. Other:  No ascites. Musculoskeletal: No suspicious bone lesions identified. IMPRESSION: 1. Masslike area involving nearly the entire urethra, associated with ulceration and involvement of the anterior vagina, extending to abut pelvic floor and into distal urethra with  variable degrees of disruption of normal urethral anatomy. Findings are highly suspicious for urethral neoplasm with bladder base and vaginal involvement. Based on posterior orientation it is possible a tumor could have arisen in a urethral diverticulum. Direct visualization of the vagina may be helpful. Tissue sampling is suggested. 2. Superimposed inflammation could confound findings above but again findings remain most suggestive of a tumor involving the urethra with local extension. 3. Suspected tumor begins in the trigone but does not obstruct the ureters at this time. Electronically Signed   By: Zetta Bills M.D.   On: 09/22/2020 08:47    Assessment and Plan:    69 year old woman with:  1.  Urethral malignancy diagnosed in November 2021.  She was found to have 4.0 x 3.0 x 2.8 cm tumor involving the entire urethra extending from the bladder base and possibly involving the anterior vaginal wall.  The biopsy showed high-grade urothelial carcinoma with squamous component.  Clinical stage likely T4N0 disease.   The natural course of this disease was reviewed and treatment options were discussed.  Radical surgery would be recommended at this point to offer the best curative option moving forward.  The role of neoadjuvant  chemotherapy or chemotherapy with radiation was discussed and the role of adjuvant chemotherapy as an option was also reviewed.  Given the large tumor and the predominant transitional cell carcinoma component, I recommended proceeding with neoadjuvant chemotherapy utilizing gemcitabine and cisplatin.  Proceeding with surgery upfront would be reasonable if her symptoms are intolerable and wants to have done surgery sooner than later.  The logistics and rationale for using chemotherapy was reviewed today in detail.  Complication associated with cisplatin and gemcitabine chemotherapy was discussed.  These complications include nausea, vomiting, myelosuppression, fatigue, infusion related complications, renal insufficiency, neutropenia, neutropenic sepsis and rarely serious thrombosis, hospitalization and death.  The benefit would also if he has an excellent response to chemotherapy, curative surgical resection may be attempted.  The plan is to treat with gemcitabine and cisplatin on day 1, gemcitabine day 8 out of a 21-day cycle.  Anticipate needing 4 cycles of therapy    After discussion today, she is agreeable to proceed after chemo education class.     2.  IV access: Risks and benefits of using Port-A-Cath versus peripheral veins was discussed today.  Complication associated with Port-A-Cath insertion include bleeding, infection and thrombosis.  After discussing the risks and benefits, she is agreeable to proceed.   3.  Antiemetics: Prescription for Compazine was made available to her.    4.  Renal function surveillance:  Baseline kidney function is normal and will be monitored on platinum based therapy.   5.  Goals of care:  Therapy is curative and aggressive measures are warranted.   6.  Follow-up: will be in the immediate future to start chemotherapy.  60  minutes were dedicated to this visit. The time was spent on reviewing laboratory data, imaging studies, discussing treatment options, and  answering questions regarding future plan.    A copy of this consult has been forwarded to the requesting physician.

## 2020-10-10 NOTE — Progress Notes (Signed)
START ON PATHWAY REGIMEN - Bladder     A cycle is every 21 days:     Gemcitabine      Cisplatin   **Always confirm dose/schedule in your pharmacy ordering system**  Patient Characteristics: Pre-Cystectomy or Nonsurgical Candidate (Clinical Staging), cT4b, Any cN, M0, Good Renal Function (CrCl ? 50 mL/min) Therapeutic Status: Pre-Cystectomy or Nonsurgical Candidate (Clinical Staging) AJCC M Category: cM0 AJCC 8 Stage Grouping: Unknown AJCC T Category: cT4 AJCC N Category: cN0 Renal Function: Good Renal Function (CrCl ? 50 mL/min) Intent of Therapy: Curative Intent, Discussed with Patient

## 2020-10-17 ENCOUNTER — Telehealth: Payer: Self-pay | Admitting: Oncology

## 2020-10-17 NOTE — Telephone Encounter (Signed)
Scheduled per 12/10 los, patient has been called and notified.

## 2020-10-19 ENCOUNTER — Other Ambulatory Visit: Payer: Self-pay | Admitting: Radiology

## 2020-10-21 ENCOUNTER — Other Ambulatory Visit: Payer: Self-pay

## 2020-10-21 ENCOUNTER — Other Ambulatory Visit: Payer: Self-pay | Admitting: Oncology

## 2020-10-21 ENCOUNTER — Ambulatory Visit (HOSPITAL_COMMUNITY)
Admission: RE | Admit: 2020-10-21 | Discharge: 2020-10-21 | Disposition: A | Discharge status: Home / Self Care | Payer: Medicare Other | Source: Ambulatory Visit | Attending: Oncology | Admitting: Oncology

## 2020-10-21 ENCOUNTER — Encounter (HOSPITAL_COMMUNITY): Payer: Self-pay

## 2020-10-21 ENCOUNTER — Inpatient Hospital Stay: Payer: Medicare Other

## 2020-10-21 DIAGNOSIS — Z885 Allergy status to narcotic agent status: Secondary | ICD-10-CM | POA: Diagnosis not present

## 2020-10-21 DIAGNOSIS — C680 Malignant neoplasm of urethra: Secondary | ICD-10-CM

## 2020-10-21 DIAGNOSIS — Z87891 Personal history of nicotine dependence: Secondary | ICD-10-CM | POA: Insufficient documentation

## 2020-10-21 DIAGNOSIS — Z882 Allergy status to sulfonamides status: Secondary | ICD-10-CM | POA: Diagnosis not present

## 2020-10-21 DIAGNOSIS — M069 Rheumatoid arthritis, unspecified: Secondary | ICD-10-CM | POA: Diagnosis not present

## 2020-10-21 DIAGNOSIS — Z79899 Other long term (current) drug therapy: Secondary | ICD-10-CM | POA: Diagnosis not present

## 2020-10-21 DIAGNOSIS — K219 Gastro-esophageal reflux disease without esophagitis: Secondary | ICD-10-CM | POA: Diagnosis not present

## 2020-10-21 HISTORY — PX: IR IMAGING GUIDED PORT INSERTION: IMG5740

## 2020-10-21 LAB — CBC WITH DIFFERENTIAL/PLATELET
Abs Immature Granulocytes: 0.03 10*3/uL (ref 0.00–0.07)
Basophils Absolute: 0.1 10*3/uL (ref 0.0–0.1)
Basophils Relative: 1 %
Eosinophils Absolute: 0.3 10*3/uL (ref 0.0–0.5)
Eosinophils Relative: 3 %
HCT: 39.7 % (ref 36.0–46.0)
Hemoglobin: 13.7 g/dL (ref 12.0–15.0)
Immature Granulocytes: 0 %
Lymphocytes Relative: 26 %
Lymphs Abs: 2.1 10*3/uL (ref 0.7–4.0)
MCH: 33.3 pg (ref 26.0–34.0)
MCHC: 34.5 g/dL (ref 30.0–36.0)
MCV: 96.6 fL (ref 80.0–100.0)
Monocytes Absolute: 1.1 10*3/uL — ABNORMAL HIGH (ref 0.1–1.0)
Monocytes Relative: 14 %
Neutro Abs: 4.6 10*3/uL (ref 1.7–7.7)
Neutrophils Relative %: 56 %
Platelets: 264 10*3/uL (ref 150–400)
RBC: 4.11 MIL/uL (ref 3.87–5.11)
RDW: 13.3 % (ref 11.5–15.5)
WBC: 8.2 10*3/uL (ref 4.0–10.5)
nRBC: 0 % (ref 0.0–0.2)

## 2020-10-21 LAB — PROTIME-INR
INR: 1 (ref 0.8–1.2)
Prothrombin Time: 12.7 seconds (ref 11.4–15.2)

## 2020-10-21 LAB — BASIC METABOLIC PANEL
Anion gap: 12 (ref 5–15)
BUN: 15 mg/dL (ref 8–23)
CO2: 24 mmol/L (ref 22–32)
Calcium: 9.2 mg/dL (ref 8.9–10.3)
Chloride: 104 mmol/L (ref 98–111)
Creatinine, Ser: 0.7 mg/dL (ref 0.44–1.00)
GFR, Estimated: >60 mL/min (ref >60)
Glucose, Bld: 95 mg/dL (ref 70–99)
Potassium: 3.8 mmol/L (ref 3.5–5.1)
Sodium: 140 mmol/L (ref 135–145)

## 2020-10-21 MED ORDER — FENTANYL CITRATE (PF) 100 MCG/2ML IJ SOLN
INTRAMUSCULAR | Status: AC | PRN
  Administered 2020-10-21: 50 ug via INTRAVENOUS

## 2020-10-21 MED ORDER — FENTANYL CITRATE (PF) 100 MCG/2ML IJ SOLN
INTRAMUSCULAR | Status: AC
  Filled 2020-10-21: qty 2

## 2020-10-21 MED ORDER — MIDAZOLAM HCL 2 MG/2ML IJ SOLN
INTRAMUSCULAR | Status: AC
  Filled 2020-10-21: qty 4

## 2020-10-21 MED ORDER — HEPARIN SOD (PORK) LOCK FLUSH 100 UNIT/ML IV SOLN
INTRAVENOUS | Status: AC
  Filled 2020-10-21: qty 5

## 2020-10-21 MED ORDER — SODIUM CHLORIDE 0.9 % IV SOLN: INTRAVENOUS | Status: DC

## 2020-10-21 MED ORDER — LIDOCAINE HCL 1 % IJ SOLN
INTRAMUSCULAR | Status: AC
  Filled 2020-10-21: qty 20

## 2020-10-21 MED ORDER — CEFAZOLIN SODIUM-DEXTROSE 2-4 GM/100ML-% IV SOLN
INTRAVENOUS | Status: AC
  Filled 2020-10-21: qty 100

## 2020-10-21 MED ORDER — MIDAZOLAM HCL 2 MG/2ML IJ SOLN
INTRAMUSCULAR | Status: AC | PRN
Start: 2020-10-21 — End: 2020-10-21
  Administered 2020-10-21: 1 mg via INTRAVENOUS

## 2020-10-21 MED ORDER — LIDOCAINE-EPINEPHRINE 1 %-1:100000 IJ SOLN
INTRAMUSCULAR | Status: AC
  Filled 2020-10-21: qty 1

## 2020-10-21 MED ORDER — CEFAZOLIN SODIUM-DEXTROSE 2-4 GM/100ML-% IV SOLN
2.0000 g | INTRAVENOUS | Status: AC
  Administered 2020-10-21: 2 g via INTRAVENOUS

## 2020-10-21 MED ORDER — LIDOCAINE-EPINEPHRINE (PF) 2 %-1:200000 IJ SOLN
INTRAMUSCULAR | Status: AC
  Filled 2020-10-21: qty 20

## 2020-10-21 NOTE — Procedures (Signed)
Interventional Radiology Procedure:   Indications: Urethral cancer  Procedure: Port placement  Findings: Right jugular port, tip at SVC/RA junction  Complications: None     EBL: Minimal, less than 10 ml  Plan: Discharge in one hour.  Keep port site and incisions dry for at least 24 hours.     Estil Vallee R. Anselm Pancoast, MD  Pager: [phone]

## 2020-10-21 NOTE — Discharge Instructions (Signed)
For questions /concerns may call Interventional Radiology at [phone]  You may remove your dressing and shower tomorrow afternoon  DO NOT use EMLA cream for 2 weeks after port placement as the cream will remove surgical glue on your incision.    Implanted Port Insertion, Care After This sheet gives you information about how to care for yourself after your procedure. Your health care provider may also give you more specific instructions. If you have problems or questions, contact your health care provider. What can I expect after the procedure? After the procedure, it is common to have:  Discomfort at the port insertion site.  Bruising on the skin over the port. This should improve over 3-4 days. Follow these instructions at home: [facility] care  After your port is placed, you will get a manufacturer's information card. The card has information about your port. Keep this card with you at all times.  Take care of the port as told by your health care provider. Ask your health care provider if you or a family member can get training for taking care of the port at home. A home health care nurse may also take care of the port.  Make sure to remember what type of port you have. Incision care      Follow instructions from your health care provider about how to take care of your port insertion site. Make sure you: ? Wash your hands with soap and water before and after you change your bandage (dressing). If soap and water are not available, use hand sanitizer. ? Change your dressing as told by your health care provider. ? Leave stitches (sutures), skin glue, or adhesive strips in place. These skin closures may need to stay in place for 2 weeks or longer. If adhesive strip edges start to loosen and curl up, you may trim the loose edges. Do not remove adhesive strips completely unless your health care provider tells you to do that.  Check your port insertion site every day for signs of  infection. Check for: ? Redness, swelling, or pain. ? Fluid or blood. ? Warmth. ? Pus or a bad smell. Activity  Return to your normal activities as told by your health care provider. Ask your health care provider what activities are safe for you.  Do not lift anything that is heavier than 10 lb (4.5 kg), or the limit that you are told, until your health care provider says that it is safe. General instructions  Take over-the-counter and prescription medicines only as told by your health care provider.  Do not take baths, swim, or use a hot tub until your health care provider approves. Ask your health care provider if you may take showers. You may only be allowed to take sponge baths.  Do not drive for 24 hours if you were given a sedative during your procedure.  Wear a medical alert bracelet in case of an emergency. This will tell any health care providers that you have a port.  Keep all follow-up visits as told by your health care provider. This is important. Contact a health care provider if:  You cannot flush your port with saline as directed, or you cannot draw blood from the port.  You have a fever or chills.  You have redness, swelling, or pain around your port insertion site.  You have fluid or blood coming from your port insertion site.  Your port insertion site feels warm to the touch.  You have pus or a bad  smell coming from the port insertion site. Get help right away if:  You have chest pain or shortness of breath.  You have bleeding from your port that you cannot control. Summary  Take care of the port as told by your health care provider. Keep the manufacturer's information card with you at all times.  Change your dressing as told by your health care provider.  Contact a health care provider if you have a fever or chills or if you have redness, swelling, or pain around your port insertion site.  Keep all follow-up visits as told by your health care  provider. This information is not intended to replace advice given to you by your health care provider. Make sure you discuss any questions you have with your health care provider. Document Revised: 05/16/2018 Document Reviewed: 05/16/2018 Elsevier Patient Education  [place]. Moderate Conscious Sedation, Adult, Care After These instructions provide you with information about caring for yourself after your procedure. Your health care provider may also give you more specific instructions. Your treatment has been planned according to current medical practices, but problems sometimes occur. Call your health care provider if you have any problems or questions after your procedure. What can I expect after the procedure? After your procedure, it is common:  To feel sleepy for several hours.  To feel clumsy and have poor balance for several hours.  To have poor judgment for several hours.  To vomit if you eat too soon. Follow these instructions at home: For at least 24 hours after the procedure:   Do not: ? Participate in activities where you could fall or become injured. ? Drive. ? Use heavy machinery. ? Drink alcohol. ? Take sleeping pills or medicines that cause drowsiness. ? Make important decisions or sign legal documents. ? Take care of children on your own.  Rest. Eating and drinking  Follow the diet recommended by your health care provider.  If you vomit: ? Drink water, juice, or soup when you can drink without vomiting. ? Make sure you have little or no nausea before eating solid foods. General instructions  Have a responsible adult stay with you until you are awake and alert.  Take over-the-counter and prescription medicines only as told by your health care provider.  If you smoke, do not smoke without supervision.  Keep all follow-up visits as told by your health care provider. This is important. Contact a health care provider if:  You keep feeling  nauseous or you keep vomiting.  You feel light-headed.  You develop a rash.  You have a fever. Get help right away if:  You have trouble breathing. This information is not intended to replace advice given to you by your health care provider. Make sure you discuss any questions you have with your health care provider. Document Revised: 09/30/2017 Document Reviewed: 02/07/2016 Elsevier Patient Education  2020 [employer].

## 2020-10-21 NOTE — Consult Note (Signed)
Chief Complaint: Patient was seen in consultation today for port a cath placement  Referring Physician(s): Wyatt Portela  Supervising Physician: Markus Daft  Patient Status: [facility] - Out-pt  History of Present Illness: Sandra Mann is a 69 y.o. female, ex-smoker, with history of GERD, rheumatoid arthritis, and newly diagnosed urethral carcinoma who presents today for Port-A-Cath placement for chemotherapy.  Past Medical History:  Diagnosis Date  . Chest pain   . GERD (gastroesophageal reflux disease)   . 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 Oral mass    left  . Osteopenia   . PONV (postoperative nausea and vomiting) 1988   "after tubes tied"  . Rheumatoid arteritis ([place])   . Rheumatoid arthritis ([place])   . Ulcerative proctitis ([place])   . Wears glasses     Past Surgical History:  Procedure Laterality Date  . COLONOSCOPY    . CYSTOSCOPY WITH URETHRAL DILATATION N/A 09/23/2020   Procedure: CYSTOSCOPY WITH URETHRAL DILATATION, EXAM UNDER ANESTHESIA, BIOPSY OF URETHRAL MASS;  Surgeon: Robley Fries, MD;  Location: [facility];  Service: Urology;  Laterality: N/A;  . EXCISION ORAL TUMOR Left 03/27/2014   Procedure: EXCISION OF ORAL CANCER;  Surgeon: Izora Gala, MD;  Location: [place];  Service: ENT;  Laterality: Left;  . FINGER GANGLION CYST EXCISION Right 2007   small dip  . INCISION AND DRAINAGE ABSCESS Left 06/12/2014   Procedure: LEFT INCISION AND DRAINAGE MANDIBULAR ABSCESS;  Surgeon: Izora Gala, MD;  Location: [place];  Service: ENT;  Laterality: Left;  . INCISION AND DRAINAGE ABSCESS Left 11/22/2014   Procedure: INCISION AND DRAINAGE LEFT  MANDIBULAR ABSCESS;  Surgeon: Izora Gala, MD;  Location: [place];  Service: ENT;  Laterality: Left;  . INCISION AND DRAINAGE OF PERITONSILLAR ABCESS Left 06/01/2014   Procedure: INCISION AND DRAINAGE Masticator Space Infection;  Surgeon: Izora Gala, MD;  Location: [place];  Service: ENT;  Laterality:  Left;  Marland Kitchen MULTIPLE EXTRACTIONS WITH ALVEOLOPLASTY Left 03/27/2014   Procedure: EXTRACTIONS OF NECESSARY TOOTH ;  Surgeon: Ceasar Mons, DDS;  Location: [place];  Service: Oral Surgery;  Laterality: Left;  . TUBAL LIGATION  ~ 1988    Allergies: Codeine, Sulfa antibiotics, and Vicodin [hydrocodone-acetaminophen]  Medications: Prior to Admission medications   Medication Sig Start Date End Date Taking? Authorizing Provider  cycloSPORINE (RESTASIS) 0.05 % ophthalmic emulsion Place 1 drop into both eyes 2 (two) times daily.   Yes [provider]  folic acid (FOLVITE) 664 MCG tablet Take 1,600 mcg by mouth daily.    Yes [provider]  mesalamine (CANASA) 1000 MG suppository Place 1,000 mg rectally every other day.    Yes [provider]  Multiple Vitamin (MULTIVITAMIN WITH MINERALS) TABS tablet Take 1 tablet by mouth daily.   Yes [provider]  omeprazole (PRILOSEC) 40 MG capsule Take 40 mg by mouth in the morning.    Yes [provider]  Estradiol 10 MCG TABS vaginal tablet Place 10 mcg vaginally daily.    [provider]  lidocaine-prilocaine (EMLA) cream Apply 1 application topically as needed. 10/10/20   Wyatt Portela, MD  methotrexate (RHEUMATREX) 2.5 MG tablet Take 20 mg by mouth once a week.     [provider]  prochlorperazine (COMPAZINE) 10 MG tablet Take 1 tablet (10 mg total) by mouth every 6 (six) hours as needed for nausea or vomiting. 10/10/20   Wyatt Portela, MD  Family History  Problem Relation Age of Onset  . Heart attack Mother   . Diabetes Maternal Grandmother     Social History   Socioeconomic History  . Marital status: Married    Spouse name: Not on file  . Number of children: Not on file  . Years of education: Not on file  . Highest education level: Not on file  Occupational History  . Not on file  Tobacco Use  . Smoking status: Former Smoker    Packs/day: 1.00     Years: 30.00    Pack years: 30.00    Types: Cigarettes    Quit date: 03/20/2000    Years since quitting: 20.6  . Smokeless tobacco: Never Used  Vaping Use  . Vaping Use: Never used  Substance and Sexual Activity  . Alcohol use: Yes    Comment: 05/28/2014 "drink a glass of wine ~ q night; none for the last week"  . Drug use: No  . Sexual activity: Not Currently    Birth control/protection: Post-menopausal  Other Topics Concern  . Not on file  Social History Narrative  . Not on file   Social Determinants of Health   Financial Resource Strain: Not on file  Food Insecurity: Not on file  Transportation Needs: Not on file  Physical Activity: Not on file  Stress: Not on file  Social Connections: Not on file      Review of Systems currently denies fever, headache, chest pain, dyspnea, cough, abdominal/back pain, nausea, vomiting; she does have some occasional blood-tinged urine; she has a bladder catheter in place  Vital Signs: BP (!) 144/68 (BP Location: Right Arm)   Pulse 69   Temp 97.6 F (36.4 C) (Oral)   Ht 5' 3"  (1.6 m)   Wt 120 lb (54.4 kg)   SpO2 100%   BMI 21.26 kg/m   Physical Exam awake, alert.  Chest clear to auscultation bilaterally.  Heart with regular rate and rhythm.  Abdomen soft, positive bowel sounds, nontender.  No lower extremity edema.  Bladder catheter in place.  Imaging: No results found.  Labs:  CBC: Recent Labs    09/22/20 0900 10/21/20 1225  WBC 7.5 8.2  HGB 13.1 13.7  HCT 38.5 39.7  PLT 237 264    COAGS: Recent Labs    10/21/20 1225  INR 1.0    BMP: Recent Labs    10/21/20 1225  NA 140  K 3.8  CL 104  CO2 24  GLUCOSE 95  BUN 15  CALCIUM 9.2  CREATININE 0.70  GFRNONAA >60    LIVER FUNCTION TESTS: No results for input(s): BILITOT, AST, ALT, ALKPHOS, PROT, ALBUMIN in the last 8760 hours.  TUMOR MARKERS: No results for input(s): AFPTM, CEA, CA199, CHROMGRNA in the last 8760 hours.  Assessment and Plan: 69 y.o.  female, ex-smoker, with history of GERD, rheumatoid arthritis, and newly diagnosed urethral carcinoma who presents today for Port-A-Cath placement for chemotherapy.Risks and benefits of image guided port-a-catheter placement was discussed with the patient including, but not limited to bleeding, infection, pneumothorax, or fibrin sheath development and need for additional procedures.  All of the patient's questions were answered, patient is agreeable to proceed. Consent signed and in chart.     Thank you for this interesting consult.  I greatly enjoyed meeting Sandra Mann and look forward to participating in their care.  A copy of this report was sent to the requesting provider on this date.  Electronically Signed: D. Rowe Robert, PA-C  10/21/2020, 1:13 PM   I spent a total of  25 minutes   in face to face in clinical consultation, greater than 50% of which was counseling/coordinating care for Port-A-Cath placement

## 2020-10-22 NOTE — Progress Notes (Signed)
Pharmacist Chemotherapy Monitoring - Initial Assessment    Anticipated start date: 10/29/20  Regimen:  . Are orders appropriate based on the patient's diagnosis, regimen, and cycle? Yes . Does the plan date match the patient's scheduled date? Yes . Is the sequencing of drugs appropriate? Yes . Are the premedications appropriate for the patient's regimen? Yes . Prior Authorization for treatment is: Approved o If applicable, is the correct biosimilar selected based on the patient's insurance? not applicable  Organ Function and Labs: Marland Kitchen Are dose adjustments needed based on the patient's renal function, hepatic function, or hematologic function? Yes . Are appropriate labs ordered prior to the start of patient's treatment? Yes . Other organ system assessment, if indicated: N/A . The following baseline labs, if indicated, have been ordered: cisplatin: K, Mg  Dose Assessment: . Are the drug doses appropriate? Yes . Are the following correct: o Drug concentrations Yes o IV fluid compatible with drug Yes o Administration routes Yes o Timing of therapy Yes . If applicable, does the patient have documented access for treatment and/or plans for port-a-cath placement? yes . If applicable, have lifetime cumulative doses been properly documented and assessed? yes Lifetime Dose Tracking  No doses have been documented on this patient for the following tracked chemicals: Doxorubicin, Epirubicin, Idarubicin, Daunorubicin, Mitoxantrone, Bleomycin, Oxaliplatin, Carboplatin, Liposomal Doxorubicin  o   Toxicity Monitoring/Prevention: . The patient has the following take home antiemetics prescribed: Prochlorperazine . The patient has the following take home medications prescribed: N/A . Medication allergies and previous infusion related reactions, if applicable, have been reviewed and addressed. Yes . The patient's current medication list has been assessed for drug-drug interactions with their chemotherapy  regimen. no significant drug-drug interactions were identified on review.  Order Review: . Are the treatment plan orders signed? Yes . Is the patient scheduled to see a provider prior to their treatment? No  I verify that I have reviewed each item in the above checklist and answered each question accordingly.  Philomena Course 10/22/2020 10:06 AM

## 2020-10-29 ENCOUNTER — Inpatient Hospital Stay: Payer: Medicare Other

## 2020-10-29 ENCOUNTER — Other Ambulatory Visit: Payer: Self-pay

## 2020-10-29 VITALS — BP 113/70 | HR 71 | Temp 98.5°F | Resp 18

## 2020-10-29 DIAGNOSIS — C680 Malignant neoplasm of urethra: Secondary | ICD-10-CM | POA: Diagnosis present

## 2020-10-29 DIAGNOSIS — Z8249 Family history of ischemic heart disease and other diseases of the circulatory system: Secondary | ICD-10-CM | POA: Diagnosis not present

## 2020-10-29 DIAGNOSIS — M069 Rheumatoid arthritis, unspecified: Secondary | ICD-10-CM | POA: Diagnosis not present

## 2020-10-29 DIAGNOSIS — Z87891 Personal history of nicotine dependence: Secondary | ICD-10-CM | POA: Diagnosis not present

## 2020-10-29 DIAGNOSIS — Z5111 Encounter for antineoplastic chemotherapy: Secondary | ICD-10-CM | POA: Diagnosis not present

## 2020-10-29 DIAGNOSIS — Z95828 Presence of other vascular implants and grafts: Secondary | ICD-10-CM

## 2020-10-29 DIAGNOSIS — Z79899 Other long term (current) drug therapy: Secondary | ICD-10-CM | POA: Diagnosis not present

## 2020-10-29 DIAGNOSIS — M8580 Other specified disorders of bone density and structure, unspecified site: Secondary | ICD-10-CM | POA: Diagnosis not present

## 2020-10-29 DIAGNOSIS — Z833 Family history of diabetes mellitus: Secondary | ICD-10-CM | POA: Diagnosis not present

## 2020-10-29 DIAGNOSIS — K219 Gastro-esophageal reflux disease without esophagitis: Secondary | ICD-10-CM | POA: Diagnosis not present

## 2020-10-29 LAB — CBC WITH DIFFERENTIAL (CANCER CENTER ONLY)
Abs Immature Granulocytes: 0.03 10*3/uL (ref 0.00–0.07)
Basophils Absolute: 0.1 10*3/uL (ref 0.0–0.1)
Basophils Relative: 1 %
Eosinophils Absolute: 0.2 10*3/uL (ref 0.0–0.5)
Eosinophils Relative: 2 %
HCT: 38.1 % (ref 36.0–46.0)
Hemoglobin: 13.2 g/dL (ref 12.0–15.0)
Immature Granulocytes: 0 %
Lymphocytes Relative: 21 %
Lymphs Abs: 2.1 10*3/uL (ref 0.7–4.0)
MCH: 32.8 pg (ref 26.0–34.0)
MCHC: 34.6 g/dL (ref 30.0–36.0)
MCV: 94.8 fL (ref 80.0–100.0)
Monocytes Absolute: 1.1 10*3/uL — ABNORMAL HIGH (ref 0.1–1.0)
Monocytes Relative: 11 %
Neutro Abs: 6.5 10*3/uL (ref 1.7–7.7)
Neutrophils Relative %: 65 %
Platelet Count: 243 10*3/uL (ref 150–400)
RBC: 4.02 MIL/uL (ref 3.87–5.11)
RDW: 13 % (ref 11.5–15.5)
WBC Count: 10.1 10*3/uL (ref 4.0–10.5)
nRBC: 0 % (ref 0.0–0.2)

## 2020-10-29 LAB — CMP (CANCER CENTER ONLY)
ALT: 17 U/L (ref 0–44)
AST: 26 U/L (ref 15–41)
Albumin: 3.9 g/dL (ref 3.5–5.0)
Alkaline Phosphatase: 80 U/L (ref 38–126)
Anion gap: 7 (ref 5–15)
BUN: 12 mg/dL (ref 8–23)
CO2: 26 mmol/L (ref 22–32)
Calcium: 9.3 mg/dL (ref 8.9–10.3)
Chloride: 105 mmol/L (ref 98–111)
Creatinine: 0.66 mg/dL (ref 0.44–1.00)
GFR, Estimated: >60 mL/min (ref >60)
Glucose, Bld: 96 mg/dL (ref 70–99)
Potassium: 4.2 mmol/L (ref 3.5–5.1)
Sodium: 138 mmol/L (ref 135–145)
Total Bilirubin: 0.7 mg/dL (ref 0.3–1.2)
Total Protein: 7 g/dL (ref 6.5–8.1)

## 2020-10-29 LAB — MAGNESIUM: Magnesium: 1.9 mg/dL (ref 1.7–2.4)

## 2020-10-29 MED ORDER — SODIUM CHLORIDE 0.9 % IV SOLN
150.0000 mg | Freq: Once | INTRAVENOUS | Status: AC
  Administered 2020-10-29: 150 mg via INTRAVENOUS
  Filled 2020-10-29: qty 150

## 2020-10-29 MED ORDER — SODIUM CHLORIDE 0.9 % IV SOLN
Freq: Once | INTRAVENOUS | Status: AC
  Filled 2020-10-29: qty 250

## 2020-10-29 MED ORDER — HEPARIN SOD (PORK) LOCK FLUSH 100 UNIT/ML IV SOLN
500.0000 [IU] | Freq: Once | INTRAVENOUS | Status: AC | PRN
  Administered 2020-10-29: 500 [IU]
  Filled 2020-10-29: qty 5

## 2020-10-29 MED ORDER — SODIUM CHLORIDE 0.9 % IV SOLN
1000.0000 mg/m2 | Freq: Once | INTRAVENOUS | Status: AC
  Administered 2020-10-29: 1520 mg via INTRAVENOUS
  Filled 2020-10-29: qty 39.98

## 2020-10-29 MED ORDER — PALONOSETRON HCL INJECTION 0.25 MG/5ML
0.2500 mg | Freq: Once | INTRAVENOUS | Status: AC
  Administered 2020-10-29: 0.25 mg via INTRAVENOUS

## 2020-10-29 MED ORDER — MAGNESIUM SULFATE 2 GM/50ML IV SOLN
INTRAVENOUS | Status: AC
  Filled 2020-10-29: qty 50

## 2020-10-29 MED ORDER — SODIUM CHLORIDE 0.9% FLUSH
10.0000 mL | Freq: Once | INTRAVENOUS | Status: AC
  Administered 2020-10-29: 10 mL via INTRAVENOUS
  Filled 2020-10-29: qty 10

## 2020-10-29 MED ORDER — PALONOSETRON HCL INJECTION 0.25 MG/5ML
INTRAVENOUS | Status: AC
  Filled 2020-10-29: qty 5

## 2020-10-29 MED ORDER — SODIUM CHLORIDE 0.9 % IV SOLN
70.0000 mg/m2 | Freq: Once | INTRAVENOUS | Status: AC
  Administered 2020-10-29: 107 mg via INTRAVENOUS
  Filled 2020-10-29: qty 107

## 2020-10-29 MED ORDER — POTASSIUM CHLORIDE IN NACL 20-0.9 MEQ/L-% IV SOLN
Freq: Once | INTRAVENOUS | Status: AC
  Filled 2020-10-29: qty 1000

## 2020-10-29 MED ORDER — SODIUM CHLORIDE 0.9% FLUSH
10.0000 mL | INTRAVENOUS | Status: DC | PRN
  Administered 2020-10-29: 10 mL
  Filled 2020-10-29: qty 10

## 2020-10-29 MED ORDER — SODIUM CHLORIDE 0.9 % IV SOLN
10.0000 mg | Freq: Once | INTRAVENOUS | Status: AC
  Administered 2020-10-29: 10 mg via INTRAVENOUS
  Filled 2020-10-29: qty 10

## 2020-10-29 MED ORDER — MAGNESIUM SULFATE 2 GM/50ML IV SOLN
2.0000 g | Freq: Once | INTRAVENOUS | Status: AC
  Administered 2020-10-29: 2 g via INTRAVENOUS

## 2020-10-29 NOTE — Patient Instructions (Signed)
[facility] Discharge Instructions for Patients Receiving Chemotherapy  Today you received the following chemotherapy agents Cisplatin; Gemzar  To help prevent nausea and vomiting after your treatment, we encourage you to take your nausea medication as directed   If you develop nausea and vomiting that is not controlled by your nausea medication, call the clinic.   BELOW ARE SYMPTOMS THAT SHOULD BE REPORTED IMMEDIATELY:  *FEVER GREATER THAN 100.5 F  *CHILLS WITH OR WITHOUT FEVER  NAUSEA AND VOMITING THAT IS NOT CONTROLLED WITH YOUR NAUSEA MEDICATION  *UNUSUAL SHORTNESS OF BREATH  *UNUSUAL BRUISING OR BLEEDING  TENDERNESS IN MOUTH AND THROAT WITH OR WITHOUT PRESENCE OF ULCERS  *URINARY PROBLEMS  *BOWEL PROBLEMS  UNUSUAL RASH Items with * indicate a potential emergency and should be followed up as soon as possible.  Feel free to call the clinic should you have any questions or concerns. The clinic phone number is [phone].  Please show the [facility] at check-in to the Emergency Department and triage nurse.  Cisplatin injection What is this medicine? CISPLATIN (SIS pla tin) is a chemotherapy drug. It targets fast dividing cells, like cancer cells, and causes these cells to die. This medicine is used to treat many types of cancer like bladder, ovarian, and testicular cancers. This medicine may be used for other purposes; ask your health care provider or pharmacist if you have questions. COMMON BRAND NAME(S): Platinol, Platinol -AQ What should I tell my health care provider before I take this medicine? They need to know if you have any of these conditions:  eye disease, vision problems  hearing problems  kidney disease  low blood counts, like white cells, platelets, or red blood cells  tingling of the fingers or toes, or other nerve disorder  an unusual or allergic reaction to cisplatin, carboplatin, oxaliplatin, other medicines, foods, dyes,  or preservatives  pregnant or trying to get pregnant  breast-feeding How should I use this medicine? This drug is given as an infusion into a vein. It is administered in a hospital or clinic by a specially trained health care professional. Talk to your pediatrician regarding the use of this medicine in children. Special care may be needed. Overdosage: If you think you have taken too much of this medicine contact a poison control center or emergency room at once. NOTE: This medicine is only for you. Do not share this medicine with others. What if I miss a dose? It is important not to miss a dose. Call your doctor or health care professional if you are unable to keep an appointment. What may interact with this medicine? This medicine may interact with the following medications:  foscarnet  certain antibiotics like amikacin, gentamicin, neomycin, polymyxin B, streptomycin, tobramycin, vancomycin This list may not describe all possible interactions. Give your health care provider a list of all the medicines, herbs, non-prescription drugs, or dietary supplements you use. Also tell them if you smoke, drink alcohol, or use illegal drugs. Some items may interact with your medicine. What should I watch for while using this medicine? Your condition will be monitored carefully while you are receiving this medicine. You will need important blood work done while you are taking this medicine. This drug may make you feel generally unwell. This is not uncommon, as chemotherapy can affect healthy cells as well as cancer cells. Report any side effects. Continue your course of treatment even though you feel ill unless your doctor tells you to stop. This medicine may increase  your risk of getting an infection. Call your healthcare professional for advice if you get a fever, chills, or sore throat, or other symptoms of a cold or flu. Do not treat yourself. Try to avoid being around people who are sick. Avoid  taking medicines that contain aspirin, acetaminophen, ibuprofen, naproxen, or ketoprofen unless instructed by your healthcare professional. These medicines may hide a fever. This medicine may increase your risk to bruise or bleed. Call your doctor or health care professional if you notice any unusual bleeding. Be careful brushing and flossing your teeth or using a toothpick because you may get an infection or bleed more easily. If you have any dental work done, tell your dentist you are receiving this medicine. Do not become pregnant while taking this medicine or for 14 months after stopping it. Women should inform their healthcare professional if they wish to become pregnant or think they might be pregnant. Men should not father a child while taking this medicine and for 11 months after stopping it. There is potential for serious side effects to an unborn child. Talk to your healthcare professional for more information. Do not breast-feed an infant while taking this medicine. This medicine has caused ovarian failure in some women. This medicine may make it more difficult to get pregnant. Talk to your healthcare professional if you are concerned about your fertility. This medicine has caused decreased sperm counts in some men. This may make it more difficult to father a child. Talk to your healthcare professional if you are concerned about your fertility. Drink fluids as directed while you are taking this medicine. This will help protect your kidneys. Call your doctor or health care professional if you get diarrhea. Do not treat yourself. What side effects may I notice from receiving this medicine? Side effects that you should report to your doctor or health care professional as soon as possible:  allergic reactions like skin rash, itching or hives, swelling of the face, lips, or tongue  blurred vision  changes in vision  decreased hearing or ringing of the ears  nausea, vomiting  pain,  redness, or irritation at site where injected  pain, tingling, numbness in the hands or feet  signs and symptoms of bleeding such as bloody or black, tarry stools; red or dark brown urine; spitting up blood or brown material that looks like coffee grounds; red spots on the skin; unusual bruising or bleeding from the eyes, gums, or nose  signs and symptoms of infection like fever; chills; cough; sore throat; pain or trouble passing urine  signs and symptoms of kidney injury like trouble passing urine or change in the amount of urine  signs and symptoms of low red blood cells or anemia such as unusually weak or tired; feeling faint or lightheaded; falls; breathing problems Side effects that usually do not require medical attention (report to your doctor or health care professional if they continue or are bothersome):  loss of appetite  mouth sores  muscle cramps This list may not describe all possible side effects. Call your doctor for medical advice about side effects. You may report side effects to FDA at 1-800-FDA-1088. Where should I keep my medicine? This drug is given in a hospital or clinic and will not be stored at home. NOTE: This sheet is a summary. It may not cover all possible information. If you have questions about this medicine, talk to your doctor, pharmacist, or health care provider.  2020 Elsevier/Gold Standard (2018-10-13 15:59:17)  Gemcitabine injection  What is this medicine? GEMCITABINE (jem SYE ta been) is a chemotherapy drug. This medicine is used to treat many types of cancer like breast cancer, lung cancer, pancreatic cancer, and ovarian cancer. This medicine may be used for other purposes; ask your health care provider or pharmacist if you have questions. COMMON BRAND NAME(S): Gemzar, Infugem What should I tell my health care provider before I take this medicine? They need to know if you have any of these conditions:  blood disorders  infection  kidney  disease  liver disease  lung or breathing disease, like asthma  recent or ongoing radiation therapy  an unusual or allergic reaction to gemcitabine, other chemotherapy, other medicines, foods, dyes, or preservatives  pregnant or trying to get pregnant  breast-feeding How should I use this medicine? This drug is given as an infusion into a vein. It is administered in a hospital or clinic by a specially trained health care professional. Talk to your pediatrician regarding the use of this medicine in children. Special care may be needed. Overdosage: If you think you have taken too much of this medicine contact a poison control center or emergency room at once. NOTE: This medicine is only for you. Do not share this medicine with others. What if I miss a dose? It is important not to miss your dose. Call your doctor or health care professional if you are unable to keep an appointment. What may interact with this medicine?  medicines to increase blood counts like filgrastim, pegfilgrastim, sargramostim  some other chemotherapy drugs like cisplatin  vaccines Talk to your doctor or health care professional before taking any of these medicines:  acetaminophen  aspirin  ibuprofen  ketoprofen  naproxen This list may not describe all possible interactions. Give your health care provider a list of all the medicines, herbs, non-prescription drugs, or dietary supplements you use. Also tell them if you smoke, drink alcohol, or use illegal drugs. Some items may interact with your medicine. What should I watch for while using this medicine? Visit your doctor for checks on your progress. This drug may make you feel generally unwell. This is not uncommon, as chemotherapy can affect healthy cells as well as cancer cells. Report any side effects. Continue your course of treatment even though you feel ill unless your doctor tells you to stop. In some cases, you may be given additional medicines to  help with side effects. Follow all directions for their use. Call your doctor or health care professional for advice if you get a fever, chills or sore throat, or other symptoms of a cold or flu. Do not treat yourself. This drug decreases your body's ability to fight infections. Try to avoid being around people who are sick. This medicine may increase your risk to bruise or bleed. Call your doctor or health care professional if you notice any unusual bleeding. Be careful brushing and flossing your teeth or using a toothpick because you may get an infection or bleed more easily. If you have any dental work done, tell your dentist you are receiving this medicine. Avoid taking products that contain aspirin, acetaminophen, ibuprofen, naproxen, or ketoprofen unless instructed by your doctor. These medicines may hide a fever. Do not become pregnant while taking this medicine or for 6 months after stopping it. Women should inform their doctor if they wish to become pregnant or think they might be pregnant. Men should not father a child while taking this medicine and for 3 months after stopping it. There  is a potential for serious side effects to an unborn child. Talk to your health care professional or pharmacist for more information. Do not breast-feed an infant while taking this medicine or for at least 1 week after stopping it. Men should inform their doctors if they wish to father a child. This medicine may lower sperm counts. Talk with your doctor or health care professional if you are concerned about your fertility. What side effects may I notice from receiving this medicine? Side effects that you should report to your doctor or health care professional as soon as possible:  allergic reactions like skin rash, itching or hives, swelling of the face, lips, or tongue  breathing problems  pain, redness, or irritation at site where injected  signs and symptoms of a dangerous change in heartbeat or heart  rhythm like chest pain; dizziness; fast or irregular heartbeat; palpitations; feeling faint or lightheaded, falls; breathing problems  signs of decreased platelets or bleeding - bruising, pinpoint red spots on the skin, black, tarry stools, blood in the urine  signs of decreased red blood cells - unusually weak or tired, feeling faint or lightheaded, falls  signs of infection - fever or chills, cough, sore throat, pain or difficulty passing urine  signs and symptoms of kidney injury like trouble passing urine or change in the amount of urine  signs and symptoms of liver injury like dark yellow or brown urine; general ill feeling or flu-like symptoms; light-colored stools; loss of appetite; nausea; right upper belly pain; unusually weak or tired; yellowing of the eyes or skin  swelling of ankles, feet, hands Side effects that usually do not require medical attention (report to your doctor or health care professional if they continue or are bothersome):  constipation  diarrhea  hair loss  loss of appetite  nausea  rash  vomiting This list may not describe all possible side effects. Call your doctor for medical advice about side effects. You may report side effects to FDA at 1-800-FDA-1088. Where should I keep my medicine? This drug is given in a hospital or clinic and will not be stored at home. NOTE: This sheet is a summary. It may not cover all possible information. If you have questions about this medicine, talk to your doctor, pharmacist, or health care provider.  2020 Elsevier/Gold Standard (2018-01-11 18:06:11)

## 2020-10-30 ENCOUNTER — Telehealth: Payer: Self-pay | Admitting: *Deleted

## 2020-10-30 NOTE — Telephone Encounter (Signed)
-----   Message from Priscille Loveless, RN sent at 10/29/2020  9:10 AM EST ----- Regarding: First Chemo Shadad First Gemzar; cisplatin follow up

## 2020-10-30 NOTE — Telephone Encounter (Signed)
Called pt to see how she did post treatment yesterday.  She reports doing OK.  She did take her nausea med last eve & slept.  She has eaten this am.  She denies N/V.  She states no BM this am but is drinking lots of fluids & took some fiber this am.  Discussed fruits/veggies & if needed can take stool softener/mild laxative.  She expressed understanding & knows how to reach [place].

## 2020-11-05 ENCOUNTER — Inpatient Hospital Stay: Payer: Medicare Other | Admitting: Oncology

## 2020-11-05 ENCOUNTER — Inpatient Hospital Stay: Payer: Medicare Other

## 2020-11-05 ENCOUNTER — Other Ambulatory Visit: Payer: Self-pay

## 2020-11-05 ENCOUNTER — Inpatient Hospital Stay: Payer: Medicare Other | Attending: Oncology

## 2020-11-05 VITALS — BP 139/68 | HR 80 | Temp 98.2°F | Resp 18 | Ht 63.0 in | Wt 124.2 lb

## 2020-11-05 DIAGNOSIS — C680 Malignant neoplasm of urethra: Secondary | ICD-10-CM | POA: Insufficient documentation

## 2020-11-05 DIAGNOSIS — Z95828 Presence of other vascular implants and grafts: Secondary | ICD-10-CM | POA: Diagnosis not present

## 2020-11-05 DIAGNOSIS — K5900 Constipation, unspecified: Secondary | ICD-10-CM | POA: Insufficient documentation

## 2020-11-05 DIAGNOSIS — Z5111 Encounter for antineoplastic chemotherapy: Secondary | ICD-10-CM | POA: Insufficient documentation

## 2020-11-05 LAB — CMP (CANCER CENTER ONLY)
ALT: 21 U/L (ref 0–44)
AST: 24 U/L (ref 15–41)
Albumin: 3.6 g/dL (ref 3.5–5.0)
Alkaline Phosphatase: 84 U/L (ref 38–126)
Anion gap: 8 (ref 5–15)
BUN: 17 mg/dL (ref 8–23)
CO2: 25 mmol/L (ref 22–32)
Calcium: 9.2 mg/dL (ref 8.9–10.3)
Chloride: 102 mmol/L (ref 98–111)
Creatinine: 0.84 mg/dL (ref 0.44–1.00)
GFR, Estimated: >60 mL/min (ref >60)
Glucose, Bld: 91 mg/dL (ref 70–99)
Potassium: 4 mmol/L (ref 3.5–5.1)
Sodium: 135 mmol/L (ref 135–145)
Total Bilirubin: 0.7 mg/dL (ref 0.3–1.2)
Total Protein: 6.9 g/dL (ref 6.5–8.1)

## 2020-11-05 LAB — CBC WITH DIFFERENTIAL (CANCER CENTER ONLY)
Abs Immature Granulocytes: 0.02 10*3/uL (ref 0.00–0.07)
Basophils Absolute: 0.1 10*3/uL (ref 0.0–0.1)
Basophils Relative: 1 %
Eosinophils Absolute: 0.2 10*3/uL (ref 0.0–0.5)
Eosinophils Relative: 2 %
HCT: 35 % — ABNORMAL LOW (ref 36.0–46.0)
Hemoglobin: 12.4 g/dL (ref 12.0–15.0)
Immature Granulocytes: 0 %
Lymphocytes Relative: 23 %
Lymphs Abs: 1.8 10*3/uL (ref 0.7–4.0)
MCH: 32.9 pg (ref 26.0–34.0)
MCHC: 35.4 g/dL (ref 30.0–36.0)
MCV: 92.8 fL (ref 80.0–100.0)
Monocytes Absolute: 0.7 10*3/uL (ref 0.1–1.0)
Monocytes Relative: 9 %
Neutro Abs: 5.2 10*3/uL (ref 1.7–7.7)
Neutrophils Relative %: 65 %
Platelet Count: 190 10*3/uL (ref 150–400)
RBC: 3.77 MIL/uL — ABNORMAL LOW (ref 3.87–5.11)
RDW: 12.1 % (ref 11.5–15.5)
WBC Count: 8 10*3/uL (ref 4.0–10.5)
nRBC: 0 % (ref 0.0–0.2)

## 2020-11-05 MED ORDER — SODIUM CHLORIDE 0.9% FLUSH
10.0000 mL | INTRAVENOUS | Status: DC | PRN
  Administered 2020-11-05: 10 mL
  Filled 2020-11-05: qty 10

## 2020-11-05 MED ORDER — SODIUM CHLORIDE 0.9 % IV SOLN
1000.0000 mg/m2 | Freq: Once | INTRAVENOUS | Status: AC
  Administered 2020-11-05: 1520 mg via INTRAVENOUS
  Filled 2020-11-05: qty 39.98

## 2020-11-05 MED ORDER — HEPARIN SOD (PORK) LOCK FLUSH 100 UNIT/ML IV SOLN
500.0000 [IU] | Freq: Once | INTRAVENOUS | Status: AC | PRN
Start: 2020-11-05 — End: 2020-11-05
  Administered 2020-11-05: 500 [IU]
  Filled 2020-11-05: qty 5

## 2020-11-05 MED ORDER — PROCHLORPERAZINE MALEATE 10 MG PO TABS
ORAL_TABLET | ORAL | Status: AC
  Filled 2020-11-05: qty 1

## 2020-11-05 MED ORDER — SODIUM CHLORIDE 0.9% FLUSH
10.0000 mL | INTRAVENOUS | Status: DC | PRN
  Administered 2020-11-05: 10 mL via INTRAVENOUS
  Filled 2020-11-05: qty 10

## 2020-11-05 MED ORDER — PROCHLORPERAZINE MALEATE 10 MG PO TABS
10.0000 mg | ORAL_TABLET | Freq: Once | ORAL | Status: AC
  Administered 2020-11-05: 10 mg via ORAL

## 2020-11-05 MED ORDER — SODIUM CHLORIDE 0.9 % IV SOLN
Freq: Once | INTRAVENOUS | Status: AC
Start: 2020-11-05 — End: 2020-11-05
  Filled 2020-11-05: qty 250

## 2020-11-05 NOTE — Progress Notes (Signed)
Hematology and Oncology Follow Up Visit  Sandra Mann [id number] 11-27-1950 70 y.o. 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  She presented with pelvic tumor with squamous differentiation.  Prior Therapy: She is status post cystoscopy and biopsy in November 2021 which showed high-grade urothelial carcinoma.   Current therapy: Neoadjuvant chemotherapy utilizing gemcitabine and cisplatin started on October 29, 2020.  She is here for day 8 of cycle 1 of therapy.   Interim History: Sandra Mann returns today for a follow-up visit.  Since the last visit she started the first chemotherapy without any major complications.  She denies any nausea, vomiting abdominal pain or hematuria.  She does report constipation although she has not taken any stool softeners yet.  She continues to be active and attends to activities of daily living.  She does report some fatigue and tiredness.    Medications: I have reviewed the patient's current medications.  Current Outpatient Medications  Medication Sig Dispense Refill  . cycloSPORINE (RESTASIS) 0.05 % ophthalmic emulsion Place 1 drop into both eyes 2 (two) times daily.    . Estradiol 10 MCG TABS vaginal tablet Place 10 mcg vaginally daily.    . folic acid (FOLVITE) 884 MCG tablet Take 1,600 mcg by mouth daily.     Marland Kitchen lidocaine-prilocaine (EMLA) cream Apply 1 application topically as needed. 30 g 0  . mesalamine (CANASA) 1000 MG suppository Place 1,000 mg rectally every other day.     . methotrexate (RHEUMATREX) 2.5 MG tablet Take 20 mg by mouth once a week.     . Multiple Vitamin (MULTIVITAMIN WITH MINERALS) TABS tablet Take 1 tablet by mouth daily.    Marland Kitchen omeprazole (PRILOSEC) 40 MG capsule Take 40 mg by mouth in the morning.     . prochlorperazine (COMPAZINE) 10 MG tablet Take 1 tablet (10 mg total) by mouth every 6 (six) hours as needed for  nausea or vomiting. 30 tablet 0   No current facility-administered medications for this visit.   Facility-Administered Medications Ordered in Other Visits  Medication Dose Route Frequency Provider Last Rate Last Admin  . sodium chloride flush (NS) 0.9 % injection 10 mL  10 mL Intravenous PRN Sandra Portela, MD   10 mL at 11/05/20 1660     Allergies:  Allergies  Allergen Reactions  . Codeine Nausea And Vomiting  . Sulfa Antibiotics Nausea And Vomiting  . Vicodin [Hydrocodone-Acetaminophen] Nausea And Vomiting      Physical Exam: Blood pressure 139/68, pulse 80, temperature 98.2 F (36.8 C), temperature source Tympanic, resp. rate 18, height 5' 3"  (1.6 m), weight 124 lb 3.2 oz (56.3 kg), SpO2 98 %.   ECOG:    General appearance: Comfortable appearing without any discomfort Head: Normocephalic without any trauma Oropharynx: Mucous membranes are moist and pink without any thrush or ulcers. Eyes: Pupils are equal and round reactive to light. Lymph nodes: No cervical, supraclavicular, inguinal or axillary lymphadenopathy.   Heart:regular rate and rhythm.  S1 and S2 without leg edema. Lung: Clear without any rhonchi or wheezes.  No dullness to percussion. Abdomin: Soft, nontender, nondistended with good bowel sounds.  No hepatosplenomegaly. Musculoskeletal: No joint deformity or effusion.  Full range of motion noted. Neurological: No deficits noted on motor, sensory and deep tendon reflex exam. Skin: No petechial rash or dryness.  Appeared moist.      Lab Results: Lab Results  Component Value Date  WBC 10.1 10/29/2020   HGB 13.2 10/29/2020   HCT 38.1 10/29/2020   MCV 94.8 10/29/2020   PLT 243 10/29/2020     Chemistry      Component Value Date/Time   NA 138 10/29/2020 0810   K 4.2 10/29/2020 0810   CL 105 10/29/2020 0810   CO2 26 10/29/2020 0810   BUN 12 10/29/2020 0810   CREATININE 0.66 10/29/2020 0810      Component Value Date/Time   CALCIUM 9.3 10/29/2020  0810   ALKPHOS 80 10/29/2020 0810   AST 26 10/29/2020 0810   ALT 17 10/29/2020 0810   BILITOT 0.7 10/29/2020 0810        Impression and Plan:  70 year old woman with:  1.    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  She was found to have 4.0 x 3.0 x 2.8 cm tumor involving the entire urethra extending from the bladder base and possibly involving the anterior vaginal wall.    She is currently receiving neoadjuvant chemotherapy utilizing gemcitabine and cisplatin without any complications.  Risks and benefits of proceeding with this therapy to complete 4 cycles and repeat imaging studies after that would be the plan.  She has excellent response then surgical resection would be recommended.  If surgery is not an option, consolidation with radiation concomitantly with chemotherapy would be recommended.  2. IV access:Port-A-Cath inserted without any issues.  3. Antiemetics: No nausea or vomiting reported at this time.  Compazine is available to her.  4. Renal function surveillance:Her kidney function remained stable on platinum based therapy.  We will monitor closely.  5. Goals of care:  Aggressive therapy is warranted given his excellent performance status and potential curable disease.  6.  Constipation: We discussed strategies to improve that include increasing hydration, increase fiber in her diet and using stool softeners.  7. Follow-up: In 2 weeks with start of cycle 2 of therapy.  60  minutes were spent on this encounter.  Time was dedicated to reviewing her disease status, addressing complications related to her cancer and cancer therapy.   Sandra Button, MD 1/5/20227:58 AM

## 2020-11-05 NOTE — Patient Instructions (Signed)
[facility] Discharge Instructions for Patients Receiving Chemotherapy  Today you received the following chemotherapy agents gemzar  To help prevent nausea and vomiting after your treatment, we encourage you to take your nausea medication as directed.  If you develop nausea and vomiting that is not controlled by your nausea medication, call the clinic.   BELOW ARE SYMPTOMS THAT SHOULD BE REPORTED IMMEDIATELY:  *FEVER GREATER THAN 100.5 F  *CHILLS WITH OR WITHOUT FEVER  NAUSEA AND VOMITING THAT IS NOT CONTROLLED WITH YOUR NAUSEA MEDICATION  *UNUSUAL SHORTNESS OF BREATH  *UNUSUAL BRUISING OR BLEEDING  TENDERNESS IN MOUTH AND THROAT WITH OR WITHOUT PRESENCE OF ULCERS  *URINARY PROBLEMS  *BOWEL PROBLEMS  UNUSUAL RASH Items with * indicate a potential emergency and should be followed up as soon as possible.  Feel free to call the clinic you have any questions or concerns. The clinic phone number is [phone].  

## 2020-11-19 ENCOUNTER — Inpatient Hospital Stay: Payer: Medicare Other

## 2020-11-19 ENCOUNTER — Inpatient Hospital Stay: Payer: Medicare Other | Admitting: Oncology

## 2020-11-19 ENCOUNTER — Other Ambulatory Visit: Payer: Self-pay

## 2020-11-19 VITALS — BP 148/73 | HR 88 | Temp 97.8°F | Resp 18 | Ht 63.0 in | Wt 125.3 lb

## 2020-11-19 DIAGNOSIS — Z95828 Presence of other vascular implants and grafts: Secondary | ICD-10-CM

## 2020-11-19 DIAGNOSIS — C680 Malignant neoplasm of urethra: Secondary | ICD-10-CM

## 2020-11-19 DIAGNOSIS — Z5111 Encounter for antineoplastic chemotherapy: Secondary | ICD-10-CM | POA: Diagnosis not present

## 2020-11-19 LAB — CBC WITH DIFFERENTIAL (CANCER CENTER ONLY)
Abs Immature Granulocytes: 0.03 10*3/uL (ref 0.00–0.07)
Basophils Absolute: 0.1 10*3/uL (ref 0.0–0.1)
Basophils Relative: 1 %
Eosinophils Absolute: 0.2 10*3/uL (ref 0.0–0.5)
Eosinophils Relative: 3 %
HCT: 33.4 % — ABNORMAL LOW (ref 36.0–46.0)
Hemoglobin: 11.5 g/dL — ABNORMAL LOW (ref 12.0–15.0)
Immature Granulocytes: 1 %
Lymphocytes Relative: 40 %
Lymphs Abs: 2.3 10*3/uL (ref 0.7–4.0)
MCH: 32.3 pg (ref 26.0–34.0)
MCHC: 34.4 g/dL (ref 30.0–36.0)
MCV: 93.8 fL (ref 80.0–100.0)
Monocytes Absolute: 1.3 10*3/uL — ABNORMAL HIGH (ref 0.1–1.0)
Monocytes Relative: 23 %
Neutro Abs: 1.8 10*3/uL (ref 1.7–7.7)
Neutrophils Relative %: 32 %
Platelet Count: 457 10*3/uL — ABNORMAL HIGH (ref 150–400)
RBC: 3.56 MIL/uL — ABNORMAL LOW (ref 3.87–5.11)
RDW: 13.2 % (ref 11.5–15.5)
WBC Count: 5.6 10*3/uL (ref 4.0–10.5)
nRBC: 0 % (ref 0.0–0.2)

## 2020-11-19 LAB — CMP (CANCER CENTER ONLY)
ALT: 11 U/L (ref 0–44)
AST: 21 U/L (ref 15–41)
Albumin: 3.6 g/dL (ref 3.5–5.0)
Alkaline Phosphatase: 84 U/L (ref 38–126)
Anion gap: 10 (ref 5–15)
BUN: 17 mg/dL (ref 8–23)
CO2: 24 mmol/L (ref 22–32)
Calcium: 8.8 mg/dL — ABNORMAL LOW (ref 8.9–10.3)
Chloride: 107 mmol/L (ref 98–111)
Creatinine: 0.75 mg/dL (ref 0.44–1.00)
GFR, Estimated: >60 mL/min (ref >60)
Glucose, Bld: 80 mg/dL (ref 70–99)
Potassium: 4.1 mmol/L (ref 3.5–5.1)
Sodium: 141 mmol/L (ref 135–145)
Total Bilirubin: 0.2 mg/dL — ABNORMAL LOW (ref 0.3–1.2)
Total Protein: 6.7 g/dL (ref 6.5–8.1)

## 2020-11-19 MED ORDER — SODIUM CHLORIDE 0.9 % IV SOLN
Freq: Once | INTRAVENOUS | Status: AC
  Filled 2020-11-19: qty 250

## 2020-11-19 MED ORDER — SODIUM CHLORIDE 0.9 % IV SOLN
150.0000 mg | Freq: Once | INTRAVENOUS | Status: AC
  Administered 2020-11-19: 150 mg via INTRAVENOUS
  Filled 2020-11-19: qty 150

## 2020-11-19 MED ORDER — POTASSIUM CHLORIDE IN NACL 20-0.9 MEQ/L-% IV SOLN
Freq: Once | INTRAVENOUS | Status: AC
  Filled 2020-11-19: qty 1000

## 2020-11-19 MED ORDER — SODIUM CHLORIDE 0.9 % IV SOLN
10.0000 mg | Freq: Once | INTRAVENOUS | Status: AC
  Administered 2020-11-19: 10 mg via INTRAVENOUS
  Filled 2020-11-19: qty 10

## 2020-11-19 MED ORDER — MAGNESIUM SULFATE 2 GM/50ML IV SOLN
INTRAVENOUS | Status: AC
  Filled 2020-11-19: qty 50

## 2020-11-19 MED ORDER — PALONOSETRON HCL INJECTION 0.25 MG/5ML
0.2500 mg | Freq: Once | INTRAVENOUS | Status: AC
  Administered 2020-11-19: 0.25 mg via INTRAVENOUS

## 2020-11-19 MED ORDER — MAGNESIUM SULFATE 2 GM/50ML IV SOLN
2.0000 g | Freq: Once | INTRAVENOUS | Status: AC
  Administered 2020-11-19: 2 g via INTRAVENOUS

## 2020-11-19 MED ORDER — PALONOSETRON HCL INJECTION 0.25 MG/5ML
INTRAVENOUS | Status: AC
  Filled 2020-11-19: qty 5

## 2020-11-19 MED ORDER — SODIUM CHLORIDE 0.9% FLUSH
10.0000 mL | INTRAVENOUS | Status: DC | PRN
  Administered 2020-11-19: 10 mL
  Filled 2020-11-19: qty 10

## 2020-11-19 MED ORDER — SODIUM CHLORIDE 0.9 % IV SOLN
70.0000 mg/m2 | Freq: Once | INTRAVENOUS | Status: AC
  Administered 2020-11-19: 107 mg via INTRAVENOUS
  Filled 2020-11-19: qty 107

## 2020-11-19 MED ORDER — SODIUM CHLORIDE 0.9% FLUSH
10.0000 mL | INTRAVENOUS | Status: DC | PRN
  Administered 2020-11-19: 10 mL via INTRAVENOUS
  Filled 2020-11-19: qty 10

## 2020-11-19 MED ORDER — HEPARIN SOD (PORK) LOCK FLUSH 100 UNIT/ML IV SOLN
500.0000 [IU] | Freq: Once | INTRAVENOUS | Status: AC | PRN
  Administered 2020-11-19: 500 [IU]
  Filled 2020-11-19: qty 5

## 2020-11-19 MED ORDER — SODIUM CHLORIDE 0.9 % IV SOLN
1000.0000 mg/m2 | Freq: Once | INTRAVENOUS | Status: AC
  Administered 2020-11-19: 1520 mg via INTRAVENOUS
  Filled 2020-11-19: qty 39.98

## 2020-11-19 NOTE — Progress Notes (Signed)
Hematology and Oncology Follow Up Visit  Sandra Mann [id number] 12-Aug-1951 70 y.o. 11/19/2020 8:06 AM Sandra Mann, MDEksir, [name], MD   Principle Diagnosis: 70 year old woman with tumor of the urethra diagnosed in November 2021.  She was found to have large pelvic tumor and T4N0 high-grade urothelial carcinoma with squamous differentiation.  Prior Therapy: She is status post cystoscopy and biopsy in November 2021 which showed high-grade urothelial carcinoma.   Current therapy: Neoadjuvant chemotherapy utilizing gemcitabine and cisplatin started on October 29, 2020.  She is here for day 1 of cycle 2 of therapy.   Interim History: [name] is here for return evaluation.  Since the last visit, she reports no Mann changes in her health.  He has tolerated chemotherapy without any Mann complaints.  He reports pelvic discomfort that is intermittent and not associated with any nausea, vomiting or hematuria.  She denies any dysuria or frequency.  She has a indwelling catheter in place.  Her performance status quality of life remain excellent.  She has not reported any residual complaints.    Medications: Unchanged on review. Current Outpatient Medications  Medication Sig Dispense Refill  . cycloSPORINE (RESTASIS) 0.05 % ophthalmic emulsion Place 1 drop into both eyes 2 (two) times daily.    . Estradiol 10 MCG TABS vaginal tablet Place 10 mcg vaginally daily.    . folic acid (FOLVITE) 431 MCG tablet Take 1,600 mcg by mouth daily.     Marland Kitchen lidocaine-prilocaine (EMLA) cream Apply 1 application topically as needed. 30 g 0  . mesalamine (CANASA) 1000 MG suppository Place 1,000 mg rectally every other day.     . methotrexate (RHEUMATREX) 2.5 MG tablet Take 20 mg by mouth once a week.     . Multiple Vitamin (MULTIVITAMIN WITH MINERALS) TABS tablet Take 1 tablet by mouth daily.    Marland Kitchen omeprazole (PRILOSEC) 40 MG capsule Take 40 mg by mouth in the morning.     . prochlorperazine (COMPAZINE)  10 MG tablet Take 1 tablet (10 mg total) by mouth every 6 (six) hours as needed for nausea or vomiting. 30 tablet 0   No current facility-administered medications for this visit.   Facility-Administered Medications Ordered in Other Visits  Medication Dose Route Frequency Provider Last Rate Last Admin  . sodium chloride flush (NS) 0.9 % injection 10 mL  10 mL Intravenous PRN Wyatt Portela, MD   10 mL at 11/19/20 0758     Allergies:  Allergies  Allergen Reactions  . Codeine Nausea And Vomiting  . Sulfa Antibiotics Nausea And Vomiting  . Vicodin [Hydrocodone-Acetaminophen] Nausea And Vomiting      Physical Exam: Blood pressure (!) 148/73, pulse 88, temperature 97.8 F (36.6 C), temperature source Tympanic, resp. rate 18, height 5' 3"  (1.6 m), weight 125 lb 4.8 oz (56.8 kg), SpO2 99 %.    ECOG: 0    General appearance: Alert, awake without any distress. Head: Atraumatic without abnormalities Oropharynx: Without any thrush or ulcers. Eyes: No scleral icterus. Lymph nodes: No lymphadenopathy noted in the cervical, supraclavicular, or axillary nodes Heart:regular rate and rhythm, without any murmurs or gallops.   Lung: Clear to auscultation without any rhonchi, wheezes or dullness to percussion. Abdomin: Soft, nontender without any shifting dullness or ascites. Musculoskeletal: No clubbing or cyanosis. Neurological: No motor or sensory deficits. Skin: No rashes or lesions.      Lab Results: Lab Results  Component Value Date   WBC 8.0 11/05/2020   HGB 12.4 11/05/2020  HCT 35.0 (L) 11/05/2020   MCV 92.8 11/05/2020   PLT 190 11/05/2020     Chemistry      Component Value Date/Time   NA 135 11/05/2020 0752   K 4.0 11/05/2020 0752   CL 102 11/05/2020 0752   CO2 25 11/05/2020 0752   BUN 17 11/05/2020 0752   CREATININE 0.84 11/05/2020 0752      Component Value Date/Time   CALCIUM 9.2 11/05/2020 0752   ALKPHOS 84 11/05/2020 0752   AST 24 11/05/2020 0752   ALT  21 11/05/2020 0752   BILITOT 0.7 11/05/2020 0752        Impression and Plan:  70 year old woman with:  1.    Cancer of the urethra diagnosed in November 2021.  She was found to have T4N0 high-grade urothelial carcinoma with squamous differentiation.  She completed the first cycle of chemotherapy utilizing gemcitabine and cisplatin without any Mann complications.  Risks and benefits of proceeding with cycle 2 of therapy were reviewed.  Potential complications that include nausea, vomiting, suppression, neutropenia possible sepsis.  The plan is to proceed with 3-4 cycles of therapy and repeat imaging studies after that.  Laboratory data were reviewed today and appears adequate and no dose adjustment needed.  She is agreeable to proceed at this time.  2. IV access:Port-A-Cath continues to be in use without any issues.  3. Antiemetics: Compazine is available to her without any nausea or vomiting.  4. Renal function surveillance:Kidney function remained normal on platinum based therapy.  5. Goals of care:  Therapy remains aggressive at this time with curative intent.  6.  Constipation: Improved and currently taking Colace.  7. Follow-up: She will return in 1 week for day 8 of cycle 2 and in 3 weeks for the start of cycle 3.  30  minutes were dedicated to this visit.  The time was spent on reviewing her disease status, laboratory data review and outlining future plan of care.   [name], MD 1/19/20228:06 AM

## 2020-11-19 NOTE — Patient Instructions (Signed)
[facility] Discharge Instructions for Patients Receiving Chemotherapy  Today you received the following chemotherapy agents Gemzar; Cisplatin  To help prevent nausea and vomiting after your treatment, we encourage you to take your nausea medication as directed   If you develop nausea and vomiting that is not controlled by your nausea medication, call the clinic.   BELOW ARE SYMPTOMS THAT SHOULD BE REPORTED IMMEDIATELY:  *FEVER GREATER THAN 100.5 F  *CHILLS WITH OR WITHOUT FEVER  NAUSEA AND VOMITING THAT IS NOT CONTROLLED WITH YOUR NAUSEA MEDICATION  *UNUSUAL SHORTNESS OF BREATH  *UNUSUAL BRUISING OR BLEEDING  TENDERNESS IN MOUTH AND THROAT WITH OR WITHOUT PRESENCE OF ULCERS  *URINARY PROBLEMS  *BOWEL PROBLEMS  UNUSUAL RASH Items with * indicate a potential emergency and should be followed up as soon as possible.  Feel free to call the clinic should you have any questions or concerns. The clinic phone number is [phone].  Please show the [facility] at check-in to the Emergency Department and triage nurse.

## 2020-11-25 ENCOUNTER — Other Ambulatory Visit: Payer: Self-pay | Admitting: Oncology

## 2020-11-25 DIAGNOSIS — C680 Malignant neoplasm of urethra: Secondary | ICD-10-CM

## 2020-11-26 ENCOUNTER — Inpatient Hospital Stay: Payer: Medicare Other

## 2020-11-26 ENCOUNTER — Other Ambulatory Visit: Payer: Self-pay | Admitting: Oncology

## 2020-11-26 ENCOUNTER — Other Ambulatory Visit: Payer: Self-pay

## 2020-11-26 VITALS — BP 122/65 | HR 63 | Temp 98.4°F | Resp 18

## 2020-11-26 DIAGNOSIS — Z95828 Presence of other vascular implants and grafts: Secondary | ICD-10-CM

## 2020-11-26 DIAGNOSIS — C680 Malignant neoplasm of urethra: Secondary | ICD-10-CM

## 2020-11-26 DIAGNOSIS — Z5111 Encounter for antineoplastic chemotherapy: Secondary | ICD-10-CM | POA: Diagnosis not present

## 2020-11-26 LAB — CMP (CANCER CENTER ONLY)
ALT: 21 U/L (ref 0–44)
AST: 28 U/L (ref 15–41)
Albumin: 3.7 g/dL (ref 3.5–5.0)
Alkaline Phosphatase: 77 U/L (ref 38–126)
Anion gap: 7 (ref 5–15)
BUN: 13 mg/dL (ref 8–23)
CO2: 26 mmol/L (ref 22–32)
Calcium: 8.8 mg/dL — ABNORMAL LOW (ref 8.9–10.3)
Chloride: 103 mmol/L (ref 98–111)
Creatinine: 0.74 mg/dL (ref 0.44–1.00)
GFR, Estimated: >60 mL/min (ref >60)
Glucose, Bld: 87 mg/dL (ref 70–99)
Potassium: 3.8 mmol/L (ref 3.5–5.1)
Sodium: 136 mmol/L (ref 135–145)
Total Bilirubin: 0.4 mg/dL (ref 0.3–1.2)
Total Protein: 6.5 g/dL (ref 6.5–8.1)

## 2020-11-26 LAB — CBC WITH DIFFERENTIAL (CANCER CENTER ONLY)
Abs Immature Granulocytes: 0.04 10*3/uL (ref 0.00–0.07)
Basophils Absolute: 0.1 10*3/uL (ref 0.0–0.1)
Basophils Relative: 1 %
Eosinophils Absolute: 0 10*3/uL (ref 0.0–0.5)
Eosinophils Relative: 1 %
HCT: 30.4 % — ABNORMAL LOW (ref 36.0–46.0)
Hemoglobin: 10.8 g/dL — ABNORMAL LOW (ref 12.0–15.0)
Immature Granulocytes: 1 %
Lymphocytes Relative: 40 %
Lymphs Abs: 2.2 10*3/uL (ref 0.7–4.0)
MCH: 32.6 pg (ref 26.0–34.0)
MCHC: 35.5 g/dL (ref 30.0–36.0)
MCV: 91.8 fL (ref 80.0–100.0)
Monocytes Absolute: 0.6 10*3/uL (ref 0.1–1.0)
Monocytes Relative: 11 %
Neutro Abs: 2.5 10*3/uL (ref 1.7–7.7)
Neutrophils Relative %: 46 %
Platelet Count: 304 10*3/uL (ref 150–400)
RBC: 3.31 MIL/uL — ABNORMAL LOW (ref 3.87–5.11)
RDW: 12.8 % (ref 11.5–15.5)
WBC Count: 5.4 10*3/uL (ref 4.0–10.5)
nRBC: 0 % (ref 0.0–0.2)

## 2020-11-26 MED ORDER — SODIUM CHLORIDE 0.9 % IV SOLN
Freq: Once | INTRAVENOUS | Status: AC
  Filled 2020-11-26: qty 250

## 2020-11-26 MED ORDER — HEPARIN SOD (PORK) LOCK FLUSH 100 UNIT/ML IV SOLN
500.0000 [IU] | Freq: Once | INTRAVENOUS | Status: AC | PRN
  Administered 2020-11-26: 500 [IU]
  Filled 2020-11-26: qty 5

## 2020-11-26 MED ORDER — SODIUM CHLORIDE 0.9% FLUSH
10.0000 mL | INTRAVENOUS | Status: DC | PRN
  Administered 2020-11-26: 10 mL
  Filled 2020-11-26: qty 10

## 2020-11-26 MED ORDER — SODIUM CHLORIDE 0.9% FLUSH
10.0000 mL | INTRAVENOUS | Status: DC | PRN
  Administered 2020-11-26: 10 mL via INTRAVENOUS
  Filled 2020-11-26: qty 10

## 2020-11-26 MED ORDER — MILK OF MAGNESIA 400 MG/5ML PO SUSP: 5.0000 mL | Freq: Every day | ORAL | 0 refills | Status: DC | PRN

## 2020-11-26 MED ORDER — PROCHLORPERAZINE MALEATE 10 MG PO TABS
ORAL_TABLET | ORAL | Status: AC
  Filled 2020-11-26: qty 1

## 2020-11-26 MED ORDER — SODIUM CHLORIDE 0.9 % IV SOLN
1000.0000 mg/m2 | Freq: Once | INTRAVENOUS | Status: AC
  Administered 2020-11-26: 1520 mg via INTRAVENOUS
  Filled 2020-11-26: qty 39.98

## 2020-11-26 MED ORDER — PROCHLORPERAZINE MALEATE 10 MG PO TABS
10.0000 mg | ORAL_TABLET | Freq: Once | ORAL | Status: AC
  Administered 2020-11-26: 10 mg via ORAL

## 2020-11-26 MED ORDER — SENNOSIDES-DOCUSATE SODIUM 8.6-50 MG PO TABS: 1.0000 | ORAL_TABLET | Freq: Two times a day (BID) | ORAL | 3 refills | Status: DC

## 2020-11-26 NOTE — Patient Instructions (Signed)
[facility] Discharge Instructions for Patients Receiving Chemotherapy  Today you received the following chemotherapy agents gemzar  To help prevent nausea and vomiting after your treatment, we encourage you to take your nausea medication as directed.  If you develop nausea and vomiting that is not controlled by your nausea medication, call the clinic.   BELOW ARE SYMPTOMS THAT SHOULD BE REPORTED IMMEDIATELY:  *FEVER GREATER THAN 100.5 F  *CHILLS WITH OR WITHOUT FEVER  NAUSEA AND VOMITING THAT IS NOT CONTROLLED WITH YOUR NAUSEA MEDICATION  *UNUSUAL SHORTNESS OF BREATH  *UNUSUAL BRUISING OR BLEEDING  TENDERNESS IN MOUTH AND THROAT WITH OR WITHOUT PRESENCE OF ULCERS  *URINARY PROBLEMS  *BOWEL PROBLEMS  UNUSUAL RASH Items with * indicate a potential emergency and should be followed up as soon as possible.  Feel free to call the clinic you have any questions or concerns. The clinic phone number is [phone].  

## 2020-12-10 ENCOUNTER — Other Ambulatory Visit: Payer: Self-pay

## 2020-12-10 ENCOUNTER — Inpatient Hospital Stay: Payer: Medicare Other | Attending: Oncology

## 2020-12-10 ENCOUNTER — Inpatient Hospital Stay (HOSPITAL_BASED_OUTPATIENT_CLINIC_OR_DEPARTMENT_OTHER): Payer: Medicare Other | Admitting: Oncology

## 2020-12-10 ENCOUNTER — Inpatient Hospital Stay: Payer: Medicare Other

## 2020-12-10 VITALS — BP 124/76 | HR 59 | Temp 97.6°F | Resp 18 | Ht 63.0 in | Wt 123.5 lb

## 2020-12-10 DIAGNOSIS — Z5111 Encounter for antineoplastic chemotherapy: Secondary | ICD-10-CM | POA: Diagnosis not present

## 2020-12-10 DIAGNOSIS — Z95828 Presence of other vascular implants and grafts: Secondary | ICD-10-CM | POA: Diagnosis not present

## 2020-12-10 DIAGNOSIS — C680 Malignant neoplasm of urethra: Secondary | ICD-10-CM

## 2020-12-10 LAB — CBC WITH DIFFERENTIAL (CANCER CENTER ONLY)
Abs Immature Granulocytes: 0.02 10*3/uL (ref 0.00–0.07)
Basophils Absolute: 0 10*3/uL (ref 0.0–0.1)
Basophils Relative: 1 %
Eosinophils Absolute: 0.1 10*3/uL (ref 0.0–0.5)
Eosinophils Relative: 2 %
HCT: 32.3 % — ABNORMAL LOW (ref 36.0–46.0)
Hemoglobin: 11.2 g/dL — ABNORMAL LOW (ref 12.0–15.0)
Immature Granulocytes: 0 %
Lymphocytes Relative: 34 %
Lymphs Abs: 1.7 10*3/uL (ref 0.7–4.0)
MCH: 32.6 pg (ref 26.0–34.0)
MCHC: 34.7 g/dL (ref 30.0–36.0)
MCV: 93.9 fL (ref 80.0–100.0)
Monocytes Absolute: 1.2 10*3/uL — ABNORMAL HIGH (ref 0.1–1.0)
Monocytes Relative: 24 %
Neutro Abs: 2 10*3/uL (ref 1.7–7.7)
Neutrophils Relative %: 39 %
Platelet Count: 233 10*3/uL (ref 150–400)
RBC: 3.44 MIL/uL — ABNORMAL LOW (ref 3.87–5.11)
RDW: 14.4 % (ref 11.5–15.5)
WBC Count: 5.1 10*3/uL (ref 4.0–10.5)
nRBC: 0 % (ref 0.0–0.2)

## 2020-12-10 LAB — CMP (CANCER CENTER ONLY)
ALT: 14 U/L (ref 0–44)
AST: 24 U/L (ref 15–41)
Albumin: 3.8 g/dL (ref 3.5–5.0)
Alkaline Phosphatase: 85 U/L (ref 38–126)
Anion gap: 7 (ref 5–15)
BUN: 12 mg/dL (ref 8–23)
CO2: 25 mmol/L (ref 22–32)
Calcium: 8.9 mg/dL (ref 8.9–10.3)
Chloride: 105 mmol/L (ref 98–111)
Creatinine: 0.69 mg/dL (ref 0.44–1.00)
GFR, Estimated: >60 mL/min (ref >60)
Glucose, Bld: 81 mg/dL (ref 70–99)
Potassium: 4.3 mmol/L (ref 3.5–5.1)
Sodium: 137 mmol/L (ref 135–145)
Total Bilirubin: 0.5 mg/dL (ref 0.3–1.2)
Total Protein: 6.5 g/dL (ref 6.5–8.1)

## 2020-12-10 MED ORDER — MAGNESIUM SULFATE 2 GM/50ML IV SOLN
2.0000 g | Freq: Once | INTRAVENOUS | Status: AC
  Administered 2020-12-10: 2 g via INTRAVENOUS

## 2020-12-10 MED ORDER — SODIUM CHLORIDE 0.9 % IV SOLN
10.0000 mg | Freq: Once | INTRAVENOUS | Status: AC
  Administered 2020-12-10: 10 mg via INTRAVENOUS
  Filled 2020-12-10: qty 10

## 2020-12-10 MED ORDER — SODIUM CHLORIDE 0.9% FLUSH
10.0000 mL | INTRAVENOUS | Status: DC | PRN
  Administered 2020-12-10: 10 mL via INTRAVENOUS
  Filled 2020-12-10: qty 10

## 2020-12-10 MED ORDER — SODIUM CHLORIDE 0.9% FLUSH
10.0000 mL | INTRAVENOUS | Status: DC | PRN
  Administered 2020-12-10: 10 mL
  Filled 2020-12-10: qty 10

## 2020-12-10 MED ORDER — GEMCITABINE HCL CHEMO INJECTION 1 GM/26.3ML
1000.0000 mg/m2 | Freq: Once | INTRAVENOUS | Status: AC
  Administered 2020-12-10: 1520 mg via INTRAVENOUS
  Filled 2020-12-10: qty 39.98

## 2020-12-10 MED ORDER — HEPARIN SOD (PORK) LOCK FLUSH 100 UNIT/ML IV SOLN
500.0000 [IU] | Freq: Once | INTRAVENOUS | Status: AC | PRN
  Administered 2020-12-10: 500 [IU]
  Filled 2020-12-10: qty 5

## 2020-12-10 MED ORDER — PALONOSETRON HCL INJECTION 0.25 MG/5ML
INTRAVENOUS | Status: AC
  Filled 2020-12-10: qty 5

## 2020-12-10 MED ORDER — PALONOSETRON HCL INJECTION 0.25 MG/5ML
0.2500 mg | Freq: Once | INTRAVENOUS | Status: AC
  Administered 2020-12-10: 0.25 mg via INTRAVENOUS

## 2020-12-10 MED ORDER — POTASSIUM CHLORIDE IN NACL 20-0.9 MEQ/L-% IV SOLN
Freq: Once | INTRAVENOUS | Status: AC
  Filled 2020-12-10: qty 1000

## 2020-12-10 MED ORDER — SODIUM CHLORIDE 0.9 % IV SOLN
70.0000 mg/m2 | Freq: Once | INTRAVENOUS | Status: AC
  Administered 2020-12-10: 107 mg via INTRAVENOUS
  Filled 2020-12-10: qty 107

## 2020-12-10 MED ORDER — SODIUM CHLORIDE 0.9 % IV SOLN
150.0000 mg | Freq: Once | INTRAVENOUS | Status: AC
  Administered 2020-12-10: 150 mg via INTRAVENOUS
  Filled 2020-12-10: qty 150

## 2020-12-10 MED ORDER — SODIUM CHLORIDE 0.9 % IV SOLN
Freq: Once | INTRAVENOUS | Status: AC
  Filled 2020-12-10: qty 250

## 2020-12-10 NOTE — Patient Instructions (Signed)
[facility] Discharge Instructions for Patients Receiving Chemotherapy  Today you received the following chemotherapy agents Gemzar; Cisplatin  To help prevent nausea and vomiting after your treatment, we encourage you to take your nausea medication as directed If you develop nausea and vomiting that is not controlled by your nausea medication, call the clinic.   BELOW ARE SYMPTOMS THAT SHOULD BE REPORTED IMMEDIATELY:  *FEVER GREATER THAN 100.5 F  *CHILLS WITH OR WITHOUT FEVER  NAUSEA AND VOMITING THAT IS NOT CONTROLLED WITH YOUR NAUSEA MEDICATION  *UNUSUAL SHORTNESS OF BREATH  *UNUSUAL BRUISING OR BLEEDING  TENDERNESS IN MOUTH AND THROAT WITH OR WITHOUT PRESENCE OF ULCERS  *URINARY PROBLEMS  *BOWEL PROBLEMS  UNUSUAL RASH Items with * indicate a potential emergency and should be followed up as soon as possible.  Feel free to call the clinic should you have any questions or concerns. The clinic phone number is [phone].  Please show the [facility] at check-in to the Emergency Department and triage nurse.

## 2020-12-10 NOTE — Progress Notes (Signed)
Hematology and Oncology Follow Up Visit  ROSABEL SERMENO [id number] Jan 08, 1951 70 y.o. 12/10/2020 8:25 AM [name], MDEksir, [name], MD   Principle Diagnosis: 70 year old woman with carcinoma of the urethra diagnosed in November 2021.  She was found to have T4N0 high-grade urothelial carcinoma with squamous differentiation after presenting with pelvic mass.  Prior Therapy: She is status post cystoscopy and biopsy in November 2021 which showed high-grade urothelial carcinoma.   Current therapy: Neoadjuvant chemotherapy utilizing gemcitabine and cisplatin started on October 29, 2020.  He is here for a day 1 of cycle 3 of therapy.   Interim History: Ms. Scalia presents today for repeat follow-up.  Since her last visit, she reports no major changes in in her health. She continues to have an indwelling Foley catheter with leakage around it. Her bladder spasms has improved with medication and as she was prescribed antibiotics from Dr. Claudia Desanctis after urinary tract infection. She denies any fevers chills sweats. She denies any nausea or abdominal pain. She denies any worsening neuropathy.   Medications: Updated on review. Current Outpatient Medications  Medication Sig Dispense Refill  . cycloSPORINE (RESTASIS) 0.05 % ophthalmic emulsion Place 1 drop into both eyes 2 (two) times daily.    . Estradiol 10 MCG TABS vaginal tablet Place 10 mcg vaginally daily.    . folic acid (FOLVITE) 287 MCG tablet Take 1,600 mcg by mouth daily.     Marland Kitchen lidocaine-prilocaine (EMLA) cream Apply 1 application topically as needed. 30 g 0  . magnesium hydroxide (MILK OF MAGNESIA) 400 MG/5ML suspension Take 5 mLs by mouth daily as needed for mild constipation. 355 mL 0  . mesalamine (CANASA) 1000 MG suppository Place 1,000 mg rectally every other day.     . methotrexate (RHEUMATREX) 2.5 MG tablet Take 20 mg by mouth once a week.     . Multiple Vitamin (MULTIVITAMIN WITH MINERALS) TABS tablet Take 1 tablet by mouth  daily.    Marland Kitchen omeprazole (PRILOSEC) 40 MG capsule Take 40 mg by mouth in the morning.     . prochlorperazine (COMPAZINE) 10 MG tablet Take 1 tablet (10 mg total) by mouth every 6 (six) hours as needed for nausea or vomiting. 30 tablet 0  . senna-docusate (SENNA S) 8.6-50 MG tablet Take 1 tablet by mouth 2 (two) times daily. 60 tablet 3   No current facility-administered medications for this visit.     Allergies:  Allergies  Allergen Reactions  . Codeine Nausea And Vomiting  . Sulfa Antibiotics Nausea And Vomiting  . Vicodin [Hydrocodone-Acetaminophen] Nausea And Vomiting      Physical Exam: Blood pressure 124/76, pulse (!) 59, temperature 97.6 F (36.4 C), temperature source Tympanic, resp. rate 18, height 5' 3"  (1.6 m), weight 123 lb 8 oz (56 kg), SpO2 100 %.    ECOG:     General appearance: Alert, awake without any distress. Head: Atraumatic without abnormalities Oropharynx: Without any thrush or ulcers. Eyes: No scleral icterus. Lymph nodes: No lymphadenopathy noted in the cervical, supraclavicular, or axillary nodes Heart:regular rate and rhythm, without any murmurs or gallops.   Lung: Clear to auscultation without any rhonchi, wheezes or dullness to percussion. Abdomin: Soft, nontender without any shifting dullness or ascites. Musculoskeletal: No clubbing or cyanosis. Neurological: No motor or sensory deficits. Skin: No rashes or lesions.     Lab Results: Lab Results  Component Value Date   WBC 5.4 11/26/2020   HGB 10.8 (L) 11/26/2020   HCT 30.4 (L) 11/26/2020   MCV  91.8 11/26/2020   PLT 304 11/26/2020     Chemistry      Component Value Date/Time   NA 136 11/26/2020 1042   K 3.8 11/26/2020 1042   CL 103 11/26/2020 1042   CO2 26 11/26/2020 1042   BUN 13 11/26/2020 1042   CREATININE 0.74 11/26/2020 1042      Component Value Date/Time   CALCIUM 8.8 (L) 11/26/2020 1042   ALKPHOS 77 11/26/2020 1042   AST 28 11/26/2020 1042   ALT 21 11/26/2020 1042    BILITOT 0.4 11/26/2020 1042        Impression and Plan:  70 year old woman with:  1.    Carcinoma of the urethra diagnosed in November 2021.  She presented with pelvic mass and T4N0 high-grade urothelial carcinoma with squamous differentiation.  The natural course of her disease was reviewed at this time and treatment options were reiterated.  She is receiving neoadjuvant chemotherapy with gemcitabine and cisplatin without any major complications.  Risks and benefits of continuing this treatment were discussed at this time.  Complications that include nausea, vomiting, myelosuppression, neutropenia possible sepsis were reiterated.  Repeat imaging studies will be done upon completing cycle 4 of therapy to assess response and the duration of therapy accordingly. She is agreeable to proceed at this time.  2. IV access:Port-A-Cath remains in use without any issues.  3. Antiemetics: Compazine is available to her without any nausea or vomiting.  4. Renal function surveillance:Kidney function remained stable on platinum therapy.  We will continue to monitor.  5. Goals of care:  Her disease is potentially curable although locally advanced.  Aggressive measures are warranted.  6.  Constipation: Her bowels are moving better currently.  7. Follow-up: Will be in 1 week to complete cycle 3 and 3 weeks for the start of cycle 4.  60  minutes were dedicated to this visit.  Time spent on reviewing disease status, treatment options and complications of therapy  [name], MD 2/9/20228:25 AM

## 2020-12-10 NOTE — Patient Instructions (Signed)
Implanted Port Insertion, Care After This sheet gives you information about how to care for yourself after your procedure. Your health care provider may also give you more specific instructions. If you have problems or questions, contact your health care provider. What can I expect after the procedure? After the procedure, it is common to have:  Discomfort at the port insertion site.  Bruising on the skin over the port. This should improve over 3-4 days. Follow these instructions at home: Port care  After your port is placed, you will get a manufacturer's information card. The card has information about your port. Keep this card with you at all times.  Take care of the port as told by your health care provider. Ask your health care provider if you or a family member can get training for taking care of the port at home. A home health care nurse may also take care of the port.  Make sure to remember what type of port you have. Incision care  Follow instructions from your health care provider about how to take care of your port insertion site. Make sure you: ? Wash your hands with soap and water before and after you change your bandage (dressing). If soap and water are not available, use hand sanitizer. ? Change your dressing as told by your health care provider. ? Leave stitches (sutures), skin glue, or adhesive strips in place. These skin closures may need to stay in place for 2 weeks or longer. If adhesive strip edges start to loosen and curl up, you may trim the loose edges. Do not remove adhesive strips completely unless your health care provider tells you to do that.  Check your port insertion site every day for signs of infection. Check for: ? Redness, swelling, or pain. ? Fluid or blood. ? Warmth. ? Pus or a bad smell.      Activity  Return to your normal activities as told by your health care provider. Ask your health care provider what activities are safe for you.  Do not  lift anything that is heavier than 10 lb (4.5 kg), or the limit that you are told, until your health care provider says that it is safe. General instructions  Take over-the-counter and prescription medicines only as told by your health care provider.  Do not take baths, swim, or use a hot tub until your health care provider approves. Ask your health care provider if you may take showers. You may only be allowed to take sponge baths.  Do not drive for 24 hours if you were given a sedative during your procedure.  Wear a medical alert bracelet in case of an emergency. This will tell any health care providers that you have a port.  Keep all follow-up visits as told by your health care provider. This is important. Contact a health care provider if:  You cannot flush your port with saline as directed, or you cannot draw blood from the port.  You have a fever or chills.  You have redness, swelling, or pain around your port insertion site.  You have fluid or blood coming from your port insertion site.  Your port insertion site feels warm to the touch.  You have pus or a bad smell coming from the port insertion site. Get help right away if:  You have chest pain or shortness of breath.  You have bleeding from your port that you cannot control. Summary  Take care of the port as told by your   health care provider. Keep the manufacturer's information card with you at all times.  Change your dressing as told by your health care provider.  Contact a health care provider if you have a fever or chills or if you have redness, swelling, or pain around your port insertion site.  Keep all follow-up visits as told by your health care provider. This information is not intended to replace advice given to you by your health care provider. Make sure you discuss any questions you have with your health care provider. Document Revised: 05/16/2018 Document Reviewed: 05/16/2018 Elsevier Patient Education   2021 [employer] Inc[employer]  

## 2020-12-15 ENCOUNTER — Encounter: Payer: Self-pay | Admitting: Oncology

## 2020-12-17 ENCOUNTER — Telehealth: Payer: Self-pay | Admitting: *Deleted

## 2020-12-17 ENCOUNTER — Inpatient Hospital Stay (HOSPITAL_BASED_OUTPATIENT_CLINIC_OR_DEPARTMENT_OTHER): Payer: Medicare Other

## 2020-12-17 ENCOUNTER — Inpatient Hospital Stay: Payer: Medicare Other

## 2020-12-17 ENCOUNTER — Other Ambulatory Visit: Payer: Self-pay

## 2020-12-17 VITALS — BP 116/54 | HR 61 | Temp 98.1°F | Resp 18 | Wt 123.8 lb

## 2020-12-17 DIAGNOSIS — Z5111 Encounter for antineoplastic chemotherapy: Secondary | ICD-10-CM | POA: Diagnosis not present

## 2020-12-17 DIAGNOSIS — Z95828 Presence of other vascular implants and grafts: Secondary | ICD-10-CM

## 2020-12-17 DIAGNOSIS — C680 Malignant neoplasm of urethra: Secondary | ICD-10-CM

## 2020-12-17 LAB — CMP (CANCER CENTER ONLY)
ALT: 18 U/L (ref 0–44)
AST: 26 U/L (ref 15–41)
Albumin: 3.5 g/dL (ref 3.5–5.0)
Alkaline Phosphatase: 79 U/L (ref 38–126)
Anion gap: 5 (ref 5–15)
BUN: 16 mg/dL (ref 8–23)
CO2: 25 mmol/L (ref 22–32)
Calcium: 8.5 mg/dL — ABNORMAL LOW (ref 8.9–10.3)
Chloride: 105 mmol/L (ref 98–111)
Creatinine: 0.74 mg/dL (ref 0.44–1.00)
GFR, Estimated: >60 mL/min
Glucose, Bld: 82 mg/dL (ref 70–99)
Potassium: 4 mmol/L (ref 3.5–5.1)
Sodium: 135 mmol/L (ref 135–145)
Total Bilirubin: 0.2 mg/dL — ABNORMAL LOW (ref 0.3–1.2)
Total Protein: 6.2 g/dL — ABNORMAL LOW (ref 6.5–8.1)

## 2020-12-17 LAB — CBC WITH DIFFERENTIAL (CANCER CENTER ONLY)
Abs Immature Granulocytes: 0.04 10*3/uL (ref 0.00–0.07)
Basophils Absolute: 0 10*3/uL (ref 0.0–0.1)
Basophils Relative: 1 %
Eosinophils Absolute: 0 10*3/uL (ref 0.0–0.5)
Eosinophils Relative: 1 %
HCT: 27.2 % — ABNORMAL LOW (ref 36.0–46.0)
Hemoglobin: 9.5 g/dL — ABNORMAL LOW (ref 12.0–15.0)
Immature Granulocytes: 1 %
Lymphocytes Relative: 38 %
Lymphs Abs: 1.9 10*3/uL (ref 0.7–4.0)
MCH: 32.8 pg (ref 26.0–34.0)
MCHC: 34.9 g/dL (ref 30.0–36.0)
MCV: 93.8 fL (ref 80.0–100.0)
Monocytes Absolute: 0.7 10*3/uL (ref 0.1–1.0)
Monocytes Relative: 14 %
Neutro Abs: 2.3 10*3/uL (ref 1.7–7.7)
Neutrophils Relative %: 45 %
Platelet Count: 208 10*3/uL (ref 150–400)
RBC: 2.9 MIL/uL — ABNORMAL LOW (ref 3.87–5.11)
RDW: 13.5 % (ref 11.5–15.5)
WBC Count: 5.1 10*3/uL (ref 4.0–10.5)
nRBC: 0 % (ref 0.0–0.2)

## 2020-12-17 MED ORDER — HEPARIN SOD (PORK) LOCK FLUSH 100 UNIT/ML IV SOLN
500.0000 [IU] | Freq: Once | INTRAVENOUS | Status: AC | PRN
  Administered 2020-12-17: 500 [IU]
  Filled 2020-12-17: qty 5

## 2020-12-17 MED ORDER — PROCHLORPERAZINE MALEATE 10 MG PO TABS
10.0000 mg | ORAL_TABLET | Freq: Once | ORAL | Status: AC
  Administered 2020-12-17: 10 mg via ORAL

## 2020-12-17 MED ORDER — PROCHLORPERAZINE MALEATE 10 MG PO TABS
ORAL_TABLET | ORAL | Status: AC
  Filled 2020-12-17: qty 1

## 2020-12-17 MED ORDER — SODIUM CHLORIDE 0.9% FLUSH
10.0000 mL | INTRAVENOUS | Status: DC | PRN
Start: 2020-12-17 — End: 2020-12-17
  Administered 2020-12-17: 10 mL
  Filled 2020-12-17: qty 10

## 2020-12-17 MED ORDER — SODIUM CHLORIDE 0.9 % IV SOLN
Freq: Once | INTRAVENOUS | Status: AC
  Filled 2020-12-17: qty 250

## 2020-12-17 MED ORDER — SODIUM CHLORIDE 0.9% FLUSH
10.0000 mL | Freq: Once | INTRAVENOUS | Status: AC
  Administered 2020-12-17: 10 mL via INTRAVENOUS
  Filled 2020-12-17: qty 10

## 2020-12-17 MED ORDER — SODIUM CHLORIDE 0.9 % IV SOLN
1000.0000 mg/m2 | Freq: Once | INTRAVENOUS | Status: AC
  Administered 2020-12-17: 1520 mg via INTRAVENOUS
  Filled 2020-12-17: qty 39.98

## 2020-12-17 NOTE — Telephone Encounter (Signed)
Called patient in regards to mychart message about constipation. Patient states that she has since resolved the constipation and started taking her Senna BID instead of Colace and has seen great improvement.  Advised to continue to work through best bowel regimen for her and to avoid over stimulating the bowels with too much Senna.  She stated understanding.  No further questions at this time.

## 2020-12-17 NOTE — Patient Instructions (Signed)
[facility] Discharge Instructions for Patients Receiving Chemotherapy  Today you received the following chemotherapy agents Gemzar  To help prevent nausea and vomiting after your treatment, we encourage you to take your nausea medication as directed.   If you develop nausea and vomiting that is not controlled by your nausea medication, call the clinic.   BELOW ARE SYMPTOMS THAT SHOULD BE REPORTED IMMEDIATELY:  *FEVER GREATER THAN 100.5 F  *CHILLS WITH OR WITHOUT FEVER  NAUSEA AND VOMITING THAT IS NOT CONTROLLED WITH YOUR NAUSEA MEDICATION  *UNUSUAL SHORTNESS OF BREATH  *UNUSUAL BRUISING OR BLEEDING  TENDERNESS IN MOUTH AND THROAT WITH OR WITHOUT PRESENCE OF ULCERS  *URINARY PROBLEMS  *BOWEL PROBLEMS  UNUSUAL RASH Items with * indicate a potential emergency and should be followed up as soon as possible.  Feel free to call the clinic should you have any questions or concerns. The clinic phone number is [phone].  Please show the [facility] at check-in to the Emergency Department and triage nurse.

## 2020-12-18 ENCOUNTER — Telehealth: Payer: Self-pay | Admitting: Oncology

## 2020-12-18 NOTE — Telephone Encounter (Signed)
Called patient regarding upcoming appointments, patient is notified. 

## 2020-12-31 ENCOUNTER — Inpatient Hospital Stay: Payer: Medicare Other

## 2020-12-31 ENCOUNTER — Other Ambulatory Visit: Payer: Self-pay

## 2020-12-31 ENCOUNTER — Inpatient Hospital Stay: Payer: Medicare Other | Attending: Oncology

## 2020-12-31 ENCOUNTER — Inpatient Hospital Stay: Payer: Medicare Other | Admitting: Oncology

## 2020-12-31 VITALS — BP 129/72 | HR 82 | Temp 97.8°F | Resp 13 | Ht 63.0 in | Wt 123.3 lb

## 2020-12-31 DIAGNOSIS — Z5111 Encounter for antineoplastic chemotherapy: Secondary | ICD-10-CM | POA: Diagnosis not present

## 2020-12-31 DIAGNOSIS — C680 Malignant neoplasm of urethra: Secondary | ICD-10-CM

## 2020-12-31 DIAGNOSIS — Z452 Encounter for adjustment and management of vascular access device: Secondary | ICD-10-CM | POA: Insufficient documentation

## 2020-12-31 LAB — CBC WITH DIFFERENTIAL (CANCER CENTER ONLY)
Abs Immature Granulocytes: 0.03 10*3/uL (ref 0.00–0.07)
Basophils Absolute: 0 10*3/uL (ref 0.0–0.1)
Basophils Relative: 1 %
Eosinophils Absolute: 0.1 10*3/uL (ref 0.0–0.5)
Eosinophils Relative: 2 %
HCT: 30.2 % — ABNORMAL LOW (ref 36.0–46.0)
Hemoglobin: 10.4 g/dL — ABNORMAL LOW (ref 12.0–15.0)
Immature Granulocytes: 1 %
Lymphocytes Relative: 40 %
Lymphs Abs: 1.8 10*3/uL (ref 0.7–4.0)
MCH: 33.2 pg (ref 26.0–34.0)
MCHC: 34.4 g/dL (ref 30.0–36.0)
MCV: 96.5 fL (ref 80.0–100.0)
Monocytes Absolute: 1.1 10*3/uL — ABNORMAL HIGH (ref 0.1–1.0)
Monocytes Relative: 25 %
Neutro Abs: 1.4 10*3/uL — ABNORMAL LOW (ref 1.7–7.7)
Neutrophils Relative %: 31 %
Platelet Count: 291 10*3/uL (ref 150–400)
RBC: 3.13 MIL/uL — ABNORMAL LOW (ref 3.87–5.11)
RDW: 15.4 % (ref 11.5–15.5)
WBC Count: 4.5 10*3/uL (ref 4.0–10.5)
nRBC: 0 % (ref 0.0–0.2)

## 2020-12-31 LAB — CMP (CANCER CENTER ONLY)
ALT: 13 U/L (ref 0–44)
AST: 24 U/L (ref 15–41)
Albumin: 3.8 g/dL (ref 3.5–5.0)
Alkaline Phosphatase: 76 U/L (ref 38–126)
Anion gap: 7 (ref 5–15)
BUN: 19 mg/dL (ref 8–23)
CO2: 23 mmol/L (ref 22–32)
Calcium: 8.8 mg/dL — ABNORMAL LOW (ref 8.9–10.3)
Chloride: 106 mmol/L (ref 98–111)
Creatinine: 0.75 mg/dL (ref 0.44–1.00)
GFR, Estimated: >60 mL/min (ref >60)
Glucose, Bld: 88 mg/dL (ref 70–99)
Potassium: 4.1 mmol/L (ref 3.5–5.1)
Sodium: 136 mmol/L (ref 135–145)
Total Bilirubin: 0.5 mg/dL (ref 0.3–1.2)
Total Protein: 6.5 g/dL (ref 6.5–8.1)

## 2020-12-31 MED ORDER — MAGNESIUM SULFATE 2 GM/50ML IV SOLN
2.0000 g | Freq: Once | INTRAVENOUS | Status: AC
  Administered 2020-12-31: 2 g via INTRAVENOUS

## 2020-12-31 MED ORDER — SODIUM CHLORIDE 0.9 % IV SOLN
10.0000 mg | Freq: Once | INTRAVENOUS | Status: AC
Start: 2020-12-31 — End: 2020-12-31
  Administered 2020-12-31: 10 mg via INTRAVENOUS
  Filled 2020-12-31: qty 10

## 2020-12-31 MED ORDER — HEPARIN SOD (PORK) LOCK FLUSH 100 UNIT/ML IV SOLN
500.0000 [IU] | Freq: Once | INTRAVENOUS | Status: AC | PRN
  Administered 2020-12-31: 500 [IU]
  Filled 2020-12-31: qty 5

## 2020-12-31 MED ORDER — SODIUM CHLORIDE 0.9 % IV SOLN
Freq: Once | INTRAVENOUS | Status: AC
  Filled 2020-12-31: qty 250

## 2020-12-31 MED ORDER — POTASSIUM CHLORIDE IN NACL 20-0.9 MEQ/L-% IV SOLN
Freq: Once | INTRAVENOUS | Status: AC
  Filled 2020-12-31: qty 1000

## 2020-12-31 MED ORDER — SODIUM CHLORIDE 0.9 % IV SOLN
70.0000 mg/m2 | Freq: Once | INTRAVENOUS | Status: AC
  Administered 2020-12-31: 107 mg via INTRAVENOUS
  Filled 2020-12-31: qty 107

## 2020-12-31 MED ORDER — MAGNESIUM SULFATE 2 GM/50ML IV SOLN
INTRAVENOUS | Status: AC
  Filled 2020-12-31: qty 50

## 2020-12-31 MED ORDER — SODIUM CHLORIDE 0.9 % IV SOLN
150.0000 mg | Freq: Once | INTRAVENOUS | Status: AC
  Administered 2020-12-31: 150 mg via INTRAVENOUS
  Filled 2020-12-31: qty 150

## 2020-12-31 MED ORDER — SODIUM CHLORIDE 0.9% FLUSH
10.0000 mL | INTRAVENOUS | Status: DC | PRN
  Administered 2020-12-31: 10 mL
  Filled 2020-12-31: qty 10

## 2020-12-31 MED ORDER — PALONOSETRON HCL INJECTION 0.25 MG/5ML
INTRAVENOUS | Status: AC
  Filled 2020-12-31: qty 5

## 2020-12-31 MED ORDER — SODIUM CHLORIDE 0.9 % IV SOLN
1000.0000 mg/m2 | Freq: Once | INTRAVENOUS | Status: AC
  Administered 2020-12-31: 1520 mg via INTRAVENOUS
  Filled 2020-12-31: qty 39.98

## 2020-12-31 MED ORDER — PALONOSETRON HCL INJECTION 0.25 MG/5ML
0.2500 mg | Freq: Once | INTRAVENOUS | Status: AC
  Administered 2020-12-31: 0.25 mg via INTRAVENOUS

## 2020-12-31 NOTE — Progress Notes (Signed)
Per Dr. Alen Blew ok to treat with today's ANC of 1.4

## 2020-12-31 NOTE — Progress Notes (Signed)
Hematology and Oncology Follow Up Visit  Sandra Mann [id number] 1950-12-08 70 y.o. 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   Prior Therapy: She is status post cystoscopy and biopsy in November 2021 which showed high-grade urothelial carcinoma.   Current therapy: Neoadjuvant chemotherapy utilizing gemcitabine and cisplatin started on October 29, 2020.  He is here for a day 1 of cycle 4 of therapy.   Interim History: Sandra Mann is here for return evaluation.  Since the last visit, she reports no major changes in her health.  She continues to have issues with pelvic discomfort occasionally and urinary leakage around her Foley catheter.  She denies any hematuria, dysuria or changes in her performance status.  She did report some mild nausea but no vomiting associated with chemotherapy.  She continues to eat well and has tolerated hemotherapy without any other complaints.   Medications: Unchanged on review. Current Outpatient Medications  Medication Sig Dispense Refill  . cycloSPORINE (RESTASIS) 0.05 % ophthalmic emulsion Place 1 drop into both eyes 2 (two) times daily.    . Estradiol 10 MCG TABS vaginal tablet Place 10 mcg vaginally daily.    . folic acid (FOLVITE) 633 MCG tablet Take 1,600 mcg by mouth daily.     Marland Kitchen lidocaine-prilocaine (EMLA) cream Apply 1 application topically as needed. 30 g 0  . magnesium hydroxide (MILK OF MAGNESIA) 400 MG/5ML suspension Take 5 mLs by mouth daily as needed for mild constipation. 355 mL 0  . mesalamine (CANASA) 1000 MG suppository Place 1,000 mg rectally every other day.     . methotrexate (RHEUMATREX) 2.5 MG tablet Take 20 mg by mouth once a week.     . Multiple Vitamin (MULTIVITAMIN WITH MINERALS) TABS tablet Take 1 tablet by mouth daily.    Marland Kitchen omeprazole (PRILOSEC) 40 MG capsule Take 40 mg by mouth in the  morning.     . prochlorperazine (COMPAZINE) 10 MG tablet Take 1 tablet (10 mg total) by mouth every 6 (six) hours as needed for nausea or vomiting. 30 tablet 0  . senna-docusate (SENNA S) 8.6-50 MG tablet Take 1 tablet by mouth 2 (two) times daily. 60 tablet 3   No current facility-administered medications for this visit.     Allergies:  Allergies  Allergen Reactions  . Codeine Nausea And Vomiting  . Sulfa Antibiotics Nausea And Vomiting  . Vicodin [Hydrocodone-Acetaminophen] Nausea And Vomiting      Physical Exam: Blood pressure 129/72, pulse 82, temperature 97.8 F (36.6 C), temperature source Tympanic, resp. rate 13, height 5\' 3"  (1.6 m), weight 123 lb 4.8 oz (55.9 kg), SpO2 100 %.     ECOG: 0   General appearance: Comfortable appearing without any discomfort Head: Normocephalic without any trauma Oropharynx: Mucous membranes are moist and pink without any thrush or ulcers. Eyes: Pupils are equal and round reactive to light. Lymph nodes: No cervical, supraclavicular, inguinal or axillary lymphadenopathy.   Heart:regular rate and rhythm.  S1 and S2 without leg edema. Lung: Clear without any rhonchi or wheezes.  No dullness to percussion. Abdomin: Soft, nontender, nondistended with good bowel sounds.  No hepatosplenomegaly. Musculoskeletal: No joint deformity or effusion.  Full range of motion noted. Neurological: No deficits noted on motor, sensory and deep tendon reflex exam. Skin: No petechial rash or dryness.  Appeared moist.       Lab Results: Lab Results  Component  Value Date   WBC 5.1 12/17/2020   HGB 9.5 (L) 12/17/2020   HCT 27.2 (L) 12/17/2020   MCV 93.8 12/17/2020   PLT 208 12/17/2020     Chemistry      Component Value Date/Time   NA 135 12/17/2020 1050   K 4.0 12/17/2020 1050   CL 105 12/17/2020 1050   CO2 25 12/17/2020 1050   BUN 16 12/17/2020 1050   CREATININE 0.74 12/17/2020 1050      Component Value Date/Time   CALCIUM 8.5 (L) 12/17/2020  1050   ALKPHOS 79 12/17/2020 1050   AST 26 12/17/2020 1050   ALT 18 12/17/2020 1050   BILITOT 0.2 (L) 12/17/2020 1050        Impression and Plan:  70 year old woman with:  1.    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    She has tolerated the chemotherapy without any major complications at this time.  Risks and benefits of proceeding with cycle 4 of therapy were discussed.  Upon completing this cycle, I will obtain staging scans and determine the best course of action.  Surgical resection versus, radiation therapy versus continuing systemic therapy options will be reviewed.  After discussion today, she is agreeable to proceed with cycle 4 and update his staging scan after that.  2. IV access:Port-A-Cath continues to be in use without any issues.  3. Antiemetics: No nausea or vomiting reported.  Compazine is available to her.  4. Renal function surveillance: Creatinine clearance remains normal on platinum therapy.  We will continue to monitor.  5. Goals of care:  Aggressive measures are warranted as her disease is potentially curable.  6.  Constipation: Bowels are moving more regular at this time.  7. Follow-up: In 1 week To complete cycle 4 and subsequently in the next few weeks for repeat imaging studies.  30  minutes were spent on this encounter.  Time was dedicated to reviewing laboratory data, disease status update and treatment options for the future.  [name], MD 3/2/20227:54 AM

## 2020-12-31 NOTE — Patient Instructions (Signed)
[facility] Discharge Instructions for Patients Receiving Chemotherapy  Today you received the following chemotherapy agents Gemzar; Cisplatin  To help prevent nausea and vomiting after your treatment, we encourage you to take your nausea medication as directed If you develop nausea and vomiting that is not controlled by your nausea medication, call the clinic.   BELOW ARE SYMPTOMS THAT SHOULD BE REPORTED IMMEDIATELY:  *FEVER GREATER THAN 100.5 F  *CHILLS WITH OR WITHOUT FEVER  NAUSEA AND VOMITING THAT IS NOT CONTROLLED WITH YOUR NAUSEA MEDICATION  *UNUSUAL SHORTNESS OF BREATH  *UNUSUAL BRUISING OR BLEEDING  TENDERNESS IN MOUTH AND THROAT WITH OR WITHOUT PRESENCE OF ULCERS  *URINARY PROBLEMS  *BOWEL PROBLEMS  UNUSUAL RASH Items with * indicate a potential emergency and should be followed up as soon as possible.  Feel free to call the clinic should you have any questions or concerns. The clinic phone number is [phone].  Please show the Sandra Mann at check-in to the Emergency Department and triage nurse.

## 2021-01-02 ENCOUNTER — Telehealth: Payer: Self-pay | Admitting: Oncology

## 2021-01-02 NOTE — Telephone Encounter (Signed)
Scheduled per los, patient has been called and notified of March appointments.

## 2021-01-07 ENCOUNTER — Inpatient Hospital Stay: Payer: Medicare Other

## 2021-01-07 ENCOUNTER — Other Ambulatory Visit: Payer: Self-pay

## 2021-01-07 VITALS — BP 137/62 | HR 74 | Temp 97.7°F | Resp 16 | Wt 122.5 lb

## 2021-01-07 DIAGNOSIS — C680 Malignant neoplasm of urethra: Secondary | ICD-10-CM

## 2021-01-07 DIAGNOSIS — Z5111 Encounter for antineoplastic chemotherapy: Secondary | ICD-10-CM | POA: Diagnosis not present

## 2021-01-07 LAB — CBC WITH DIFFERENTIAL (CANCER CENTER ONLY)
Abs Immature Granulocytes: 0.03 10*3/uL (ref 0.00–0.07)
Basophils Absolute: 0.1 10*3/uL (ref 0.0–0.1)
Basophils Relative: 1 %
Eosinophils Absolute: 0 10*3/uL (ref 0.0–0.5)
Eosinophils Relative: 1 %
HCT: 28.6 % — ABNORMAL LOW (ref 36.0–46.0)
Hemoglobin: 10.1 g/dL — ABNORMAL LOW (ref 12.0–15.0)
Immature Granulocytes: 1 %
Lymphocytes Relative: 34 %
Lymphs Abs: 2 10*3/uL (ref 0.7–4.0)
MCH: 33.7 pg (ref 26.0–34.0)
MCHC: 35.3 g/dL (ref 30.0–36.0)
MCV: 95.3 fL (ref 80.0–100.0)
Monocytes Absolute: 0.8 10*3/uL (ref 0.1–1.0)
Monocytes Relative: 13 %
Neutro Abs: 3 10*3/uL (ref 1.7–7.7)
Neutrophils Relative %: 50 %
Platelet Count: 235 10*3/uL (ref 150–400)
RBC: 3 MIL/uL — ABNORMAL LOW (ref 3.87–5.11)
RDW: 14 % (ref 11.5–15.5)
WBC Count: 5.8 10*3/uL (ref 4.0–10.5)
nRBC: 0 % (ref 0.0–0.2)

## 2021-01-07 LAB — CMP (CANCER CENTER ONLY)
ALT: 18 U/L (ref 0–44)
AST: 27 U/L (ref 15–41)
Albumin: 3.9 g/dL (ref 3.5–5.0)
Alkaline Phosphatase: 78 U/L (ref 38–126)
Anion gap: 9 (ref 5–15)
BUN: 14 mg/dL (ref 8–23)
CO2: 24 mmol/L (ref 22–32)
Calcium: 9 mg/dL (ref 8.9–10.3)
Chloride: 103 mmol/L (ref 98–111)
Creatinine: 0.83 mg/dL (ref 0.44–1.00)
GFR, Estimated: >60 mL/min (ref >60)
Glucose, Bld: 84 mg/dL (ref 70–99)
Potassium: 4.5 mmol/L (ref 3.5–5.1)
Sodium: 136 mmol/L (ref 135–145)
Total Bilirubin: 0.4 mg/dL (ref 0.3–1.2)
Total Protein: 6.7 g/dL (ref 6.5–8.1)

## 2021-01-07 MED ORDER — PROCHLORPERAZINE MALEATE 10 MG PO TABS
ORAL_TABLET | ORAL | Status: AC
  Filled 2021-01-07: qty 1

## 2021-01-07 MED ORDER — SODIUM CHLORIDE 0.9 % IV SOLN
1000.0000 mg/m2 | Freq: Once | INTRAVENOUS | Status: AC
  Administered 2021-01-07: 1520 mg via INTRAVENOUS
  Filled 2021-01-07: qty 39.98

## 2021-01-07 MED ORDER — PROCHLORPERAZINE MALEATE 10 MG PO TABS
10.0000 mg | ORAL_TABLET | Freq: Once | ORAL | Status: AC
Start: 2021-01-07 — End: 2021-01-07
  Administered 2021-01-07: 10 mg via ORAL

## 2021-01-07 MED ORDER — SODIUM CHLORIDE 0.9% FLUSH
10.0000 mL | INTRAVENOUS | Status: DC | PRN
  Administered 2021-01-07: 10 mL
  Filled 2021-01-07: qty 10

## 2021-01-07 MED ORDER — SODIUM CHLORIDE 0.9 % IV SOLN
Freq: Once | INTRAVENOUS | Status: AC
Start: 2021-01-07 — End: 2021-01-07
  Filled 2021-01-07: qty 250

## 2021-01-07 MED ORDER — HEPARIN SOD (PORK) LOCK FLUSH 100 UNIT/ML IV SOLN
500.0000 [IU] | Freq: Once | INTRAVENOUS | Status: AC | PRN
  Administered 2021-01-07: 500 [IU]
  Filled 2021-01-07: qty 5

## 2021-01-07 NOTE — Patient Instructions (Signed)
[facility] Discharge Instructions for Patients Receiving Chemotherapy  Today you received the following chemotherapy agents Gemzar  To help prevent nausea and vomiting after your treatment, we encourage you to take your nausea medication as directed.   If you develop nausea and vomiting that is not controlled by your nausea medication, call the clinic.   BELOW ARE SYMPTOMS THAT SHOULD BE REPORTED IMMEDIATELY:  *FEVER GREATER THAN 100.5 F  *CHILLS WITH OR WITHOUT FEVER  NAUSEA AND VOMITING THAT IS NOT CONTROLLED WITH YOUR NAUSEA MEDICATION  *UNUSUAL SHORTNESS OF BREATH  *UNUSUAL BRUISING OR BLEEDING  TENDERNESS IN MOUTH AND THROAT WITH OR WITHOUT PRESENCE OF ULCERS  *URINARY PROBLEMS  *BOWEL PROBLEMS  UNUSUAL RASH Items with * indicate a potential emergency and should be followed up as soon as possible.  Feel free to call the clinic should you have any questions or concerns. The clinic phone number is [phone].  Please show the [facility] at check-in to the Emergency Department and triage nurse.

## 2021-01-14 ENCOUNTER — Other Ambulatory Visit: Payer: Self-pay

## 2021-01-14 ENCOUNTER — Inpatient Hospital Stay: Payer: Medicare Other

## 2021-01-14 ENCOUNTER — Encounter (HOSPITAL_COMMUNITY): Payer: Self-pay

## 2021-01-14 ENCOUNTER — Ambulatory Visit (HOSPITAL_COMMUNITY)
Admission: RE | Admit: 2021-01-14 | Discharge: 2021-01-14 | Disposition: A | Discharge status: Home / Self Care | Payer: Medicare Other | Source: Ambulatory Visit | Attending: Oncology | Admitting: Oncology

## 2021-01-14 DIAGNOSIS — Z5111 Encounter for antineoplastic chemotherapy: Secondary | ICD-10-CM | POA: Diagnosis not present

## 2021-01-14 DIAGNOSIS — C680 Malignant neoplasm of urethra: Secondary | ICD-10-CM

## 2021-01-14 DIAGNOSIS — Z95828 Presence of other vascular implants and grafts: Secondary | ICD-10-CM

## 2021-01-14 HISTORY — DX: Malignant neoplasm of unspecified ureter: C66.9

## 2021-01-14 LAB — CMP (CANCER CENTER ONLY)
ALT: 13 U/L (ref 0–44)
AST: 25 U/L (ref 15–41)
Albumin: 3.7 g/dL (ref 3.5–5.0)
Alkaline Phosphatase: 76 U/L (ref 38–126)
Anion gap: 7 (ref 5–15)
BUN: 13 mg/dL (ref 8–23)
CO2: 24 mmol/L (ref 22–32)
Calcium: 8.6 mg/dL — ABNORMAL LOW (ref 8.9–10.3)
Chloride: 105 mmol/L (ref 98–111)
Creatinine: 0.73 mg/dL (ref 0.44–1.00)
GFR, Estimated: >60 mL/min (ref >60)
Glucose, Bld: 82 mg/dL (ref 70–99)
Potassium: 3.9 mmol/L (ref 3.5–5.1)
Sodium: 136 mmol/L (ref 135–145)
Total Bilirubin: 0.5 mg/dL (ref 0.3–1.2)
Total Protein: 6.2 g/dL — ABNORMAL LOW (ref 6.5–8.1)

## 2021-01-14 LAB — CBC WITH DIFFERENTIAL (CANCER CENTER ONLY)
Abs Immature Granulocytes: 0.01 10*3/uL (ref 0.00–0.07)
Basophils Absolute: 0 10*3/uL (ref 0.0–0.1)
Basophils Relative: 0 %
Eosinophils Absolute: 0 10*3/uL (ref 0.0–0.5)
Eosinophils Relative: 0 %
HCT: 25.5 % — ABNORMAL LOW (ref 36.0–46.0)
Hemoglobin: 9.1 g/dL — ABNORMAL LOW (ref 12.0–15.0)
Immature Granulocytes: 0 %
Lymphocytes Relative: 50 %
Lymphs Abs: 1.6 10*3/uL (ref 0.7–4.0)
MCH: 33.8 pg (ref 26.0–34.0)
MCHC: 35.7 g/dL (ref 30.0–36.0)
MCV: 94.8 fL (ref 80.0–100.0)
Monocytes Absolute: 0.5 10*3/uL (ref 0.1–1.0)
Monocytes Relative: 16 %
Neutro Abs: 1.1 10*3/uL — ABNORMAL LOW (ref 1.7–7.7)
Neutrophils Relative %: 34 %
Platelet Count: 86 10*3/uL — ABNORMAL LOW (ref 150–400)
RBC: 2.69 MIL/uL — ABNORMAL LOW (ref 3.87–5.11)
RDW: 14 % (ref 11.5–15.5)
WBC Count: 3.3 10*3/uL — ABNORMAL LOW (ref 4.0–10.5)
nRBC: 0 % (ref 0.0–0.2)

## 2021-01-14 MED ORDER — IOHEXOL 300 MG/ML  SOLN
100.0000 mL | Freq: Once | INTRAMUSCULAR | Status: AC | PRN
  Administered 2021-01-14: 100 mL via INTRAVENOUS

## 2021-01-14 MED ORDER — HEPARIN SOD (PORK) LOCK FLUSH 100 UNIT/ML IV SOLN
INTRAVENOUS | Status: AC
  Filled 2021-01-14: qty 5

## 2021-01-14 MED ORDER — HEPARIN SOD (PORK) LOCK FLUSH 100 UNIT/ML IV SOLN
500.0000 [IU] | Freq: Once | INTRAVENOUS | Status: AC
  Administered 2021-01-14: 500 [IU] via INTRAVENOUS

## 2021-01-14 MED ORDER — SODIUM CHLORIDE 0.9% FLUSH
10.0000 mL | Freq: Once | INTRAVENOUS | Status: AC
  Administered 2021-01-14: 10 mL
  Filled 2021-01-14: qty 10

## 2021-01-14 MED ORDER — IOHEXOL 9 MG/ML PO SOLN
1000.0000 mL | ORAL | Status: AC
  Administered 2021-01-14: 1000 mL via ORAL

## 2021-01-22 ENCOUNTER — Other Ambulatory Visit: Payer: Self-pay

## 2021-01-22 ENCOUNTER — Inpatient Hospital Stay: Payer: Medicare Other | Admitting: Oncology

## 2021-01-22 VITALS — BP 146/62 | HR 83 | Temp 97.0°F | Resp 18 | Wt 123.1 lb

## 2021-01-22 DIAGNOSIS — C680 Malignant neoplasm of urethra: Secondary | ICD-10-CM | POA: Diagnosis not present

## 2021-01-22 DIAGNOSIS — Z95828 Presence of other vascular implants and grafts: Secondary | ICD-10-CM | POA: Diagnosis not present

## 2021-01-22 DIAGNOSIS — Z5111 Encounter for antineoplastic chemotherapy: Secondary | ICD-10-CM | POA: Diagnosis not present

## 2021-01-22 NOTE — Progress Notes (Signed)
Hematology and Oncology Follow Up Visit  Sandra Mann [id number] 1951/08/16 70 y.o. 01/22/2021 8:18 AM Sandra Mann, Sandra Mann, MDEksir, Sandra Conroy, MD   Principle Diagnosis: 70 year old woman with urethral cancer diagnosed in November 2021.  She was found to have T4N0 high-grade urothelial carcinoma presented with a pelvic mass.   Prior Therapy: She is status post cystoscopy and biopsy in November 2021 which showed high-grade urothelial carcinoma. Neoadjuvant chemotherapy utilizing gemcitabine and cisplatin started on October 29, 2020.  She completed 4 cycles of therapy on January 07, 2021.  Current therapy: Under evaluation for definitive treatment.   Interim History: [name] presents today for a repeat follow-up.  Since the last visit, she completed chemotherapy without any major complications.  She denies any nausea, vomiting or abdominal pain.  She denies any residual neuropathy or fatigue.  She continues to have urinary incontinence even with a Foley catheter.  His performance status quality of life remain excellent.  Medications: Updated on review. Current Outpatient Medications  Medication Sig Dispense Refill  . cycloSPORINE (RESTASIS) 0.05 % ophthalmic emulsion Place 1 drop into both eyes 2 (two) times daily.    . Estradiol 10 MCG TABS vaginal tablet Place 10 mcg vaginally daily.    . folic acid (FOLVITE) 800 MCG tablet Take 1,600 mcg by mouth daily.     Marland Kitchen lidocaine-prilocaine (EMLA) cream Apply 1 application topically as needed. 30 g 0  . magnesium hydroxide (MILK OF MAGNESIA) 400 MG/5ML suspension Take 5 mLs by mouth daily as needed for mild constipation. 355 mL 0  . mesalamine (CANASA) 1000 MG suppository Place 1,000 mg rectally every other day.     . methotrexate (RHEUMATREX) 2.5 MG tablet Take 20 mg by mouth once a week.     . Multiple Vitamin (MULTIVITAMIN WITH MINERALS) TABS tablet Take 1 tablet by mouth daily.    Marland Kitchen omeprazole (PRILOSEC) 40 MG capsule Take 40 mg by mouth in  the morning.     . prochlorperazine (COMPAZINE) 10 MG tablet Take 1 tablet (10 mg total) by mouth every 6 (six) hours as needed for nausea or vomiting. 30 tablet 0  . senna-docusate (SENNA S) 8.6-50 MG tablet Take 1 tablet by mouth 2 (two) times daily. 60 tablet 3   No current facility-administered medications for this visit.     Allergies:  Allergies  Allergen Reactions  . Codeine Nausea And Vomiting  . Sulfa Antibiotics Nausea And Vomiting  . Vicodin [Hydrocodone-Acetaminophen] Nausea And Vomiting      Physical Exam:  Blood pressure (!) 146/62, pulse 83, temperature (!) 97 F (36.1 C), temperature source Tympanic, resp. rate 18, weight 123 lb 1.6 oz (55.8 kg), SpO2 100 %.     ECOG: 0   General appearance: Alert, awake without any distress. Head: Atraumatic without abnormalities Oropharynx: Without any thrush or ulcers. Eyes: No scleral icterus. Lymph nodes: No lymphadenopathy noted in the cervical, supraclavicular, or axillary nodes Heart:regular rate and rhythm, without any murmurs or gallops.   Lung: Clear to auscultation without any rhonchi, wheezes or dullness to percussion. Abdomin: Soft, nontender without any shifting dullness or ascites. Musculoskeletal: No clubbing or cyanosis. Neurological: No motor or sensory deficits. Skin: No rashes or lesions.  .       Lab Results: Lab Results  Component Value Date   WBC 3.3 (L) 01/14/2021   HGB 9.1 (L) 01/14/2021   HCT 25.5 (L) 01/14/2021   MCV 94.8 01/14/2021   PLT 86 (L) 01/14/2021     Chemistry  Component Value Date/Time   NA 136 01/14/2021 1044   K 3.9 01/14/2021 1044   CL 105 01/14/2021 1044   CO2 24 01/14/2021 1044   BUN 13 01/14/2021 1044   CREATININE 0.73 01/14/2021 1044      Component Value Date/Time   CALCIUM 8.6 (L) 01/14/2021 1044   ALKPHOS 76 01/14/2021 1044   AST 25 01/14/2021 1044   ALT 13 01/14/2021 1044   BILITOT 0.5 01/14/2021 1044     IMPRESSION: 1. A previously seen  hypodense mass of the urethra and bladder trigone is no longer well appreciated, possibly partially resected, with some residual contrast hyperenhancement about the superior urethra and bladder neck. Findings are consistent with resection and/or treatment response. 2. No evidence of lymphadenopathy or metastatic disease in the chest, abdomen, or pelvis. 3. The bladder is decompressed by Foley catheter and markedly thickened, suggesting infectious or inflammatory cystitis, possibly related to chemotherapeutic agents in this clinical setting. 4. Coronary artery disease.   Impression and Plan:  70 year old woman with:  1.    Urethral cancer presented with T4N0 high-grade tumor with pelvic mass diagnosed in November 2021.    CT scan obtained on January 14, 2021 was personally reviewed and discussed with the patient today.  She had an excellent response to therapy with reduction of her urethral and bladder tumor which is no longer detected.  She has no evidence of advanced disease at this time.  Treatment options moving forward were discussed and I favor a primary surgical therapy as a definitive modality moving forward.  Radiation would be an alternative if she cannot have that done.  She is scheduled an appointment with Dr. Tresa Mann to discuss surgical option at this time.  2. IV access:Port-A-Cath will remain in place and be flushed periodically.  3. Antiemetics: No residual nausea or vomiting reported.  4. Renal function surveillance: Her kidney function remains normal at this time after platinum based therapy.  5. Goals of care:Her disease is potentially curable and aggressive measures remains warranted.  6. Follow-up: She will return in 2 months for repeat evaluation.  30  minutes were dedicated to this visit.  The time was spent on reviewing her disease status, treatment options and answering questions regarding future plan of care.  Sandra Button, MD 3/24/20228:18  AM

## 2021-01-27 ENCOUNTER — Other Ambulatory Visit: Payer: Self-pay | Admitting: Urology

## 2021-02-20 ENCOUNTER — Other Ambulatory Visit (HOSPITAL_COMMUNITY): Payer: Self-pay

## 2021-03-03 ENCOUNTER — Inpatient Hospital Stay (HOSPITAL_COMMUNITY)
Admission: RE | Admit: 2021-03-03 | Discharge: 2021-03-10 | DRG: 654 | Disposition: A | Discharge status: Home Health | Payer: Medicare Other | Source: Ambulatory Visit | Attending: Urology | Admitting: Urology

## 2021-03-03 ENCOUNTER — Other Ambulatory Visit: Payer: Self-pay | Admitting: Urology

## 2021-03-03 DIAGNOSIS — Z9221 Personal history of antineoplastic chemotherapy: Secondary | ICD-10-CM | POA: Diagnosis not present

## 2021-03-03 DIAGNOSIS — D62 Acute posthemorrhagic anemia: Secondary | ICD-10-CM | POA: Diagnosis not present

## 2021-03-03 DIAGNOSIS — C7919 Secondary malignant neoplasm of other urinary organs: Secondary | ICD-10-CM | POA: Diagnosis present

## 2021-03-03 DIAGNOSIS — K567 Ileus, unspecified: Secondary | ICD-10-CM | POA: Diagnosis not present

## 2021-03-03 DIAGNOSIS — Z885 Allergy status to narcotic agent status: Secondary | ICD-10-CM | POA: Diagnosis not present

## 2021-03-03 DIAGNOSIS — Z882 Allergy status to sulfonamides status: Secondary | ICD-10-CM

## 2021-03-03 DIAGNOSIS — Z20822 Contact with and (suspected) exposure to covid-19: Secondary | ICD-10-CM | POA: Diagnosis present

## 2021-03-03 DIAGNOSIS — Z79899 Other long term (current) drug therapy: Secondary | ICD-10-CM

## 2021-03-03 DIAGNOSIS — Z87891 Personal history of nicotine dependence: Secondary | ICD-10-CM | POA: Diagnosis not present

## 2021-03-03 DIAGNOSIS — Z833 Family history of diabetes mellitus: Secondary | ICD-10-CM | POA: Diagnosis not present

## 2021-03-03 DIAGNOSIS — Z8249 Family history of ischemic heart disease and other diseases of the circulatory system: Secondary | ICD-10-CM | POA: Diagnosis not present

## 2021-03-03 DIAGNOSIS — M069 Rheumatoid arthritis, unspecified: Secondary | ICD-10-CM | POA: Diagnosis present

## 2021-03-03 DIAGNOSIS — C775 Secondary and unspecified malignant neoplasm of intrapelvic lymph nodes: Secondary | ICD-10-CM | POA: Diagnosis present

## 2021-03-03 DIAGNOSIS — C680 Malignant neoplasm of urethra: Principal | ICD-10-CM | POA: Diagnosis present

## 2021-03-03 DIAGNOSIS — K219 Gastro-esophageal reflux disease without esophagitis: Secondary | ICD-10-CM | POA: Diagnosis present

## 2021-03-03 DIAGNOSIS — M1990 Unspecified osteoarthritis, unspecified site: Secondary | ICD-10-CM | POA: Diagnosis present

## 2021-03-03 DIAGNOSIS — M8580 Other specified disorders of bone density and structure, unspecified site: Secondary | ICD-10-CM | POA: Diagnosis present

## 2021-03-03 LAB — CBC
HCT: 33.9 % — ABNORMAL LOW (ref 36.0–46.0)
Hemoglobin: 11.7 g/dL — ABNORMAL LOW (ref 12.0–15.0)
MCH: 32.7 pg (ref 26.0–34.0)
MCHC: 34.5 g/dL (ref 30.0–36.0)
MCV: 94.7 fL (ref 80.0–100.0)
Platelets: 198 10*3/uL (ref 150–400)
RBC: 3.58 MIL/uL — ABNORMAL LOW (ref 3.87–5.11)
RDW: 12.1 % (ref 11.5–15.5)
WBC: 8.8 10*3/uL (ref 4.0–10.5)
nRBC: 0 % (ref 0.0–0.2)

## 2021-03-03 LAB — COMPREHENSIVE METABOLIC PANEL
ALT: 15 U/L (ref 0–44)
AST: 29 U/L (ref 15–41)
Albumin: 4.3 g/dL (ref 3.5–5.0)
Alkaline Phosphatase: 70 U/L (ref 38–126)
Anion gap: 10 (ref 5–15)
BUN: 16 mg/dL (ref 8–23)
CO2: 23 mmol/L (ref 22–32)
Calcium: 9.3 mg/dL (ref 8.9–10.3)
Chloride: 104 mmol/L (ref 98–111)
Creatinine, Ser: 0.84 mg/dL (ref 0.44–1.00)
GFR, Estimated: >60 mL/min (ref >60)
Glucose, Bld: 93 mg/dL (ref 70–99)
Potassium: 3.7 mmol/L (ref 3.5–5.1)
Sodium: 137 mmol/L (ref 135–145)
Total Bilirubin: 0.8 mg/dL (ref 0.3–1.2)
Total Protein: 7.6 g/dL (ref 6.5–8.1)

## 2021-03-03 MED ORDER — PEG 3350-KCL-NA BICARB-NACL 420 G PO SOLR
4000.0000 mL | Freq: Once | ORAL | Status: AC
  Administered 2021-03-03: 4000 mL via ORAL

## 2021-03-03 MED ORDER — METRONIDAZOLE 500 MG PO TABS
500.0000 mg | ORAL_TABLET | ORAL | Status: AC
  Administered 2021-03-03 (×2): 500 mg via ORAL
  Filled 2021-03-03 (×2): qty 1

## 2021-03-03 MED ORDER — MUPIROCIN 2 % EX OINT
1.0000 "application " | TOPICAL_OINTMENT | Freq: Two times a day (BID) | CUTANEOUS | Status: AC
  Administered 2021-03-03 – 2021-03-08 (×9): 1 via NASAL
  Filled 2021-03-03 (×2): qty 22

## 2021-03-03 MED ORDER — SODIUM CHLORIDE 0.9 % IV SOLN: INTRAVENOUS | Status: DC

## 2021-03-03 MED ORDER — PANTOPRAZOLE SODIUM 40 MG PO TBEC
40.0000 mg | DELAYED_RELEASE_TABLET | Freq: Every day | ORAL | Status: DC
  Administered 2021-03-05 – 2021-03-10 (×6): 40 mg via ORAL
  Filled 2021-03-03 (×6): qty 1

## 2021-03-03 MED ORDER — PIPERACILLIN-TAZOBACTAM 3.375 G IVPB 30 MIN
3.3750 g | INTRAVENOUS | Status: AC
  Administered 2021-03-04: 3.375 g via INTRAVENOUS
  Filled 2021-03-03 (×2): qty 50

## 2021-03-03 MED ORDER — ACETAMINOPHEN 500 MG PO TABS
1000.0000 mg | ORAL_TABLET | Freq: Three times a day (TID) | ORAL | Status: DC | PRN
  Administered 2021-03-03 – 2021-03-04 (×2): 1000 mg via ORAL
  Filled 2021-03-03 (×2): qty 2

## 2021-03-03 MED ORDER — ALVIMOPAN 12 MG PO CAPS
12.0000 mg | ORAL_CAPSULE | ORAL | Status: AC
  Administered 2021-03-04: 12 mg via ORAL
  Filled 2021-03-03: qty 1

## 2021-03-03 MED ORDER — NEOMYCIN SULFATE 500 MG PO TABS
1000.0000 mg | ORAL_TABLET | ORAL | Status: AC
  Administered 2021-03-03 (×2): 1000 mg via ORAL
  Filled 2021-03-03 (×2): qty 2

## 2021-03-03 MED ORDER — CYCLOSPORINE 0.05 % OP EMUL
1.0000 [drp] | Freq: Two times a day (BID) | OPHTHALMIC | Status: DC
  Administered 2021-03-03 – 2021-03-10 (×12): 1 [drp] via OPHTHALMIC
  Filled 2021-03-03 (×15): qty 1

## 2021-03-03 NOTE — H&P (Signed)
Sandra Mann is an 70 y.o. female.    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 Initial MRI clinicall localized and path urothelial with 50% Sq differentiation and some vaginal wall inovlvement anterioorly (T4). Undewent neopadjuvant gem-cis x 4 cycles via port under care of Dr. Alen Blew finishing early 12/2020. Restagign CT chest/abd/pelvis with come decrease in local mass and no distant disease. She remains with catheter for comfort.   PMH sig for BTL. NO ischemic CV disease / blood thinners. Her husband Mortimer Fries is very involved. Her PCP is Eyvonne Mechanic MD   Today " Sandra Mann " is seen as pre-op admission for bowel prep, stomal marking prior to major extirpative surgery for urethral/bladder cancer. No interval fevers. She is having some arthritis flair but remains off methotrexate / NSAIDS pre-op.     Past Medical History:  Diagnosis Date  . Chest pain   . GERD (gastroesophageal reflux disease)   . 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 Oral mass    left  . Osteopenia   . PONV (postoperative nausea and vomiting) 1988   "after tubes tied"  . Rheumatoid arteritis ([place])   . Rheumatoid arthritis ([place])   . Ulcerative proctitis ([place])   . 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 Wears glasses     Past Surgical History:  Procedure Laterality Date  . COLONOSCOPY    . CYSTOSCOPY WITH URETHRAL DILATATION N/A 09/23/2020   Procedure: CYSTOSCOPY WITH URETHRAL DILATATION, EXAM UNDER ANESTHESIA, BIOPSY OF URETHRAL MASS;  Surgeon: Robley Fries, MD;  Location: [facility];  Service: Urology;  Laterality: N/A;  . EXCISION ORAL TUMOR Left 03/27/2014   Procedure: EXCISION OF ORAL CANCER;  Surgeon: Izora Gala, MD;  Location: [facility];  Service: ENT;  Laterality: Left;  . FINGER GANGLION CYST EXCISION Right 2007   small dip  . INCISION AND DRAINAGE  ABSCESS Left 06/12/2014   Procedure: LEFT INCISION AND DRAINAGE MANDIBULAR ABSCESS;  Surgeon: Izora Gala, MD;  Location: [facility];  Service: ENT;  Laterality: Left;  . INCISION AND DRAINAGE ABSCESS Left 11/22/2014   Procedure: INCISION AND DRAINAGE LEFT  MANDIBULAR ABSCESS;  Surgeon: Izora Gala, MD;  Location: [facility];  Service: ENT;  Laterality: Left;  . INCISION AND DRAINAGE OF PERITONSILLAR ABCESS Left 06/01/2014   Procedure: INCISION AND DRAINAGE Masticator Space Infection;  Surgeon: Izora Gala, MD;  Location: [facility];  Service: ENT;  Laterality: Left;  . IR IMAGING GUIDED PORT INSERTION  10/21/2020  . MULTIPLE EXTRACTIONS WITH ALVEOLOPLASTY Left 03/27/2014   Procedure: EXTRACTIONS OF NECESSARY TOOTH ;  Surgeon: Ceasar Mons, DDS;  Location: [facility];  Service: Oral Surgery;  Laterality: Left;  . TUBAL LIGATION  ~ 1988    Family History  Problem Relation Age of Onset  . Heart attack Mother   . Diabetes Maternal Grandmother    Social History:  reports that she quit smoking about 20 years ago. Her smoking use included cigarettes. She has a 30.00 pack-year smoking history. She has never used smokeless tobacco. She reports current alcohol use. She reports that she does not use drugs.  Allergies:  Allergies  Allergen Reactions  . Codeine Nausea And Vomiting  . Sulfa Antibiotics Nausea And Vomiting  . Vicodin [Hydrocodone-Acetaminophen] Nausea And Vomiting    Medications Prior to Admission  Medication Sig Dispense Refill  .  cycloSPORINE (RESTASIS) 0.05 % ophthalmic emulsion Place 1 drop into both eyes 2 (two) times daily.    Marland Kitchen lidocaine-prilocaine (EMLA) cream Apply 1 application topically as needed. (Patient taking differently: Apply 1 application topically as needed (for the port).) 30 g 0  . mesalamine (CANASA) 1000 MG suppository Place 1,000 mg rectally every other day. At bedtime    . Multiple Vitamin (MULTIVITAMIN WITH MINERALS) TABS tablet Take 1 tablet by mouth at  bedtime. Silver    . omeprazole (PRILOSEC) 40 MG capsule Take 40 mg by mouth in the morning.     . senna-docusate (SENNA S) 8.6-50 MG tablet Take 1 tablet by mouth 2 (two) times daily. (Patient taking differently: Take 1 tablet by mouth at bedtime.) 60 tablet 3  . folic acid (FOLVITE) 497 MCG tablet Take 1,600 mcg by mouth daily.     . magnesium hydroxide (MILK OF MAGNESIA) 400 MG/5ML suspension Take 5 mLs by mouth daily as needed for mild constipation. (Patient not taking: Reported on 02/18/2021) 355 mL 0  . methotrexate (RHEUMATREX) 2.5 MG tablet Take 20 mg by mouth once a week.     . prochlorperazine (COMPAZINE) 10 MG tablet Take 1 tablet (10 mg total) by mouth every 6 (six) hours as needed for nausea or vomiting. (Patient not taking: Reported on 02/18/2021) 30 tablet 0    No results found for this or any previous visit (from the past 48 hour(s)). No results found.  Review of Systems  Constitutional: Negative for chills and fever.  Musculoskeletal: Positive for arthralgias, joint swelling and myalgias.  All other systems reviewed and are negative.   There were no vitals taken for this visit. Physical Exam Vitals reviewed.  HENT:     Head: Normocephalic.     Mouth/Throat:     Mouth: Mucous membranes are moist.  Eyes:     Pupils: Pupils are equal, round, and reactive to light.  Cardiovascular:     Rate and Rhythm: Normal rate.     Pulses: Normal pulses.  Pulmonary:     Effort: Pulmonary effort is normal.  Abdominal:     General: Abdomen is flat.     Comments: Stomal marking site noted.   Genitourinary:    Comments: In situ catheter with non-foul urine.  Musculoskeletal:        General: Normal range of motion.     Cervical back: Normal range of motion.     Comments: Some LUE pain c/w known arthritis flair.   Skin:    General: Skin is warm.     Capillary Refill: Capillary refill takes less than 2 seconds.  Neurological:     Mental Status: She is alert.  Psychiatric:         Mood and Affect: Mood normal.      Assessment/Plan  CMP, CBC, GO-Lytely bowel prep, clears, NS at 50, stomal marking, PO ABX prep NPO p MN in preparation for cysto / ICG injection, robotic cystectomy + node dissection + TAH/BSO + coduit diversion with curative intent. Risk (including non-cure and mortality), benefits, alternatives, expected peri-op course discussed previously and reiterated today.   Alexis Frock, MD 03/03/2021, 4:46 PM

## 2021-03-03 NOTE — Anesthesia Preprocedure Evaluation (Addendum)
Anesthesia Evaluation  Patient identified by MRN, date of birth, ID band Patient awake    Reviewed: Allergy & Precautions, H&P , NPO status , Patient's Chart, lab work & pertinent test results  History of Anesthesia Complications (+) PONV  Airway Mallampati: II  TM Distance: >3 FB Neck ROM: Full    Dental no notable dental hx. (+) Teeth Intact, Dental Advisory Given   Pulmonary neg pulmonary ROS, former smoker,    Pulmonary exam normal breath sounds clear to auscultation       Cardiovascular Exercise Tolerance: Good negative cardio ROS   Rhythm:Regular Rate:Normal     Neuro/Psych negative neurological ROS  negative psych ROS   GI/Hepatic Neg liver ROS, PUD, GERD  ,  Endo/Other  negative endocrine ROS  Renal/GU negative Renal ROS  negative genitourinary   Musculoskeletal  (+) Arthritis , Osteoarthritis,    Abdominal   Peds  Hematology negative hematology ROS (+)   Anesthesia Other Findings   Reproductive/Obstetrics negative OB ROS                            Anesthesia Physical Anesthesia Plan  ASA: II  Anesthesia Plan: General   Post-op Pain Management:    Induction: Intravenous  PONV Risk Score and Plan: 4 or greater and Ondansetron, Aprepitant, Dexamethasone, Midazolam and Scopolamine patch - Pre-op  Airway Management Planned: Oral ETT  Additional Equipment: Arterial line  Intra-op Plan:   Post-operative Plan: Extubation in OR and Possible Post-op intubation/ventilation  Informed Consent: I have reviewed the patients History and Physical, chart, labs and discussed the procedure including the risks, benefits and alternatives for the proposed anesthesia with the patient or authorized representative who has indicated his/her understanding and acceptance.     Dental advisory given  Plan Discussed with: CRNA  Anesthesia Plan Comments:        Anesthesia Quick  Evaluation

## 2021-03-03 NOTE — Consult Note (Signed)
WOC Nurse Consult Note:  WOC Nurse requested for preoperative stoma site marking by Dr. Clint Lipps.  Discussed surgical procedure and stoma creation with patient and family.  Explained role of the [facility] nurse team.  Answered patient and family questions.   Examined patient sitting, leaning forward and standing in order to place the marking in the patient's visual field, away from any creases or abdominal contour issues and within the rectus muscle.   Marked for ileal conduit in the RLQ 5 cm to the right of the umbilicus and  2cm below the umbilicus.  Patient's abdomen cleansed with CHG wipes at site markings, allowed to air dry prior to marking. Covered mark with thin film transparent dressing to preserve mark until date of surgery (tomorrow, 03/04/21).   [facility] Nurse team will follow up with patient after surgery for continue ostomy care and teaching.   Thanks, Maudie Flakes, MSN, RN, [place], Arther Abbott  Pager# [phone]

## 2021-03-03 NOTE — Discharge Instructions (Signed)

## 2021-03-04 ENCOUNTER — Encounter (HOSPITAL_COMMUNITY)
Admission: RE | Disposition: A | Discharge status: Home Health | Payer: Self-pay | Source: Ambulatory Visit | Attending: Urology

## 2021-03-04 ENCOUNTER — Inpatient Hospital Stay (HOSPITAL_COMMUNITY): Payer: Medicare Other | Admitting: Certified Registered Nurse Anesthetist

## 2021-03-04 ENCOUNTER — Encounter (HOSPITAL_COMMUNITY): Payer: Self-pay | Admitting: Urology

## 2021-03-04 DIAGNOSIS — C680 Malignant neoplasm of urethra: Secondary | ICD-10-CM | POA: Diagnosis present

## 2021-03-04 HISTORY — PX: CYSTOSCOPY WITH INJECTION: SHX1424

## 2021-03-04 HISTORY — PX: LYMPH NODE DISSECTION: SHX5087

## 2021-03-04 HISTORY — PX: ROBOT ASSISTED LAPAROSCOPIC COMPLETE CYSTECT ILEAL CONDUIT: SHX5139

## 2021-03-04 HISTORY — PX: ROBOTIC ASSISTED LAPAROSCOPIC HYSTERECTOMY AND SALPINGECTOMY: SHX6379

## 2021-03-04 LAB — ABO/RH: ABO/RH(D): O NEG

## 2021-03-04 LAB — HEMOGLOBIN AND HEMATOCRIT, BLOOD
HCT: 29.1 % — ABNORMAL LOW (ref 36.0–46.0)
Hemoglobin: 9.7 g/dL — ABNORMAL LOW (ref 12.0–15.0)

## 2021-03-04 LAB — SURGICAL PCR SCREEN
MRSA, PCR: NEGATIVE
Staphylococcus aureus: POSITIVE — AB

## 2021-03-04 LAB — SARS CORONAVIRUS 2 (TAT 6-24 HRS): SARS Coronavirus 2: NEGATIVE

## 2021-03-04 SURGERY — CYSTECTOMY, ROBOT-ASSISTED, WITH ILEAL CONDUIT CREATION
Anesthesia: General

## 2021-03-04 MED ORDER — HYDROMORPHONE HCL 2 MG/ML IJ SOLN
INTRAMUSCULAR | Status: AC
  Filled 2021-03-04: qty 1

## 2021-03-04 MED ORDER — APREPITANT 40 MG PO CAPS
40.0000 mg | ORAL_CAPSULE | Freq: Once | ORAL | Status: AC
  Administered 2021-03-04: 40 mg via ORAL

## 2021-03-04 MED ORDER — ONDANSETRON HCL 4 MG/2ML IJ SOLN
INTRAMUSCULAR | Status: AC
  Filled 2021-03-04: qty 2

## 2021-03-04 MED ORDER — PROPOFOL 500 MG/50ML IV EMUL
INTRAVENOUS | Status: DC | PRN
  Administered 2021-03-04: 25 ug/kg/min via INTRAVENOUS

## 2021-03-04 MED ORDER — ROCURONIUM BROMIDE 10 MG/ML (PF) SYRINGE
PREFILLED_SYRINGE | INTRAVENOUS | Status: AC
  Filled 2021-03-04: qty 10

## 2021-03-04 MED ORDER — FENTANYL CITRATE (PF) 250 MCG/5ML IJ SOLN
INTRAMUSCULAR | Status: AC
  Filled 2021-03-04: qty 5

## 2021-03-04 MED ORDER — SCOPOLAMINE 1 MG/3DAYS TD PT72
MEDICATED_PATCH | TRANSDERMAL | Status: AC
  Filled 2021-03-04: qty 1

## 2021-03-04 MED ORDER — ALBUMIN HUMAN 5 % IV SOLN: INTRAVENOUS | Status: DC | PRN

## 2021-03-04 MED ORDER — ONDANSETRON HCL 4 MG/2ML IJ SOLN: 4.0000 mg | INTRAMUSCULAR | Status: DC | PRN

## 2021-03-04 MED ORDER — LACTATED RINGERS IV SOLN: INTRAVENOUS | Status: DC

## 2021-03-04 MED ORDER — DEXAMETHASONE SODIUM PHOSPHATE 4 MG/ML IJ SOLN
INTRAMUSCULAR | Status: DC | PRN
  Administered 2021-03-04: 5 mg via INTRAVENOUS

## 2021-03-04 MED ORDER — MIDAZOLAM HCL 5 MG/5ML IJ SOLN
INTRAMUSCULAR | Status: DC | PRN
  Administered 2021-03-04: 2 mg via INTRAVENOUS

## 2021-03-04 MED ORDER — HYDROMORPHONE HCL 1 MG/ML IJ SOLN
INTRAMUSCULAR | Status: DC | PRN
  Administered 2021-03-04: 1 mg via INTRAVENOUS

## 2021-03-04 MED ORDER — EPHEDRINE 5 MG/ML INJ
INTRAVENOUS | Status: AC
  Filled 2021-03-04: qty 10

## 2021-03-04 MED ORDER — FENTANYL CITRATE (PF) 100 MCG/2ML IJ SOLN
INTRAMUSCULAR | Status: AC
  Filled 2021-03-04: qty 2

## 2021-03-04 MED ORDER — HYDROMORPHONE HCL 1 MG/ML IJ SOLN
0.2500 mg | INTRAMUSCULAR | Status: DC | PRN
  Administered 2021-03-04 (×2): 0.5 mg via INTRAVENOUS

## 2021-03-04 MED ORDER — LIDOCAINE 2% (20 MG/ML) 5 ML SYRINGE
INTRAMUSCULAR | Status: DC | PRN
  Administered 2021-03-04: 60 mg via INTRAVENOUS

## 2021-03-04 MED ORDER — APREPITANT 40 MG PO CAPS
ORAL_CAPSULE | ORAL | Status: AC
  Filled 2021-03-04: qty 1

## 2021-03-04 MED ORDER — FENTANYL CITRATE (PF) 100 MCG/2ML IJ SOLN
INTRAMUSCULAR | Status: DC | PRN
  Administered 2021-03-04: 100 ug via INTRAVENOUS
  Administered 2021-03-04 (×5): 50 ug via INTRAVENOUS

## 2021-03-04 MED ORDER — PROPOFOL 1000 MG/100ML IV EMUL
INTRAVENOUS | Status: AC
  Filled 2021-03-04: qty 100

## 2021-03-04 MED ORDER — HYDROMORPHONE HCL 1 MG/ML IJ SOLN
INTRAMUSCULAR | Status: AC
  Filled 2021-03-04: qty 1

## 2021-03-04 MED ORDER — SODIUM CHLORIDE (PF) 0.9 % IJ SOLN
INTRAMUSCULAR | Status: AC
  Filled 2021-03-04: qty 20

## 2021-03-04 MED ORDER — GLYCOPYRROLATE PF 0.2 MG/ML IJ SOSY
PREFILLED_SYRINGE | INTRAMUSCULAR | Status: DC | PRN
  Administered 2021-03-04: .2 mg via INTRAVENOUS

## 2021-03-04 MED ORDER — ONDANSETRON HCL 4 MG/2ML IJ SOLN
INTRAMUSCULAR | Status: DC | PRN
  Administered 2021-03-04: 4 mg via INTRAVENOUS

## 2021-03-04 MED ORDER — KETOROLAC TROMETHAMINE 15 MG/ML IJ SOLN
15.0000 mg | Freq: Four times a day (QID) | INTRAMUSCULAR | Status: AC
  Administered 2021-03-04 – 2021-03-06 (×6): 15 mg via INTRAVENOUS
  Filled 2021-03-04 (×6): qty 1

## 2021-03-04 MED ORDER — MIDAZOLAM HCL 2 MG/2ML IJ SOLN
INTRAMUSCULAR | Status: AC
  Filled 2021-03-04: qty 2

## 2021-03-04 MED ORDER — LABETALOL HCL 5 MG/ML IV SOLN
INTRAVENOUS | Status: AC
  Filled 2021-03-04: qty 4

## 2021-03-04 MED ORDER — BUPIVACAINE LIPOSOME 1.3 % IJ SUSP
20.0000 mL | Freq: Once | INTRAMUSCULAR | Status: AC
  Administered 2021-03-04: 20 mL
  Filled 2021-03-04: qty 20

## 2021-03-04 MED ORDER — ACETAMINOPHEN 500 MG PO TABS: 1000.0000 mg | ORAL_TABLET | Freq: Once | ORAL | Status: DC

## 2021-03-04 MED ORDER — DIPHENHYDRAMINE HCL 50 MG/ML IJ SOLN: 12.5000 mg | Freq: Four times a day (QID) | INTRAMUSCULAR | Status: DC | PRN

## 2021-03-04 MED ORDER — AMISULPRIDE (ANTIEMETIC) 5 MG/2ML IV SOLN
INTRAVENOUS | Status: AC
  Filled 2021-03-04: qty 4

## 2021-03-04 MED ORDER — EPHEDRINE SULFATE-NACL 50-0.9 MG/10ML-% IV SOSY
PREFILLED_SYRINGE | INTRAVENOUS | Status: DC | PRN
  Administered 2021-03-04: 5 mg via INTRAVENOUS

## 2021-03-04 MED ORDER — SENNOSIDES-DOCUSATE SODIUM 8.6-50 MG PO TABS
2.0000 | ORAL_TABLET | Freq: Every day | ORAL | Status: DC
  Administered 2021-03-04 – 2021-03-08 (×5): 2 via ORAL
  Filled 2021-03-04 (×6): qty 2

## 2021-03-04 MED ORDER — ALVIMOPAN 12 MG PO CAPS
12.0000 mg | ORAL_CAPSULE | Freq: Two times a day (BID) | ORAL | Status: DC
  Administered 2021-03-05 – 2021-03-10 (×10): 12 mg via ORAL
  Filled 2021-03-04 (×10): qty 1

## 2021-03-04 MED ORDER — DEXTROSE-NACL 5-0.45 % IV SOLN: INTRAVENOUS | Status: DC

## 2021-03-04 MED ORDER — SCOPOLAMINE 1 MG/3DAYS TD PT72
1.0000 | MEDICATED_PATCH | TRANSDERMAL | Status: DC
  Administered 2021-03-04: 1.5 mg via TRANSDERMAL

## 2021-03-04 MED ORDER — CHLORHEXIDINE GLUCONATE CLOTH 2 % EX PADS
6.0000 | MEDICATED_PAD | Freq: Every day | CUTANEOUS | Status: DC
  Administered 2021-03-06 – 2021-03-10 (×5): 6 via TOPICAL

## 2021-03-04 MED ORDER — ROCURONIUM BROMIDE 10 MG/ML (PF) SYRINGE
PREFILLED_SYRINGE | INTRAVENOUS | Status: DC | PRN
  Administered 2021-03-04: 30 mg via INTRAVENOUS
  Administered 2021-03-04: 70 mg via INTRAVENOUS

## 2021-03-04 MED ORDER — WATER FOR IRRIGATION, STERILE IR SOLN
Status: DC | PRN
  Administered 2021-03-04: 3000 mL

## 2021-03-04 MED ORDER — SODIUM CHLORIDE (PF) 0.9 % IJ SOLN
INTRAMUSCULAR | Status: DC | PRN
  Administered 2021-03-04: 20 mL

## 2021-03-04 MED ORDER — ROCURONIUM BROMIDE 10 MG/ML (PF) SYRINGE
PREFILLED_SYRINGE | INTRAVENOUS | Status: AC
  Filled 2021-03-04: qty 20

## 2021-03-04 MED ORDER — AMISULPRIDE (ANTIEMETIC) 5 MG/2ML IV SOLN
10.0000 mg | Freq: Once | INTRAVENOUS | Status: AC
  Administered 2021-03-04: 10 mg via INTRAVENOUS

## 2021-03-04 MED ORDER — DIPHENHYDRAMINE HCL 12.5 MG/5ML PO ELIX: 12.5000 mg | ORAL_SOLUTION | Freq: Four times a day (QID) | ORAL | Status: DC | PRN

## 2021-03-04 MED ORDER — PIPERACILLIN-TAZOBACTAM 3.375 G IVPB
3.3750 g | Freq: Three times a day (TID) | INTRAVENOUS | Status: AC
Start: 2021-03-04 — End: 2021-03-05
  Administered 2021-03-04 – 2021-03-05 (×3): 3.375 g via INTRAVENOUS
  Filled 2021-03-04 (×3): qty 50

## 2021-03-04 MED ORDER — SODIUM CHLORIDE 0.9 % IV SOLN: INTRAVENOUS | Status: DC | PRN

## 2021-03-04 MED ORDER — SUGAMMADEX SODIUM 200 MG/2ML IV SOLN
INTRAVENOUS | Status: DC | PRN
  Administered 2021-03-04: 200 mg via INTRAVENOUS

## 2021-03-04 MED ORDER — LACTATED RINGERS IR SOLN
Status: DC | PRN
  Administered 2021-03-04: 1000 mL

## 2021-03-04 MED ORDER — SPY AGENT GREEN - (INDOCYANINE FOR INJECTION)
INTRAMUSCULAR | Status: DC | PRN
  Administered 2021-03-04: 2 mL via INTRAMUSCULAR

## 2021-03-04 MED ORDER — PROPOFOL 10 MG/ML IV BOLUS
INTRAVENOUS | Status: DC | PRN
  Administered 2021-03-04: 100 mg via INTRAVENOUS

## 2021-03-04 MED ORDER — LIDOCAINE 2% (20 MG/ML) 5 ML SYRINGE
INTRAMUSCULAR | Status: AC
  Filled 2021-03-04: qty 5

## 2021-03-04 SURGICAL SUPPLY — 100 items
APPLICATOR COTTON TIP 6 STRL (MISCELLANEOUS) ×2 IMPLANT
APPLICATOR COTTON TIP 6IN STRL (MISCELLANEOUS) ×3
APPLICATOR SURGIFLO ENDO (HEMOSTASIS) IMPLANT
BAG LAPAROSCOPIC 12 15 PORT 16 (BASKET) ×2 IMPLANT
BAG RETRIEVAL 12/15 (BASKET) ×3
BAG URO CATCHER STRL LF (MISCELLANEOUS) ×3 IMPLANT
BENZOIN TINCTURE PRP APPL 2/3 (GAUZE/BANDAGES/DRESSINGS) IMPLANT
BLADE SURG SZ10 CARB STEEL (BLADE) IMPLANT
CATH SILICONE 5CC 18FR (INSTRUMENTS) ×3 IMPLANT
CELLS DAT CNTRL 66122 CELL SVR (MISCELLANEOUS) ×2 IMPLANT
CHLORAPREP W/TINT 26 (MISCELLANEOUS) ×3 IMPLANT
CLIP VESOLOCK LG 6/CT PURPLE (CLIP) ×9 IMPLANT
CLIP VESOLOCK MED LG 6/CT (CLIP) ×3 IMPLANT
CLIP VESOLOCK XL 6/CT (CLIP) ×9 IMPLANT
CLOTH BEACON ORANGE TIMEOUT ST (SAFETY) ×3 IMPLANT
CNTNR URN SCR LID CUP LEK RST (MISCELLANEOUS) ×2 IMPLANT
CONT SPEC 4OZ STRL OR WHT (MISCELLANEOUS) ×3
COVER SURGICAL LIGHT HANDLE (MISCELLANEOUS) ×3 IMPLANT
COVER TIP SHEARS 8 DVNC (MISCELLANEOUS) ×2 IMPLANT
COVER TIP SHEARS 8MM DA VINCI (MISCELLANEOUS) ×3
COVER WAND RF STERILE (DRAPES) IMPLANT
DECANTER SPIKE VIAL GLASS SM (MISCELLANEOUS) ×3 IMPLANT
DERMABOND ADVANCED (GAUZE/BANDAGES/DRESSINGS) ×1
DERMABOND ADVANCED .7 DNX12 (GAUZE/BANDAGES/DRESSINGS) ×2 IMPLANT
DRAIN CHANNEL RND F F (WOUND CARE) IMPLANT
DRAIN PENROSE 0.5X18 (DRAIN) IMPLANT
DRAPE ARM DVNC X/XI (DISPOSABLE) ×8 IMPLANT
DRAPE COLUMN DVNC XI (DISPOSABLE) ×2 IMPLANT
DRAPE DA VINCI XI ARM (DISPOSABLE) ×12
DRAPE DA VINCI XI COLUMN (DISPOSABLE) ×3
DRAPE SURG IRRIG POUCH 19X23 (DRAPES) ×3 IMPLANT
DRSG TEGADERM 4X4.75 (GAUZE/BANDAGES/DRESSINGS) ×3 IMPLANT
ELECT PENCIL ROCKER SW 15FT (MISCELLANEOUS) ×6 IMPLANT
ELECT REM PT RETURN 15FT ADLT (MISCELLANEOUS) ×3 IMPLANT
GAUZE 4X4 16PLY RFD (DISPOSABLE) IMPLANT
GLOVE SURG ENC MOIS LTX SZ6.5 (GLOVE) ×6 IMPLANT
GLOVE SURG ENC TEXT LTX SZ7.5 (GLOVE) ×9 IMPLANT
GLOVE SURG UNDER POLY LF SZ7.5 (GLOVE) IMPLANT
GOWN STRL REUS W/TWL LRG LVL3 (GOWN DISPOSABLE) ×15 IMPLANT
IRRIG SUCT STRYKERFLOW 2 WTIP (MISCELLANEOUS) ×3
IRRIGATION SUCT STRKRFLW 2 WTP (MISCELLANEOUS) ×2 IMPLANT
KIT PROCEDURE DA VINCI SI (MISCELLANEOUS)
KIT PROCEDURE DVNC SI (MISCELLANEOUS) IMPLANT
KIT TURNOVER KIT A (KITS) ×3 IMPLANT
LOOP VESSEL MAXI BLUE (MISCELLANEOUS) ×3 IMPLANT
MANIFOLD NEPTUNE II (INSTRUMENTS) ×3 IMPLANT
NEEDLE ASPIRATION 22 (NEEDLE) ×3 IMPLANT
NEEDLE INSUFFLATION 14GA 120MM (NEEDLE) ×3 IMPLANT
PACK CYSTO (CUSTOM PROCEDURE TRAY) ×3 IMPLANT
PACK ROBOT UROLOGY CUSTOM (CUSTOM PROCEDURE TRAY) ×3 IMPLANT
PAD POSITIONING PINK XL (MISCELLANEOUS) ×3 IMPLANT
PORT ACCESS TROCAR AIRSEAL 12 (TROCAR) ×2 IMPLANT
PORT ACCESS TROCAR AIRSEAL 5M (TROCAR) ×1
RELOAD STAPLER GREEN 60MM (STAPLE) ×10 IMPLANT
RELOAD STAPLER WHITE 60MM (STAPLE) ×12 IMPLANT
RETRACTOR LONRSTAR 16.6X16.6CM (MISCELLANEOUS) ×2 IMPLANT
RETRACTOR STAY HOOK 5MM (MISCELLANEOUS) ×3 IMPLANT
RETRACTOR STER APS 16.6X16.6CM (MISCELLANEOUS) ×3
RTRCTR WOUND ALEXIS 18CM MED (MISCELLANEOUS) ×3
SEAL CANN UNIV 5-8 DVNC XI (MISCELLANEOUS) ×8 IMPLANT
SEAL XI 5MM-8MM UNIVERSAL (MISCELLANEOUS) ×12
SET TRI-LUMEN FLTR TB AIRSEAL (TUBING) ×3 IMPLANT
SOLUTION ELECTROLUBE (MISCELLANEOUS) ×3 IMPLANT
SPONGE LAP 18X18 RF (DISPOSABLE) ×6 IMPLANT
SPONGE LAP 4X18 RFD (DISPOSABLE) ×3 IMPLANT
STAPLER ECHELON LONG 60 440 (INSTRUMENTS) ×3 IMPLANT
STAPLER RELOAD GREEN 60MM (STAPLE) ×15
STAPLER RELOAD WHITE 60MM (STAPLE) ×18
STENT SET URETHERAL LEFT 7FR (STENTS) ×3 IMPLANT
STENT SET URETHERAL RIGHT 7FR (STENTS) ×3 IMPLANT
SURGIFLO W/THROMBIN 8M KIT (HEMOSTASIS) IMPLANT
SUT CHROMIC 4 0 RB 1X27 (SUTURE) ×3 IMPLANT
SUT ETHILON 3 0 PS 1 (SUTURE) ×3 IMPLANT
SUT MNCRL AB 4-0 PS2 18 (SUTURE) ×6 IMPLANT
SUT PDS AB 0 CTX 36 PDP370T (SUTURE) ×9 IMPLANT
SUT SILK 3 0 SH 30 (SUTURE) IMPLANT
SUT SILK 3 0 SH CR/8 (SUTURE) ×3 IMPLANT
SUT V-LOC BARB 180 2/0GR6 GS22 (SUTURE) ×6
SUT VIC AB 2-0 CT1 27 (SUTURE)
SUT VIC AB 2-0 CT1 27XBRD (SUTURE) IMPLANT
SUT VIC AB 2-0 SH 18 (SUTURE) IMPLANT
SUT VIC AB 2-0 UR5 27 (SUTURE) ×12 IMPLANT
SUT VIC AB 3-0 CT2 27 (SUTURE) ×3 IMPLANT
SUT VIC AB 3-0 SH 27 (SUTURE) ×18
SUT VIC AB 3-0 SH 27X BRD (SUTURE) ×4 IMPLANT
SUT VIC AB 3-0 SH 27XBRD (SUTURE) ×8 IMPLANT
SUT VIC AB 4-0 RB1 27 (SUTURE) ×12
SUT VIC AB 4-0 RB1 27XBRD (SUTURE) ×8 IMPLANT
SUT VLOC BARB 180 ABS3/0GR12 (SUTURE) ×3
SUTURE V-LC BRB 180 2/0GR6GS22 (SUTURE) ×4 IMPLANT
SUTURE VLOC BRB 180 ABS3/0GR12 (SUTURE) ×2 IMPLANT
SYR CONTROL 10ML LL (SYRINGE) IMPLANT
SYSTEM UROSTOMY GENTLE TOUCH (WOUND CARE) ×3 IMPLANT
TOWEL OR NON WOVEN STRL DISP B (DISPOSABLE) ×3 IMPLANT
TROCAR BLADELESS 15MM (ENDOMECHANICALS) ×3 IMPLANT
TROCAR XCEL NON-BLD 5MMX100MML (ENDOMECHANICALS) IMPLANT
TUBING CONNECTING 10 (TUBING) ×3 IMPLANT
WATER STERILE IRR 1000ML POUR (IV SOLUTION) ×3 IMPLANT
WATER STERILE IRR 3000ML UROMA (IV SOLUTION) ×3 IMPLANT
YANKAUER SUCT BULB TIP 10FT TU (MISCELLANEOUS) ×3 IMPLANT

## 2021-03-04 NOTE — Progress Notes (Signed)
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 Initial MRI clinicall localized and path urothelial with 50% Sq differentiation and some vaginal wall inovlvement anterioorly (T4). Undewent neopadjuvant gem-cis x 4 cycles via port under care of Dr. Alen Blew finishing early 12/2020. Restagign CT chest/abd/pelvis with come decrease in local mass and no distant disease. She remains with catheter for comfort.   Today " Sandra Mann " is seen to proceed with pelvic exenteration for uretrhal cancer. She completed bowel prep to clear, has stomal marking. C19 screen negative. Hgb 11's.      Objective: Vital signs in last 24 hours: Temp:  [97.6 F (36.4 C)-98.2 F (36.8 C)] 98.1 F (36.7 C) (05/04 0650) Pulse Rate:  [62-69] 65 (05/04 0651) Resp:  [16-18] 16 (05/04 0651) BP: (128-141)/(57-72) 130/72 (05/04 0651) SpO2:  [96 %-100 %] 97 % (05/04 0651) Last BM Date: 03/02/21  Intake/Output from previous day: 05/03 0701 - 05/04 0700 In: 497.5 [I.V.:497.5] Out: 2200 [Urine:2200] Intake/Output this shift: No intake/output data recorded.  Physical Exam Vitals reviewed.  HENT:     Head: Normocephalic.     Mouth/Throat:     Mouth: Mucous membranes are moist.  Eyes:     Pupils: Pupils are equal, round, and reactive to light.  Cardiovascular:     Rate and Rhythm: Normal rate.     Pulses: Normal pulses.  Pulmonary:     Effort: Pulmonary effort is normal.  Abdominal:     General: Abdomen is flat.     Comments: Stomal marking site noted.   Genitourinary:    Comments: In situ catheter with non-foul urine.  Musculoskeletal:        General: Normal range of motion.     Cervical back: Normal range of motion.     Comments: Some LUE pain c/w known arthritis flair.   Skin:    General: Skin is warm.     Capillary Refill: Capillary refill takes less than 2 seconds.  Neurological:     Mental Status: She is alert.   Psychiatric:        Mood and Affect: Mood normal. Appropriately anxious.   Lab Results:  Recent Labs    03/03/21 1713  WBC 8.8  HGB 11.7*  HCT 33.9*  PLT 198   BMET Recent Labs    03/03/21 1713  NA 137  K 3.7  CL 104  CO2 23  GLUCOSE 93  BUN 16  CREATININE 0.84  CALCIUM 9.3   PT/INR No results for input(s): LABPROT, INR in the last 72 hours. ABG No results for input(s): PHART, HCO3 in the last 72 hours.  Invalid input(s): PCO2, PO2  Studies/Results: No results found.  Anti-infectives: Anti-infectives (From admission, onward)   Start     Dose/Rate Route Frequency Ordered Stop   03/04/21 0600  piperacillin-tazobactam (ZOSYN) IVPB 3.375 g        3.375 g 100 mL/hr over 30 Minutes Intravenous 30 min pre-op 03/03/21 1643     03/03/21 1800  neomycin (MYCIFRADIN) tablet 1,000 mg        1,000 mg Oral Every 4 hours 03/03/21 1645 03/03/21 2238   03/03/21 1800  metroNIDAZOLE (FLAGYL) tablet 500 mg        500 mg Oral Every 4 hours 03/03/21 1645 03/03/21 2238      Assessment/Plan:  Proceed as planned with anterior pelvic exent (TAH, BSO, Cystectomy, Node Dissection, Conduit Diversion, Cysto-ICG injection). Risks, benefits,  alternatives, expected peri-op course discussed with pt and husband extensively over several visits previously and reiterated with pt again today.   Alexis Frock 03/04/2021

## 2021-03-04 NOTE — Anesthesia Postprocedure Evaluation (Signed)
Anesthesia Post Note  Patient: OSIRIS CHARLES  Procedure(s) Performed: XI ROBOTIC ASSISTED LAPAROSCOPIC COMPLETE CYSTECT ILEAL CONDUIT (N/A ) XI ROBOTIC ASSISTED LAPAROSCOPIC HYSTERECTOMY AND SALPINGECTOMY (Bilateral ) LYMPH NODE DISSECTION (Bilateral ) CYSTOSCOPY WITH INJECTION OF INDOCYANINE GREEN DYE (N/A )     Patient location during evaluation: PACU Anesthesia Type: General Level of consciousness: awake and alert Pain management: pain level controlled Vital Signs Assessment: post-procedure vital signs reviewed and stable Respiratory status: spontaneous breathing, nonlabored ventilation and respiratory function stable Cardiovascular status: blood pressure returned to baseline and stable Postop Assessment: no apparent nausea or vomiting Anesthetic complications: no   No complications documented.  Last Vitals:  Vitals:   03/04/21 1430 03/04/21 1445  BP: 121/63 128/60  Pulse: (!) 56 (!) 50  Resp: 13 20  Temp: (!) 36.2 C (!) 36.2 C  SpO2: 99% 96%    Last Pain:  Vitals:   03/04/21 1445  TempSrc:   PainSc: 7                  Elyna Pangilinan,W. EDMOND

## 2021-03-04 NOTE — Anesthesia Procedure Notes (Signed)
Procedure Name: Intubation Date/Time: 03/04/2021 8:44 AM Performed by: Claudia Desanctis, CRNA Pre-anesthesia Checklist: Patient identified, Emergency Drugs available, Suction available and Patient being monitored Patient Re-evaluated:Patient Re-evaluated prior to induction Oxygen Delivery Method: Circle system utilized Preoxygenation: Pre-oxygenation with 100% oxygen Induction Type: IV induction Ventilation: Mask ventilation without difficulty Laryngoscope Size: 2 and Miller Grade View: Grade I Tube type: Oral Tube size: 7.0 mm Number of attempts: 1 Airway Equipment and Method: Stylet Placement Confirmation: ETT inserted through vocal cords under direct vision,  positive ETCO2 and breath sounds checked- equal and bilateral Secured at: 21 cm Tube secured with: Tape Dental Injury: Teeth and Oropharynx as per pre-operative assessment

## 2021-03-04 NOTE — Anesthesia Procedure Notes (Signed)
Arterial Line Insertion Start/End5/01/2021 8:05 AM, 03/04/2021 8:10 AM Performed by: Roderic Palau, MD  Patient location: Pre-op. Preanesthetic checklist: patient identified, IV checked, site marked, risks and benefits discussed, surgical consent, monitors and equipment checked, pre-op evaluation, timeout performed and anesthesia consent Lidocaine 1% used for infiltration Left, radial was placed Catheter size: 20 Fr Hand hygiene performed , maximum sterile barriers used  and Seldinger technique used  Attempts: 1 Procedure performed without using ultrasound guided technique. Following insertion, dressing applied and Biopatch. Post procedure assessment: normal and unchanged  Patient tolerated the procedure well with no immediate complications.

## 2021-03-04 NOTE — Brief Op Note (Signed)
03/04/2021  1:57 PM  PATIENT:  Sandra Mann  70 y.o. female  PRE-OPERATIVE DIAGNOSIS:  BLADDER AND URETHRAL CANCER  POST-OPERATIVE DIAGNOSIS:  BLADDER AND URETHRAL CANCER  PROCEDURE:  Procedure(s) with comments: XI ROBOTIC ASSISTED LAPAROSCOPIC COMPLETE CYSTECT ILEAL CONDUIT (N/A) - 6.5 HRS XI ROBOTIC ASSISTED LAPAROSCOPIC HYSTERECTOMY AND SALPINGECTOMY (Bilateral) LYMPH NODE DISSECTION (Bilateral) CYSTOSCOPY WITH INJECTION OF INDOCYANINE GREEN DYE (N/A)  SURGEON:  Surgeon(s) and Role:    Alexis Frock, MD - Primary  PHYSICIAN ASSISTANT:   ASSISTANTS: Debbrah Alar PA   ANESTHESIA:   local and general  EBL:  100 mL   BLOOD ADMINISTERED:none  DRAINS: 1-  JP to bulb; 2 - RLQ Urostomy wtih Rt (red) and Lt (blue) bander stents   LOCAL MEDICATIONS USED:  MARCAINE     SPECIMEN:  Source of Specimen:  1- bladder + uretrha + anterior vaginal wall + cervix / utersu / ovaries; 2 - pelvic lymph nodes; 3 - ureteral margins  DISPOSITION OF SPECIMEN:  PATHOLOGY  COUNTS:  YES  TOURNIQUET:  * No tourniquets in log *  DICTATION: .Other Dictation: Dictation Number  [id number]  PLAN OF CARE: Admit to inpatient   PATIENT DISPOSITION:  PACU - hemodynamically stable.   Delay start of Pharmacological VTE agent (>24hrs) due to surgical blood loss or risk of bleeding: yes

## 2021-03-04 NOTE — Transfer of Care (Signed)
Immediate Anesthesia Transfer of Care Note  Patient: Sandra Mann  Procedure(s) Performed: XI ROBOTIC ASSISTED LAPAROSCOPIC COMPLETE CYSTECT ILEAL CONDUIT (N/A ) XI ROBOTIC ASSISTED LAPAROSCOPIC HYSTERECTOMY AND SALPINGECTOMY (Bilateral ) LYMPH NODE DISSECTION (Bilateral ) CYSTOSCOPY WITH INJECTION OF INDOCYANINE GREEN DYE (N/A )  Patient Location: PACU  Anesthesia Type:General  Level of Consciousness: awake and patient cooperative  Airway & Oxygen Therapy: Patient Spontanous Breathing and Patient connected to face mask  Post-op Assessment: Report given to RN and Post -op Vital signs reviewed and stable  Post vital signs: Reviewed and stable  Last Vitals:  Vitals Value Taken Time  BP 118/62 03/04/21 1408  Temp    Pulse 52 03/04/21 1411  Resp 10 03/04/21 1411  SpO2 100 % 03/04/21 1411  Vitals shown include unvalidated device data.  Last Pain:  Vitals:   03/03/21 2207  TempSrc:   PainSc: 4       Patients Stated Pain Goal: 2 (08/31/58 4585)  Complications: No complications documented.

## 2021-03-05 ENCOUNTER — Encounter (HOSPITAL_COMMUNITY): Payer: Self-pay | Admitting: Urology

## 2021-03-05 LAB — BASIC METABOLIC PANEL
Anion gap: 7 (ref 5–15)
BUN: 16 mg/dL (ref 8–23)
CO2: 24 mmol/L (ref 22–32)
Calcium: 7.9 mg/dL — ABNORMAL LOW (ref 8.9–10.3)
Chloride: 106 mmol/L (ref 98–111)
Creatinine, Ser: 0.92 mg/dL (ref 0.44–1.00)
GFR, Estimated: >60 mL/min (ref >60)
Glucose, Bld: 169 mg/dL — ABNORMAL HIGH (ref 70–99)
Potassium: 3.8 mmol/L (ref 3.5–5.1)
Sodium: 137 mmol/L (ref 135–145)

## 2021-03-05 LAB — HEMOGLOBIN AND HEMATOCRIT, BLOOD
HCT: 21.4 % — ABNORMAL LOW (ref 36.0–46.0)
Hemoglobin: 7.2 g/dL — ABNORMAL LOW (ref 12.0–15.0)

## 2021-03-05 MED ORDER — OXYCODONE HCL 5 MG PO TABS
5.0000 mg | ORAL_TABLET | ORAL | Status: DC | PRN
  Administered 2021-03-07 – 2021-03-10 (×14): 5 mg via ORAL
  Filled 2021-03-05 (×15): qty 1

## 2021-03-05 MED ORDER — ACETAMINOPHEN 10 MG/ML IV SOLN
1000.0000 mg | Freq: Four times a day (QID) | INTRAVENOUS | Status: AC
  Administered 2021-03-05 – 2021-03-06 (×4): 1000 mg via INTRAVENOUS
  Filled 2021-03-05 (×4): qty 100

## 2021-03-05 MED ORDER — HYDROMORPHONE HCL 1 MG/ML IJ SOLN
0.5000 mg | INTRAMUSCULAR | Status: DC | PRN
  Administered 2021-03-06 (×2): 0.5 mg via INTRAVENOUS
  Filled 2021-03-05 (×2): qty 1

## 2021-03-05 NOTE — Consult Note (Addendum)
[facility] Nurse ostomy consult note Stoma type/location: Pt had urostomy surgery performed on 5/4.  Pouch change procedure demonstrated to patient and husband using a hand held mirror.  Stomal assessment/size: Stoma is red and viable; 1 1/4 inches, above skin level, 2 stints in place. There is a slight valley surrounding the stoma when the patient leans forward. Current flat pouch was leaking behind the barrier.  Peristomal assessment: Intact skin surrounding; located in close proximity to the surgical site which is closed with medical adhesive.  Output: mod amt cloudy yellow urine Ostomy pouching: 1pc Education provided:  Demonstrated pouch change using a barrier ring and one piece convex pouch to attempt to maintain a seal.  Pt was able to open and close spout to empty and attach and remove bedside drainage bag.  Discussed pouching routines and ordering supplies.  Pt asked appropriate questions.  4 sets of barrier rings, 4 convex pouches, and 1 belt left at the bedside, along with educational materials. Use Supplies: barrier ring, [place] # [id number], and convex Roselee Culver # [phone] Enrolled patient in [place] program: Yes [place] team will continue to follow for further a teaching session on Monday if patient is still in the hospital at that time.  Pt could benefit from home health assistance; please order if desired.  Julien Girt MSN, RN, [place], [place], [place]

## 2021-03-05 NOTE — TOC Initial Note (Addendum)
Transition of Care [facility]) - Initial/Assessment Note    Patient Details  Name: Sandra Mann MRN: [id number] Date of Birth: 1950-12-18  Transition of [facility]) CM/SW Contact:    Dessa Phi, RN Phone Number: 03/05/2021, 1:56 PM  Clinical Narrative: NO preference for HHC/ agency or dme company-[employer] chosen for HHRN/HHPT rep [employer] rep Thedore Mins to deliver rw to rm prior d/c.                4:30p-[employer] unale to provide HHRN-will check on another [facility] agency.  Expected Discharge Plan: [place] Barriers to Discharge: Continued Medical Work up   Patient Goals and CMS Choice Patient states their goals for this hospitalization and ongoing recovery are:: go home CMS Medicare.gov Compare Post Acute Care list provided to:: Patient Represenative (must comment) Choice offered to / list presented to : Spouse  Expected Discharge Plan and Services Expected Discharge Plan: [place]   Discharge Planning Services: CM Consult Post Acute Care Choice: [place] arrangements for the past 2 months: Single Family Home                 DME Arranged: [place] rolling DME Agency: [employer] Date DME Agency Contacted: 03/05/21 Time DME Agency Contacted: 0737 Representative spoke with at DME Agency: Thedore Mins HH Arranged: RN,PT  Date [place]: 03/05/21 Time [place]: 4 Representative spoke with at [place]: Tommi Rumps  Prior Living Arrangements/Services Living arrangements for the past 2 months: [place] with:: Spouse Patient language and need for interpreter reviewed:: Yes Do you feel safe going back to the place where you live?: Yes      Need for Family Participation in Patient Care: No (Comment) Care giver support system in place?: Yes (comment)   Criminal Activity/Legal Involvement Pertinent to Current Situation/Hospitalization: No - Comment as needed  Activities of Daily Living Home Assistive  Devices/Equipment: None ADL Screening (condition at time of admission) Patient's cognitive ability adequate to safely complete daily activities?: No Is the patient deaf or have difficulty hearing?: No Does the patient have difficulty seeing, even when wearing glasses/contacts?: No Does the patient have difficulty concentrating, remembering, or making decisions?: No Patient able to express need for assistance with ADLs?: Yes Does the patient have difficulty dressing or bathing?: No Independently performs ADLs?: Yes (appropriate for developmental age) Does the patient have difficulty walking or climbing stairs?: No Weakness of Legs: None Weakness of Arms/Hands: None  Permission Sought/Granted Permission sought to share information with : Case Manager Permission granted to share information with : Yes, Verbal Permission Granted  Share Information with NAME: Case Manager           Emotional Assessment Appearance:: Appears stated age Attitude/Demeanor/Rapport: Gracious Affect (typically observed): Accepting Orientation: : Oriented to Self,Oriented to Place,Oriented to  Time,Oriented to Situation Alcohol / Substance Use: Not Applicable Psych Involvement: No (comment)  Admission diagnosis:  Urethral carcinoma ([place]) [C68.0] Patient Active Problem List   Diagnosis Date Noted  . Urethral carcinoma ([place]) 03/04/2021  . Port-A-Cath in place 01/14/2021  . Urethral CA ([place]) 10/10/2020  . Infection of mandible 05/29/2014  . 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 [place]  [place] [address] Phone: [phone] Fax: [phone]     Social Determinants of Health (SDOH) Interventions    Readmission Risk  Interventions No flowsheet data found.

## 2021-03-05 NOTE — Progress Notes (Signed)
1 Day Post-Op   Subjective/Chief Complaint:  1 - Muscle Invasive Bladder-Urethral Cancer - s/p robotic cystectomy, TAH/BSO, node dissection, conduit urinary diversion on 03/04/2021. Path pending. Admitted 5/3 for bowel prep and stomal marking.  2 - Post-OP Ileus - bowel anastomosis as part of surgery 03/04/21. Entereg peri-op. NPO with ice chips POD 1.   3 - Disposition / Rehab - independent in all ADL's at baseline. Ostomy RN's working with in house, PT eval pending.  Today "Pam" is stable. Some anemia by AM labs, but no tachycardia.    Objective: Vital signs in last 24 hours: Temp:  [96.3 F (35.7 C)-99.1 F (37.3 C)] 99.1 F (37.3 C) (05/05 0528) Pulse Rate:  [47-78] 71 (05/05 0528) Resp:  [9-20] 16 (05/05 0528) BP: (101-128)/(51-67) 118/51 (05/05 0528) SpO2:  [95 %-100 %] 98 % (05/05 0528) Arterial Line BP: (139)/(50) 139/50 (05/04 1415) Last BM Date: 03/04/21  Intake/Output from previous day: 05/04 0701 - 05/05 0700 In: 3979.4 [I.V.:3585.5; IV Piggyback:393.9] Out: 830 [Urine:600; Drains:130; Blood:100] Intake/Output this shift: No intake/output data recorded.  EXAM: NAD, AOx3 Non-labored breathing on minimal [place] O2 Regular heart rate Recent surgical sites c/d/i on abdomin. RLQ Urostomy pink/patent with Rt (red) and Lt (blue) bander stents and non-foul urine with scant mucus. JP with non-foul serosanguinous fluid Vaginal pad with non-foul serosanguinous fluid. No c/c/e.   Lab Results:  Recent Labs    03/03/21 1713 03/04/21 1458 03/05/21 0452  WBC 8.8  --   --   HGB 11.7* 9.7* 7.2*  HCT 33.9* 29.1* 21.4*  PLT 198  --   --    BMET Recent Labs    03/03/21 1713 03/05/21 0452  NA 137 137  K 3.7 3.8  CL 104 106  CO2 23 24  GLUCOSE 93 169*  BUN 16 16  CREATININE 0.84 0.92  CALCIUM 9.3 7.9*   PT/INR No results for input(s): LABPROT, INR in the last 72 hours. ABG No results for input(s): PHART, HCO3 in the last 72 hours.  Invalid input(s): PCO2,  PO2  Studies/Results: No results found.  Anti-infectives: Anti-infectives (From admission, onward)   Start     Dose/Rate Route Frequency Ordered Stop   03/04/21 1800  piperacillin-tazobactam (ZOSYN) IVPB 3.375 g        3.375 g 12.5 mL/hr over 240 Minutes Intravenous Every 8 hours 03/04/21 1749 03/05/21 1759   03/04/21 0600  piperacillin-tazobactam (ZOSYN) IVPB 3.375 g        3.375 g 100 mL/hr over 30 Minutes Intravenous 30 min pre-op 03/03/21 1643 03/04/21 0856   03/03/21 1800  neomycin (MYCIFRADIN) tablet 1,000 mg        1,000 mg Oral Every 4 hours 03/03/21 1645 03/03/21 2238   03/03/21 1800  metroNIDAZOLE (FLAGYL) tablet 500 mg        500 mg Oral Every 4 hours 03/03/21 1645 03/03/21 2238      Assessment/Plan:  1 - Muscle Invasive Bladder-Urethral Cancer - path pending. No furhter cancer-directed care this admission. H/H again tomorrow, consider gentle transfusion if Hgb <7.  2 - Post-OP Ileus - ice chips today. Clears tomorrow as long as no sig emesis.Continue sennakot.    3 - Disposition / Rehab - appreciate ostomy RN help. PT eval today.   Remain in house.   Alexis Frock 03/05/2021

## 2021-03-05 NOTE — Evaluation (Signed)
Physical Therapy Evaluation Patient Details Name: Sandra Mann MRN: [id number] DOB: 12/07/1950 Today's Date: 03/05/2021   History of Present Illness  Pt is 70 yo female admitted on 03/03/21 with muscle invasive bladder-urethral CA, s/p cystoscopy, robotic laparoscopic radical cystectomy, bil pelvic lymphadenectomy, hysterectomy, bil salping-oophorectomy, and ileal conduit urinary diversion on 03/04/21 with post op ileus.  She has medical hx of GERD, RA, ureteral CA  Clinical Impression  Pt admitted with above diagnosis. She was able to transfer and ambulate 25'x2 with RW and min A.  Required min A to steady and cues for safety.  Pt is normally very independent and active, she lives with spouse who farms but is nearby if needed at discharge.  Pt currently with functional limitations due to the deficits listed below (see PT Problem List). Pt will benefit from skilled PT to increase their independence and safety with mobility to allow discharge to the venue listed below.       Follow Up Recommendations Home health PT;Supervision for mobility/OOB (HHPT pending progress)    Equipment Recommendations  Rolling walker with 5" wheels    Recommendations for Other Services       Precautions / Restrictions Precautions Precautions: Fall Precaution Comments: JP drain and ostomy      Mobility  Bed Mobility Overal bed mobility: Needs Assistance Bed Mobility: Supine to Sit     Supine to sit: Min assist     General bed mobility comments: Min A to lift trunk    Transfers Overall transfer level: Needs assistance Equipment used: Rolling walker (2 wheeled) Transfers: Sit to/from Stand Sit to Stand: Min assist         General transfer comment: Min A to rise and steady; performed x 2  Ambulation/Gait Ambulation/Gait assistance: Min assist Gait Distance (Feet): 25 Feet (25'x2) Assistive device: Rolling walker (2 wheeled) Gait Pattern/deviations: Step-to pattern;Decreased stride length;Trunk  flexed Gait velocity: decreased   General Gait Details: 25'x2 with standing rest break, mild unsteadiness requring minA, mild impulsivity requiring cues for safey, direction, and RW proximity  Science writer    Modified Rankin (Stroke Patients Only)       Balance Overall balance assessment: Needs assistance Sitting-balance support: No upper extremity supported Sitting balance-Leahy Scale: Good     Standing balance support: Bilateral upper extremity supported Standing balance-Leahy Scale: Poor Standing balance comment: requiried RW                             Pertinent Vitals/Pain Pain Assessment: 0-10 Pain Score: 2  Pain Location: abdomen Pain Descriptors / Indicators: Discomfort Pain Intervention(s): Limited activity within patient's tolerance;Monitored during session    Home Living Family/patient expects to be discharged to:: Private residence Living Arrangements: Spouse/significant other Available Help at Discharge: Family;Available PRN/intermittently (Spouse farms but close if needed) Type of Home: House Home Access: Stairs to enter Entrance Stairs-Rails: None Entrance Stairs-Number of Steps: 1 Home Layout: One level Home Equipment: Grab bars - tub/shower      Prior Function Level of Independence: Independent         Comments: Pt very independent -reports enjoys going to grandkids ball games     Hand Dominance        Extremity/Trunk Assessment   Upper Extremity Assessment Upper Extremity Assessment: Overall WFL for tasks assessed    Lower Extremity Assessment Lower Extremity Assessment: Overall WFL for tasks assessed  Cervical / Trunk Assessment Cervical / Trunk Assessment: Other exceptions Cervical / Trunk Exceptions: forward trunk and head due to pain  Communication   Communication: No difficulties  Cognition Arousal/Alertness: Awake/alert Behavior During Therapy: Impulsive Overall Cognitive  Status: Within Functional Limits for tasks assessed                                        General Comments General comments (skin integrity, edema, etc.): VSS    Exercises     Assessment/Plan    PT Assessment Patient needs continued PT services  PT Problem List Decreased strength;Decreased mobility;Decreased safety awareness;Decreased activity tolerance;Decreased balance;Pain;Decreased knowledge of use of DME       PT Treatment Interventions DME instruction;Therapeutic activities;Gait training;Therapeutic exercise;Patient/family education;Modalities;Balance training;Stair training;Functional mobility training    PT Goals (Current goals can be found in the Care Plan section)  Acute Rehab PT Goals Patient Stated Goal: return home PT Goal Formulation: With patient/family Time For Goal Achievement: 03/19/21 Potential to Achieve Goals: Good    Frequency Min 3X/week   Barriers to discharge        Co-evaluation               AM-PAC PT "6 Clicks" Mobility  Outcome Measure Help needed turning from your back to your side while in a flat bed without using bedrails?: None Help needed moving from lying on your back to sitting on the side of a flat bed without using bedrails?: A Little Help needed moving to and from a bed to a chair (including a wheelchair)?: A Little Help needed standing up from a chair using your arms (e.g., wheelchair or bedside chair)?: A Little Help needed to walk in hospital room?: A Little Help needed climbing 3-5 steps with a railing? : A Little 6 Click Score: 19    End of Session   Activity Tolerance: Patient tolerated treatment well Patient left: in bed;with call bell/phone within reach;with bed alarm set;with family/visitor present Nurse Communication: Mobility status PT Visit Diagnosis: Other abnormalities of gait and mobility (R26.89)    Time: 2376-2831 PT Time Calculation (min) (ACUTE ONLY): 26 min   Charges:   PT  Evaluation $PT Eval Moderate Complexity: 1 Mod PT Treatments $Gait Training: 8-22 mins        [name], PT Acute Rehab Services Pager [phone] [facility] [phone]    [name] 03/05/2021, 12:33 PM

## 2021-03-05 NOTE — Op Note (Signed)
NAMEPATSYE, SULLIVANT MEDICAL RECORD NO: [id number] ACCOUNT NO: [id number] DATE OF BIRTH: 07-18-1951 FACILITY: [facility] LOCATION: [id number] PHYSICIAN: Alexis Frock, MD  Operative Report   DATE OF PROCEDURE: 03/04/2021  PREOPERATIVE DIAGNOSIS:  High-grade urethral cancer.  PROCEDURE PERFORMED: 1. Cystoscopy with injection of indocyanine green dye. 2.  Robotic-assisted laparoscopic radical cystectomy. 3.  Bilateral pelvic lymphadenectomy. 3.  Hysterectomy, bilateral salpingo-oophorectomy. 4.  Ileal conduit urinary diversion.  ESTIMATED BLOOD LOSS:  100 mL.  ASSISTANT:  Debbrah Alar, PA  FINDINGS: 1.  Sentinel lymph nodes in the right hemipelvis noted on pathology requisition.   DRAINS: 1.  Jackson-Pratt drain to bulb suction. 2.  Right lower quadrant urostomy to gravity drainage with right (red), left (blue) Bander stents.  INDICATIONS:  The patient is a very pleasant 70 year old woman with a recent history of very large volume, high-grade primary urethral cancer.  This is clinically stage III to IV based on vaginal wall invasion by MRI.  This is a urothelial based cancer.   She underwent neoadjuvant chemotherapy, which she tolerated quite well.  Restaging imaging revealed no disease progression and certainly appeared resectable.  The options were discussed including continued curative intent path, next with anterior pelvic  exenteration and she wished to proceed.  She has been holding her immune modulators.  She was admitted yesterday for bowel prep and labs, stoma marking.  DESCRIPTION IN DETAIL:  The patient is being herself, the procedure being anterior pelvic exenteration urinary diversion was confirmed.  Procedure timeout was performed.  Intravenous access administered and general endotracheal anesthesia induced.  The  patient was placed in the low lithotomy position.  Sterile field was created, prepped and draped in the patient's vagina, introitus and proximal thighs using iodine  and infraxiphoid abdomen using chlorhexidine gluconate after she was further fashioned on  the operating table using 3-inch tape with foam padding across the supraxiphoid chest.  Her arms were tucked to the side with gel rolls.  A test for steep Trendelenburg positioning was performed and she was found to be suitably positioned.  An LAVH type  drape was applied.  Cystourethroscopy was performed with a 24-French injection scope set with 0-degree lens.  Inspection of the urinary bladder revealed some mild cystitis cystica changes in the dome areas of the chronic catheter and some papillary and  nodular areas in the bladder neck and a more proximal urethra consistent with known cancer.  Notably, with bimanual exam, this was not through and through the anterior vaginal wall, but there was some firmness and nodularity consistent with likely some  superficial vaginal invasion from the deep aspect, but not protruding into the mucosal surface.  Next, 2 mL of the indocyanine green dye was injected across several mucosal blebs in the intertrigone and very distal bladder neck proximal urethral area.  A  new silicone Foley catheter was placed free to straight drain.  A U-type vaginal retractor was employed with 4 stay hooks and distalmost external urethral dissection was performed circumscribing the urethra as widely as possible for reconstruction on  its anterior 75% circumference and carried down such that the arch of the pubis could be palpated anteriorly and laterally.  This was carefully carried down lateral to the aspect of the distal anterior vaginal wall.  Again, this was performed via  inferior most extent of dissection to verify, this was completely removed.  Hemostasis was excellent.  Surgical sponge was placed per vagina.  Surgeon assistant regowned.  High flow, low pressure pneumoperitoneum was obtained using  Veress technique in  the supraumbilical midline having passed the aspiration and drop test.  An 8  mm robotic camera port was then placed in the same location.  Laparoscopic examination of the peritoneal cavity revealed no significant adhesions, no visceral injury.   Additional ports were placed as follows:  Right paramedian 8 mm robotic port, right paramedian 15 mm assistant port at the previously marked urostomy site, right far lateral 12 mm AirSeal assistant port, left paramedian 8 mm robotic port, left far  lateral 8 mm robotic port.  Robot was docked and passed the electronic checks.  Next, attention was directed to left retroperitoneal dissection.  Incision was made lateral to the descending colon from the area of the iliac vessels superiorly, which was  approximately 8 cm distally just lateral to the left medial umbilical ligament.  This created a large retroperitoneal flap on the left side and was used to retract the colon medially.  The left gonadal vessels were encountered, circumferentially  mobilized controlled using 2 Hem-o-Lok clip up one distal and left ureter was encountered, circumferentially mobilized and marked with a vessel loop and dissected proximally for a distance of approximately 8 cm above the iliac crossing and then distally  to the ureterovesical junction, which was doubly clipped and ligated with a proximal clip containing a dyed tag suture.  Frozen section negative for carcinoma. Left ureter was then tucked out of the true pelvis.  The left bladder wall was swept away from  the pelvic sidewall towards the area of the endopelvic fascia on the left side.  This exposed the pelvic lymph node fields from left side.  Attention was directed at lymphadenectomy on the left.  First, the left external group was dissected free with  the boundaries being the left external iliac artery, vein, pelvic sidewall, iliac bifurcation.  Lymphostasis achieved with cold clips.  Next, left obturator group was dissected free with the boundaries being left external iliac vein, pelvic sidewall,   obturator nerve.  Lymphostasis achieved with cold clips.  Left obturator was inspected following maneuvers and found to be uninjured. Notably, the left internal group had been dissected with the external.  Next, the left common group dissected free with  the boundaries being the aortic bifurcation, iliac bifurcation.  Lymphostasis achieved with cold clips, set aside, labeled as left common iliac lymph nodes.  Attention was then directed to the right sided retroperitoneal dissection.  The ileocecal  junction area was identified and the terminal ileum was traced proximally for a distance of approximately 14 cm and  marked with a silk tagged suture on the antimesenteric border with a clip distal to this.  There was no proximal disorientation and later  conduit formation.  Incision was made lateral to the cecum superiorly for a distance of approximately 6 cm inferiorly coursing lateral to the right medial umbilical ligament was performed.  The large retroperitoneal flap on the right side, which was  used to retract the bowel medially.  The right gonadal vessels were encountered, doubly clipped proximally, once distally and ligated.  The right ureter was similarly encountered marked to the vessel loop and circumferentially mobilized proximally for  distance of approximately 6 cm above the iliac crossing it distally to the ureterovesical junction which was doubly clipped and ligated. Proximal clip containing a white tag suture.  Frozen section negative for carcinoma.  This was tucked out of true  pelvis on the right side and the right bladder wall was swept away from the pelvic  sidewall towards the area of the endopelvic fascia on the right side.  This exposed the pelvic lymph nodes on the right and lymphadenectomy was performed as mirrior image of the right  external, right obturator, right internal iliac, right common iliac lymph nodes respectively.  The peritoneal incision was then extended in a Y-shaped  fashion towards the area of the aortic bifurcation.  Additional lymph nodes were taken from the area of  the aortic bifurcation labeled as such.  Via the retroperitoneal window just anterior to the aorta, the left ureter was visualized and brought through in retroperitoneal fashion in the right ureter, left ureter, terminal ileum.  Tagged sutures were  placed into a single Hem-o-Lok clip for later manipulation.  Attention was directed to the posterior dissection.  A sponge stick was placed per vagina to denote the posterior vaginal fornix. Incision was made directly onto this and the vaginal incision  was continued inferiorly sparing only the posterior vaginal wall towards the area of the introitus.  This exposed the vascular pedicles of the bladder and the uterus which was controlled with white load stapler x2 each side, resulted in excellent  hemostasis of the bladder and uterine pedicles.  Anterior dissection was performed by developing the space of Retzius between the medial umbilical ligaments towards the area of the anterior urethra.  Again, very wide dissection was performed in distal  aspect and we entered the plane that we had initially made the inferior approach.  This completely freed up the large urethra with the meatus, catheter, bladder, uterus, ovaries, anterior vaginal wall and cervix specimen which was placed in a extra large  EndoCatch bag for later retrieval.  Attention was directed at the vaginal cuff reconstruction.  A 2-0 V-Loc suture was used in a clamshell type fashion to bring the anterior vaginal wall over the defect to the 12 o'clock vaginal position.  This created  2 separate running suture lines, which were very carefully reapproximated using purposely full thickness bites V-Loc suture resulting in excellent vaginal cuff formation.  Vaginal exam was performed using indicator glove and no evidence of mucosal  violation was noted.  This completed the extirpated portion of the  procedure today.  Sponge and needle counts were correct.  Hemostasis was excellent.  A closed suction drain was brought through the previous left lateral most port site into the  peritoneal cavity.  The specimen bag string was brought via the left sided paramedian robotic port site and the left specimen bag completely on the left side of the abdomen and the right ureter, left ureter, terminal ileum.  Tagged sutures were placed  into a laparoscopic grasper via 15 mm assistant port site.  Robot was then undocked.  Specimen was retrieved by extending the previous camera port site inferiorly, erring towards the left side of the umbilicus for a distance of approximately 6 cm,  removing en-bloc bladder, urethra, uterus specimen, setting aside for permanent pathology.  A wound protector type retractor was then placed via the site and the bilateral ureters and terminal ileal areas were brought through this for conduit formation  Attention was then directed at harvesting the bowel a segment.  A 14 cm segment of terminal ileum previously marked was taken to the continuity using green load stapler proximal and distal.  The mesentery was developed with a white load stapler x 1.5 loads distal, 1 load proximal  taking exquisite care to avoid devascularization of the anastomotic or conduit segments.  Conduit was then laid into  retroperitoneal orientation.  Attention was directed to the bowel-bowel anastomosis which was performed using a 1.5 loads of the renal  stapler on the antimesenteric border, which revealed excellent palpably patent bowel anastomosis.  The free end was oversewn using running silk with a second imbricating layer running silk.  The acute angle to the anastomosis was bolstered with  interrupted silk and the mesenteric defect was reapproximated using interrupted silk.  Again, the bowel-bowel anastomosis was palpably patent, visibly viable with excellent vascularity redelivered into the abdominal cavity.   proximal staple line of the  conduit was excluded using running Vicryl.  Distal staple line was removed.  Attention was directed to ureteroenteric anastomosis.  A 4 mm proximal conduit serosa mucosa was taken in continuity towards the mesenteric border.  Four mucosal sutures were  applied in a quadrant fashion using interrupted Vicryl and left ureter was trimmed to length, spatulated, and permanent section final set aside. Heel stitch was applied of 4-0 Vicryl and a blue color Bander stent was placed at 26 cm to the anastomosis  and further ureteroenteric anastomosis was performed using 2 separate running suture lines of 4-0 Vicryl, which revealed an excellent tension-free apposition of the left ureter.  A mirror image ureteroenteric anastomosis was performed on the  contralateral side of the proximal conduit with right ureter and placing a red color Bander stent at 26 cm to the anastomosis. A single chromic suture was applied through and through the midpoint of the conduit ____ avoid inadvertent displacement. Again,  I was quite happy with the conduit segment in terms of geometry anastomosis and vascularity.  Next, a quarter-sized diameter column of the skin and fibrofatty tissue was excised with the previously marked conduit site below the fascia, which was dilated  to accommodate 2 surgeon's fingers and there were distal conduit segment and stents were carefully brought through this after four fascial anchoring sutures were applied in quadrant fashion with 2-0 Vicryl.  These were then anchored to the level of the  proximal conduit to prevent parastomal hernia formation and 4 rosebudding sutures were applied in a similar fashion.  The abdomen was once again inspected via the extraction site.  Hemostasis was excellent.  Sponge and needle counts were correct. Omentum  was brought over the extraction site, it was then reapproximated at the level of fascia using figure-of-eight PDS x7 followed by  reapproximation of Scarpa's with running Vicryl.  All incision sites were infiltrated with dilute lipolyzed Marcaine and  closed at the level of the skin using subcuticular Monocryl and Dermabond.  A final conduit maturation was performed by tying down the previous rosebud sutures in opposing fashion and reapproximating the skin to the ____ mucosa in a quadrant fashion x3  each quadrant.  Stoma appliance was placed.  Next, the area of the urethra and vaginal cuff was inspected externally.  Again, there was excellent gross apposition with no defects allowing visualization of the peritoneal structures.  There was still some  mild mucosal separation of the internal labial structures, mucosa and these were reapproximated using interrupted Vicryl which resulted in excellent mucosal apposition circumferentially via the vaginal approach. We achieved the goal of the extirpative  portion of the procedure today.  Sponge and needle counts were correct.  The patient was then taken to postanesthesia care unit in stable condition.  Plan for Progressive care admission.  Please note, first assistant, Debbrah Alar was crucial for all portions of the surgery today.  She provided invaluable retraction, suctioning, vascular clipping,  vascular stapling, lymphatic clipping and specimen manipulation and general first  assistance.   NIK D: 03/04/2021 2:16:39 pm T: 03/05/2021 5:20:00 am  JOB: [id number]/ [id number]

## 2021-03-06 ENCOUNTER — Other Ambulatory Visit: Payer: Self-pay

## 2021-03-06 LAB — BASIC METABOLIC PANEL
Anion gap: 7 (ref 5–15)
BUN: 9 mg/dL (ref 8–23)
CO2: 24 mmol/L (ref 22–32)
Calcium: 8.2 mg/dL — ABNORMAL LOW (ref 8.9–10.3)
Chloride: 111 mmol/L (ref 98–111)
Creatinine, Ser: 0.71 mg/dL (ref 0.44–1.00)
GFR, Estimated: >60 mL/min (ref >60)
Glucose, Bld: 110 mg/dL — ABNORMAL HIGH (ref 70–99)
Potassium: 3.4 mmol/L — ABNORMAL LOW (ref 3.5–5.1)
Sodium: 142 mmol/L (ref 135–145)

## 2021-03-06 LAB — HEMOGLOBIN AND HEMATOCRIT, BLOOD
HCT: 18.9 % — ABNORMAL LOW (ref 36.0–46.0)
HCT: 23.7 % — ABNORMAL LOW (ref 36.0–46.0)
Hemoglobin: 6.4 g/dL — CL (ref 12.0–15.0)
Hemoglobin: 8.2 g/dL — ABNORMAL LOW (ref 12.0–15.0)

## 2021-03-06 LAB — PREPARE RBC (CROSSMATCH)

## 2021-03-06 MED ORDER — SODIUM CHLORIDE 0.9% IV SOLUTION: Freq: Once | INTRAVENOUS | Status: AC

## 2021-03-06 MED ORDER — SODIUM CHLORIDE 0.9% IV SOLUTION: Freq: Once | INTRAVENOUS | Status: DC

## 2021-03-06 NOTE — TOC Progression Note (Addendum)
Transition of Care [facility]) - Progression Note    Patient Details  Name: DEBBORAH ALONGE MRN: [id number] Date of Birth: [birth year]  Transition of Care [facility]) CM/SW Contact  Duane Trias, Juliann Pulse, RN Phone Number: 03/06/2021, 10:18 AM  Clinical Narrative:Contacted several [place] agencies for [place] teaching-no agency to provide nursing-[employer] rep Tommi Rumps can only provide HHPT-patient in agreement to HHPT only-she prefer to learn while in hospital-WOC/nsg updated. Contacting attending-await response. 10:30a-spoke to Dr. Micheline Rough in agreement to no nsg needed since patient has good family support, & otpt f/u resources available-order just for HHPT.       Expected Discharge Plan: [place] Barriers to Discharge: Continued Medical Work up  Expected Discharge Plan and Services Expected Discharge Plan: [place]   Discharge Planning Services: CM Consult Post Acute Care Choice: [place] arrangements for the past 2 months: Single Family Home                 DME Arranged: [place] DME Agency: [employer] Date DME Agency Contacted: 03/05/21 Time DME Agency Contacted: [phone] Representative spoke with at DME Agency: [place]: PT [place]: [place] Date [place]: 03/05/21 Time [place]: 1355 Representative spoke with at [place]: [place] ([place]) Interventions    Readmission Risk Interventions No flowsheet data found.

## 2021-03-06 NOTE — Progress Notes (Signed)
2 Days Post-Op   Subjective/Chief Complaint:    1 - Muscle Invasive Bladder-Urethral Cancer - s/p robotic cystectomy, TAH/BSO, node dissection, conduit urinary diversion on 03/04/2021. Path pending. Admitted 5/3 for bowel prep and stomal marking.  2 - Post-OP Ileus - bowel anastomosis as part of surgery 03/04/21. Entereg peri-op. NPO with ice chips POD 1.   3 - Disposition / Rehab - independent in all ADL's at baseline. Ostomy RN's working with in house, PT eval pending.  4- Post-Op Anemia - minimal intra-op blood loss, but Hgb drift to nadir 6.4 5/6. No tachycardia. 1upRBC ordered.   Today "Pam" is stable. Some progression of post-op anemia. NO tachycardia. No fevers / emesis.    Objective: Vital signs in last 24 hours: Temp:  [97.3 F (36.3 C)-98.2 F (36.8 C)] 98.2 F (36.8 C) (05/06 0547) Pulse Rate:  [66-74] 66 (05/06 0547) Resp:  [16-22] 21 (05/06 0547) BP: (105-112)/(40-52) 110/48 (05/06 0547) SpO2:  [96 %-99 %] 97 % (05/06 0547) Last BM Date: 03/03/21  Intake/Output from previous day: 05/05 0701 - 05/06 0700 In: 2772.5 [I.V.:2372.5; IV Piggyback:400] Out: 3120 [Urine:2600; Drains:520] Intake/Output this shift: No intake/output data recorded.   EXAM: NAD, AOx3 Non-labored breathing on minimal [place] O2 Regular heart rate Recent surgical sites c/d/i on abdomin. RLQ Urostomy pink/patent with Rt (red) and Lt (blue) bander stents and non-foul urine with scant mucus. JP with non-foul serosanguinous fluid Vaginal pad with non-foul serosanguinous fluid. No c/c/e.   Lab Results:  Recent Labs    03/03/21 1713 03/04/21 1458 03/05/21 0452 03/06/21 0354  WBC 8.8  --   --   --   HGB 11.7*   < > 7.2* 6.4*  HCT 33.9*   < > 21.4* 18.9*  PLT 198  --   --   --    < > = values in this interval not displayed.   BMET Recent Labs    03/05/21 0452 03/06/21 0354  NA 137 142  K 3.8 3.4*  CL 106 111  CO2 24 24  GLUCOSE 169* 110*  BUN 16 9  CREATININE 0.92 0.71  CALCIUM  7.9* 8.2*   PT/INR No results for input(s): LABPROT, INR in the last 72 hours. ABG No results for input(s): PHART, HCO3 in the last 72 hours.  Invalid input(s): PCO2, PO2  Studies/Results: No results found.  Anti-infectives: Anti-infectives (From admission, onward)   Start     Dose/Rate Route Frequency Ordered Stop   03/04/21 1800  piperacillin-tazobactam (ZOSYN) IVPB 3.375 g        3.375 g 12.5 mL/hr over 240 Minutes Intravenous Every 8 hours 03/04/21 1749 03/05/21 1324   03/04/21 0600  piperacillin-tazobactam (ZOSYN) IVPB 3.375 g        3.375 g 100 mL/hr over 30 Minutes Intravenous 30 min pre-op 03/03/21 1643 03/04/21 0856   03/03/21 1800  neomycin (MYCIFRADIN) tablet 1,000 mg        1,000 mg Oral Every 4 hours 03/03/21 1645 03/03/21 2238   03/03/21 1800  metroNIDAZOLE (FLAGYL) tablet 500 mg        500 mg Oral Every 4 hours 03/03/21 1645 03/03/21 2238      Assessment/Plan:  1 - Muscle Invasive Bladder-Urethral Cancer - path pending. No furhter cancer-directed care this admission. H/H again tomorrow, consider gentle transfusion if Hgb <7.  2 - Post-OP Ileus - adv to clears and continue untile BM or flatus. Continue sennakot.    3 - Disposition / Rehab - appreciate ostomy RN help.  PT eval today.   4 - Anemia - likely some peri-op loss and dilution. No high JP output or large curff output to suggest significanat ongoing loss. 1upRBC today and recheck Hgb tomorrow.   Remain in house.   Alexis Frock 03/06/2021

## 2021-03-06 NOTE — Progress Notes (Signed)
PT Cancellation Note  Patient Details Name: Sandra Mann MRN: [id number] DOB: [birth year]   Cancelled Treatment:      Pt just ambulated with nursing in hallway.  Will f/u as able.  [name], PT Acute Rehab Services Pager [phone] [facility] Rehab [phone]    [name] 03/06/2021, 4:48 PM

## 2021-03-06 NOTE — Care Management Important Message (Signed)
Important Message  Patient Details IM Letter given to the Patient. Name: Sandra Mann MRN: [id number] Date of Birth: 1951-08-04   Medicare Important Message Given:  Yes     Kerin Salen 03/06/2021, 12:31 PM

## 2021-03-06 NOTE — Plan of Care (Signed)

## 2021-03-07 LAB — BASIC METABOLIC PANEL
Anion gap: 7 (ref 5–15)
BUN: 5 mg/dL — ABNORMAL LOW (ref 8–23)
CO2: 24 mmol/L (ref 22–32)
Calcium: 8.6 mg/dL — ABNORMAL LOW (ref 8.9–10.3)
Chloride: 112 mmol/L — ABNORMAL HIGH (ref 98–111)
Creatinine, Ser: 0.69 mg/dL (ref 0.44–1.00)
GFR, Estimated: >60 mL/min (ref >60)
Glucose, Bld: 114 mg/dL — ABNORMAL HIGH (ref 70–99)
Potassium: 3.4 mmol/L — ABNORMAL LOW (ref 3.5–5.1)
Sodium: 143 mmol/L (ref 135–145)

## 2021-03-07 LAB — HEMOGLOBIN AND HEMATOCRIT, BLOOD
HCT: 23.3 % — ABNORMAL LOW (ref 36.0–46.0)
Hemoglobin: 8 g/dL — ABNORMAL LOW (ref 12.0–15.0)

## 2021-03-07 LAB — SURGICAL PATHOLOGY

## 2021-03-07 MED ORDER — SIMETHICONE 80 MG PO CHEW
80.0000 mg | CHEWABLE_TABLET | Freq: Three times a day (TID) | ORAL | Status: DC | PRN
  Administered 2021-03-07 (×2): 80 mg via ORAL
  Filled 2021-03-07 (×2): qty 1

## 2021-03-07 MED ORDER — POTASSIUM CHLORIDE 10 MEQ/100ML IV SOLN
10.0000 meq | INTRAVENOUS | Status: AC
  Administered 2021-03-07 (×6): 10 meq via INTRAVENOUS
  Filled 2021-03-07 (×6): qty 100

## 2021-03-07 NOTE — Progress Notes (Signed)
3 Days Post-Op Subjective: Pain well controlled.  No nausea or emesis.  No flatus or bowel movement.  T-max 101.3 last night and has down trended to 98.3 this morning.  Ambulating.  Objective: Vital signs in last 24 hours: Temp:  [98.3 F (36.8 C)-101.3 F (38.5 C)] 98.3 F (36.8 C) (05/07 0550) Pulse Rate:  [72-86] 72 (05/07 0550) Resp:  [18-20] 19 (05/07 0550) BP: (122-135)/(45-58) 124/54 (05/07 0550) SpO2:  [95 %-100 %] 97 % (05/07 0550)  Intake/Output from previous day: 05/06 0701 - 05/07 0700 In: 2674.5 [P.O.:360; I.V.:1986.5; Blood:328] Out: 3070 [Urine:2750; Drains:320] Intake/Output this shift: Total I/O In: -  Out: 100 [Drains:100]   UOP: 2.7L clear via IC JP: 325m ss  Physical Exam:  General: Alert and oriented CV: RRR Lungs: Clear Abdomen: Soft, ND, ATTP; inc c/d/I; IC p/p with stents in place; JP ss Ext: NT, No erythema  Lab Results: Recent Labs    03/06/21 0354 03/06/21 2014 03/07/21 0339  HGB 6.4* 8.2* 8.0*  HCT 18.9* 23.7* 23.3*   BMET Recent Labs    03/06/21 0354 03/07/21 0339  NA 142 143  K 3.4* 3.4*  CL 111 112*  CO2 24 24  GLUCOSE 110* 114*  BUN 9 5*  CREATININE 0.71 0.69  CALCIUM 8.2* 8.6*     Studies/Results: No results found.  Assessment/Plan: 1. Muscle invasive bladder and urethral cancer: S/p robotic cystectomy, TAH/BSO, b/l PLND, IC 03/04/2021 2. Postoperative ileus: Still without flatus or bowel movement on 03/07/2021 3. Postop anemia: #1 stable at 8.0 from 8.2 on 03/07/2021  -Pain control as needed -Advance to full's today.  We will plan to advance to regular diet tomorrow as long as she continues to have no emesis -Reviewed labs, appropriate.  Hemoglobin today is 8.0.  This remained stable from 8.2 yesterday.  This is likely dilutional given dry case. -Noted Tmax of 101.3 last night.  She is afebrile this morning.  If she spikes another fever, will obtain work-up with urine culture, blood culture, chest x-ray. -Continue  entereg. Bowel management with senokot. -JP to bulb suction -Replete K -OOB, amb, IS   LOS: 4 days   Matt R. Matti Killingsworth MD 03/07/2021, 9:00 AM [facility]  Pager: [phone]

## 2021-03-07 NOTE — Plan of Care (Signed)

## 2021-03-07 NOTE — Plan of Care (Signed)

## 2021-03-07 NOTE — Progress Notes (Signed)
Around 2104 patient had a fever of 101.3. Nurse was not made aware of this. Nurse did give pt some Dilaudid for 8/10 pain around 2205. Nurse rechecked pt's temperature when nurse saw in flowsheet that pt had a fever, and the temperature was 98.8. Pt told nurse that she did not feel feverish.

## 2021-03-08 LAB — BASIC METABOLIC PANEL
Anion gap: 6 (ref 5–15)
BUN: <5 mg/dL — ABNORMAL LOW (ref 8–23)
CO2: 25 mmol/L (ref 22–32)
Calcium: 8.5 mg/dL — ABNORMAL LOW (ref 8.9–10.3)
Chloride: 109 mmol/L (ref 98–111)
Creatinine, Ser: 0.79 mg/dL (ref 0.44–1.00)
GFR, Estimated: >60 mL/min (ref >60)
Glucose, Bld: 109 mg/dL — ABNORMAL HIGH (ref 70–99)
Potassium: 3.6 mmol/L (ref 3.5–5.1)
Sodium: 140 mmol/L (ref 135–145)

## 2021-03-08 LAB — HEMOGLOBIN AND HEMATOCRIT, BLOOD
HCT: 24.5 % — ABNORMAL LOW (ref 36.0–46.0)
Hemoglobin: 8.3 g/dL — ABNORMAL LOW (ref 12.0–15.0)

## 2021-03-08 MED ORDER — POTASSIUM CHLORIDE 10 MEQ/100ML IV SOLN
10.0000 meq | INTRAVENOUS | Status: AC
  Administered 2021-03-08 (×4): 10 meq via INTRAVENOUS
  Filled 2021-03-08 (×4): qty 100

## 2021-03-08 NOTE — Progress Notes (Signed)
4 Days Post-Op Subjective: Pain controlled. No nausea or emesis. Passing small amount of flatus, states had a small BM. Afebrile.   Objective: Vital signs in last 24 hours: Temp:  [98.1 F (36.7 C)-98.4 F (36.9 C)] 98.1 F (36.7 C) (05/08 0603) Pulse Rate:  [78-81] 80 (05/08 0603) Resp:  [18-20] 20 (05/08 0603) BP: (112-131)/(50-69) 131/69 (05/08 0603) SpO2:  [96 %-100 %] 96 % (05/08 0603)  Intake/Output from previous day: 05/07 0701 - 05/08 0700 In: 2372.3 [P.O.:480; I.V.:1292.3; IV Piggyback:600] Out: 7215872 [Urine:2051700; Drains:340] Intake/Output this shift: No intake/output data recorded.  UOP: error recording in chart, around 3L total. She had 1.6L first shift and 2L second shift. JP: 359m ss  Physical Exam:  General: Alert and oriented CV: RRR Lungs: Clear Abdomen: Soft, ND, ATTP; inc c/d/I; IC p/p with stents in place; JP ss Ext: NT, No erythema  Lab Results: Recent Labs    03/06/21 2014 03/07/21 0339 03/08/21 0319  HGB 8.2* 8.0* 8.3*  HCT 23.7* 23.3* 24.5*   BMET Recent Labs    03/07/21 0339 03/08/21 0319  NA 143 140  K 3.4* 3.6  CL 112* 109  CO2 24 25  GLUCOSE 114* 109*  BUN 5* <5*  CREATININE 0.69 0.79  CALCIUM 8.6* 8.5*     Studies/Results: No results found.  Assessment/Plan: 1. Muscle invasive bladder and urethral cancer: S/p robotic cystectomy, TAH/BSO, b/l PLND, IC 03/04/2021 2. Postoperative ileus: Still without flatus or bowel movement on 03/07/2021 3. Postop anemia: #1 stable at 8.0 from 8.2 on 03/07/2021  -Pain control prn -Advance to regular diet -Hgb stable at 8.3 -Cr 0.79 -Replete K -Continue entereg and bowel regimen -JP to bulb suction -OOB, amb, IS   LOS: 5 days   Matt R. Deloise Marchant MD 03/08/2021, 9:26 AM [facility]  Pager: [phone]

## 2021-03-09 ENCOUNTER — Other Ambulatory Visit (HOSPITAL_COMMUNITY): Payer: Self-pay

## 2021-03-09 LAB — TYPE AND SCREEN
ABO/RH(D): O NEG
Antibody Screen: NEGATIVE
Unit division: 0
Unit division: 0

## 2021-03-09 LAB — BASIC METABOLIC PANEL
Anion gap: 3 — ABNORMAL LOW (ref 5–15)
BUN: <5 mg/dL — ABNORMAL LOW (ref 8–23)
CO2: 27 mmol/L (ref 22–32)
Calcium: 8.5 mg/dL — ABNORMAL LOW (ref 8.9–10.3)
Chloride: 107 mmol/L (ref 98–111)
Creatinine, Ser: 0.63 mg/dL (ref 0.44–1.00)
GFR, Estimated: >60 mL/min (ref >60)
Glucose, Bld: 115 mg/dL — ABNORMAL HIGH (ref 70–99)
Potassium: 3.7 mmol/L (ref 3.5–5.1)
Sodium: 137 mmol/L (ref 135–145)

## 2021-03-09 LAB — HEMOGLOBIN AND HEMATOCRIT, BLOOD
HCT: 24.3 % — ABNORMAL LOW (ref 36.0–46.0)
Hemoglobin: 8.3 g/dL — ABNORMAL LOW (ref 12.0–15.0)

## 2021-03-09 LAB — BPAM RBC
Blood Product Expiration Date: 202206052359
Blood Product Expiration Date: 202206052359
ISSUE DATE / TIME: 202205061231
Unit Type and Rh: 9500
Unit Type and Rh: 9500

## 2021-03-09 LAB — CREATININE, FLUID (PLEURAL, PERITONEAL, JP DRAINAGE): Creat, Fluid: <0.3 mg/dL

## 2021-03-09 MED ORDER — TRAMADOL HCL 50 MG PO TABS: 50.0000 mg | ORAL_TABLET | Freq: Four times a day (QID) | ORAL | 0 refills | Status: DC | PRN

## 2021-03-09 NOTE — Plan of Care (Signed)

## 2021-03-09 NOTE — Consult Note (Signed)
[place] Nurse ostomy follow up Stoma type/location: RLQ, IC Stomal assessment/size: Stoma is red and viable; 1 1/4 inches, above skin level, 2 stints in place.  Peristomal assessment: intact with small dips in the abdominal topography at 2 o'clock and 9 o'clock   Output: dark urine Ostomy pouching: 1pc convex with 2" skin barrier Education provided:  Demonstrated pouch change (cutting new skin barrier, allowed patient to cut new skin barrier, measuring stoma, cleaning peristomal skin and stoma, use of barrier ring) Education on emptying when 1/3 to 1/2 full  Demonstrated use of wick in os when stents have been removed   Patient able to reconnect to BSD and open and close urinary pouch. Provided patient with extra BSD, adapter, 6 pouches and barrier rings in the room. Added leg strap per patients request for DC to home Demonstrated use of ostomy belt and discussed [employer] apparel and wraps   Enrolled patient in [employer] Discharge program: Yes  [place] Nurse will follow along with you for continued support with ostomy teaching and care [place] MSN, [place], [place], [place], [place]

## 2021-03-09 NOTE — Plan of Care (Signed)

## 2021-03-09 NOTE — Progress Notes (Signed)
Physical Therapy Treatment Patient Details Name: Sandra Mann MRN: [id number] DOB: 11-08-1950 Today's Date: 03/09/2021    History of Present Illness Pt is 70 yo female admitted on 03/03/21 with muscle invasive bladder-urethral CA, s/p cystoscopy, robotic laparoscopic radical cystectomy, bil pelvic lymphadenectomy, hysterectomy, bil salping-oophorectomy, and ileal conduit urinary diversion on 03/04/21 with post op ileus.  She has medical hx of GERD, RA, ureteral CA    PT Comments    Progressing with mobility. Pt reports she has been walking with husband-encouraged her to continue this. Pt reported some pain during ambulation on today.    Follow Up Recommendations  Home health PT (it pt/family agreeable);Supervision - Intermittent     Equipment Recommendations  Rolling walker with 5" wheels    Recommendations for Other Services       Precautions / Restrictions Precautions Precautions: Fall Precaution Comments: JP drain and ostomy Restrictions Weight Bearing Restrictions: No    Mobility  Bed Mobility Overal bed mobility: Needs Assistance Bed Mobility: Supine to Sit     Supine to sit: Supervision     General bed mobility comments: for safety, lines    Transfers Overall transfer level: Needs assistance Equipment used: 1 person hand held assist Transfers: Sit to/from Stand Sit to Stand: Min assist         General transfer comment: Assist to rise, steady, control descent. Cues for safety.  Ambulation/Gait Ambulation/Gait assistance: Min assist Gait Distance (Feet): 300 Feet Assistive device: 4-wheeled walker Gait Pattern/deviations: Step-through pattern;Decreased stride length     General Gait Details: Assist to steady intermittently. 2 instances of sharp abdominal pain that causes pt to stop in her tracks. 1 standing rest break taken/needed. She was able to continue on back to the room. Dyspnea 2/4. O2 97% on RA   Stairs             Wheelchair Mobility     Modified Rankin (Stroke Patients Only)       Balance Overall balance assessment: Needs assistance         Standing balance support: Single extremity supported Standing balance-Leahy Scale: Fair                              Cognition Arousal/Alertness: Awake/alert Behavior During Therapy: WFL for tasks assessed/performed Overall Cognitive Status: Within Functional Limits for tasks assessed                                        Exercises      General Comments        Pertinent Vitals/Pain Pain Assessment: Faces Faces Pain Scale: Hurts even more Pain Location: abdomen Pain Descriptors / Indicators: Discomfort;Spasm Pain Intervention(s): Monitored during session    Home Living                      Prior Function            PT Goals (current goals can now be found in the care plan section) Progress towards PT goals: Progressing toward goals    Frequency    Min 3X/week      PT Plan Current plan remains appropriate    Co-evaluation              AM-PAC PT "6 Clicks" Mobility   Outcome Measure  Help needed turning from your back to  your side while in a flat bed without using bedrails?: None Help needed moving from lying on your back to sitting on the side of a flat bed without using bedrails?: None Help needed moving to and from a bed to a chair (including a wheelchair)?: A Little Help needed standing up from a chair using your arms (e.g., wheelchair or bedside chair)?: A Little Help needed to walk in hospital room?: A Little Help needed climbing 3-5 steps with a railing? : A Little 6 Click Score: 20    End of Session   Activity Tolerance: Patient tolerated treatment well Patient left: in chair;with call bell/phone within reach;with family/visitor present   PT Visit Diagnosis: Difficulty in walking, not elsewhere classified (R26.2);Pain Pain - part of body:  (abdomen)     Time: 5300-5110 PT Time  Calculation (min) (ACUTE ONLY): 18 min  Charges:  $Gait Training: 8-22 mins                         Doreatha Massed, PT Acute Rehabilitation  Office: [phone] Pager: [phone]

## 2021-03-09 NOTE — Progress Notes (Signed)
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  Admitted 5/3 for bowel prep and stomal marking.  2 - Post-OP Ileus - bowel anastomosis as part of surgery 03/04/21. Entereg peri-op. NPO with ice chips POD 1. Adfanced to clears POD2 with som return of flatus. Fulld POD 3 and regular thereafter with resumed daily bowel function.   3 - Disposition / Rehab - independent in all ADL's at baseline. Ostomy RN's working with in house, PT eval feels no needs.   4- Post-Op Anemia - minimal intra-op blood loss, but Hgb drift to nadir 6.4 5/6. No tachycardia. 1upRBC ordered. Hgb 8s stable thereafter.   Today "Sandra Mann" is stable. Continues to ambulate and tolerate regular diet. Having additional ostomy teaching later today.    Objective: Vital signs in last 24 hours: Temp:  [97.7 F (36.5 C)-98.3 F (36.8 C)] 97.7 F (36.5 C) (05/09 0511) Pulse Rate:  [84-88] 88 (05/09 0511) Resp:  [18-20] 20 (05/09 0511) BP: (103-107)/(54-61) 107/54 (05/09 0511) SpO2:  [93 %-100 %] 100 % (05/09 0511) Last BM Date: 03/09/21  Intake/Output from previous day: 05/08 0701 - 05/09 0700 In: 120 [P.O.:120] Out: 4385 [Urine:4150; Drains:235] Intake/Output this shift: No intake/output data recorded.  EXAM: NAD, AOx3. Family in room.  Non-labored breathing on minimal  O2 Regular heart rate Recent surgical sites c/d/i on abdomin. RLQ Urostomy pink/patent with Rt (red) and Lt (blue) bander stents and non-foul urine with scant mucus. JP with non-foul serosanguinous fluid Vaginal pad with non-foul serosanguinous fluid. No c/c/e.   Lab Results:  Recent Labs    03/08/21 0319 03/09/21 0315  HGB 8.3* 8.3*  HCT 24.5* 24.3*   BMET Recent Labs    03/08/21 0319 03/09/21 0315  NA 140 137  K 3.6 3.7  CL 109 107  CO2 25 27   GLUCOSE 109* 115*  BUN <5* <5*  CREATININE 0.79 0.63  CALCIUM 8.5* 8.5*   PT/INR No results for input(s): LABPROT, INR in the last 72 hours. ABG No results for input(s): PHART, HCO3 in the last 72 hours.  Invalid input(s): PCO2, PO2  Studies/Results: No results found.  Anti-infectives: Anti-infectives (From admission, onward)   Start     Dose/Rate Route Frequency Ordered Stop   03/04/21 1800  piperacillin-tazobactam (ZOSYN) IVPB 3.375 g        3.375 g 12.5 mL/hr over 240 Minutes Intravenous Every 8 hours 03/04/21 1749 03/05/21 1324   03/04/21 0600  piperacillin-tazobactam (ZOSYN) IVPB 3.375 g        3.375 g 100 mL/hr over 30 Minutes Intravenous 30 min pre-op 03/03/21 1643 03/04/21 0856   03/03/21 1800  neomycin (MYCIFRADIN) tablet 1,000 mg        1,000 mg Oral Every 4 hours 03/03/21 1645 03/03/21 2238   03/03/21 1800  metroNIDAZOLE (FLAGYL) tablet 500 mg        500 mg Oral Every 4 hours 03/03/21 1645 03/03/21 2238      Assessment/Plan: Doing well POD 5. Getting close to discharge. HHRN reqeust placed, SLIV, ambulate, Check JP Cr. [place] DC tomorrow PM as long as continuing to progress.   Alexis Frock 03/09/2021

## 2021-03-09 NOTE — Care Management Important Message (Signed)
Medicare IM printed remotely for Social Work team to give to the patient.

## 2021-03-09 NOTE — TOC Progression Note (Signed)
Transition of Care [facility]) - Progression Note    Patient Details  Name: Sandra Mann MRN: [id number] Date of Birth: February 12, 1951  [facility]) CM/SW Contact  Videl Nobrega, Juliann Pulse, RN Phone Number: 03/09/2021, 12:38 PM  Clinical Narrative:  Nsg aware to continue instruction on ostomy care while in [facility] is only for HHPT-MD/patient agree.     Expected Discharge Plan: [place] Barriers to Discharge: Continued Medical Work up  Expected Discharge Plan and Services Expected Discharge Plan: [place]   Discharge Planning Services: CM Consult Post Acute Care Choice: [place] arrangements for the past 2 months: Single Family Home                 DME Arranged: Walker rolling DME Agency: [employer] Date DME Agency Contacted: 03/05/21 Time DME Agency Contacted: [phone] Representative spoke with at DME Agency: [place]: PT [place]: [place] Date [place]: 03/05/21 Time [place]: 1355 Representative spoke with at [place]: [place] ([place]) Interventions    Readmission Risk Interventions No flowsheet data found.

## 2021-03-10 ENCOUNTER — Other Ambulatory Visit (HOSPITAL_COMMUNITY): Payer: Self-pay

## 2021-03-10 MED ORDER — HEPARIN SOD (PORK) LOCK FLUSH 100 UNIT/ML IV SOLN
500.0000 [IU] | INTRAVENOUS | Status: AC | PRN
  Administered 2021-03-10: 500 [IU]

## 2021-03-10 NOTE — Consult Note (Signed)
[place] Nurse ostomy follow up Stoma type/location: RUQ, ileal conduit Stomal assessment/size:  1 1/4" budded, pink, moist Peristomal assessment: intact  Treatment options for stomal/peristomal skin: using 2" skin barrier Output dark, yellow urine  Ostomy pouching: 1pc. convex Education provided:  Met with patient, she and I looked at bathing suits that she can wear with IC We discussed traveling with a stoma Provided her with materials from [employer] for wraps that can be used for coverage of her pouch.  Patient report independence with hooking and unhooking from BSD and emptying pouch. Discussed supplies  Enrolled patient in [place] Start Discharge program: Yes I will check with her husband around 1pm today to verify he has no further questions.    [place] Nurse will follow along with you for continued support with ostomy teaching and care [place] MSN, RN, [place], [place], [place]

## 2021-03-10 NOTE — Discharge Summary (Signed)
Physician Discharge Summary  Patient ID: CODA FILLER MRN: [id number] DOB/AGE: 05/22/1951 70 y.o.  Admit date: 03/03/2021 Discharge date: 03/10/2021  Admission Diagnoses: Urethral Cancer  Discharge Diagnoses: Metastatic Urethral Cancer Active Problems:   Urethral carcinoma [facility])   Discharged Condition: good  Hospital Course:   1 - Metastatic Invasive Bladder-Urethral Cancer - s/p robotic cystectomy, TAH/BSO, node dissection, conduit urinary diversion on 03/04/2021 for pT4N1M0 urothelial carcinoma with negative margins.  Admitted 5/3 for bowel prep and stomal marking. JP removed 03/10/21 as output scant and Cr same as serum.   2 - Post-OP Ileus - bowel anastomosis as part of surgery 03/04/21. Entereg peri-op. NPO with ice chips POD 1. Adfanced to clears POD2 with som return of flatus. Fulld POD 3 and regular thereafter with resumed daily bowel function.   3 - Disposition / Rehab - independent in all ADL's at baseline. Ostomy RN's working with in house, PT eval feels no needs.   4- Post-Op Anemia - minimal intra-op blood loss, but Hgb drift to nadir 6.4 5/6. No tachycardia. 1upRBC ordered. Hgb 8s stable thereafter.   By the afternoon of POD6, the day of discharge, she is ambulatory, tollerating regular diet with resumed bowel function, pain controlled on PO meds, facile with urostomy management, and felt to be adequate for discharge.    Consults: None  Significant Diagnostic Studies: labs: as per above  Treatments: surgery: as per above  Discharge Exam: Blood pressure (!) 111/54, pulse 83, temperature 98.6 F (37 C), temperature source Oral, resp. rate 13, SpO2 97 %.   EXAM: NAD, AOx3. Husband in room.  Non-labored breathing on  Room air.  Regular heart rate Recent surgical sites c/d/i on abdomin. RLQ Urostomy pink/patent with Rt (red) and Lt (blue) bander stents and non-foul urine with scant mucus. JP with non-foul serosanguinous fluid, removed and dry dressing  applied. Vaginal pad with non-foul serosanguinous fluid. No c/c/e.   Disposition: HOME   Allergies as of 03/10/2021      Reactions   Codeine Nausea And Vomiting   Sulfa Antibiotics Nausea And Vomiting   Vicodin [hydrocodone-acetaminophen] Nausea And Vomiting      Medication List    STOP taking these medications   methotrexate 2.5 MG tablet Commonly known as: RHEUMATREX     TAKE these medications   cycloSPORINE 0.05 % ophthalmic emulsion Commonly known as: RESTASIS Place 1 drop into both eyes 2 (two) times daily.   folic acid 759 MCG tablet Commonly known as: FOLVITE Take 1,600 mcg by mouth daily.   lidocaine-prilocaine cream Commonly known as: EMLA Apply 1 application topically as needed. What changed: reasons to take this   mesalamine 1000 MG suppository Commonly known as: CANASA Place 1,000 mg rectally every other day. At bedtime   Milk of Magnesia 400 MG/5ML suspension Generic drug: magnesium hydroxide Take 5 mLs by mouth daily as needed for mild constipation.   multivitamin with minerals Tabs tablet Take 1 tablet by mouth at bedtime. Silver   omeprazole 40 MG capsule Commonly known as: PRILOSEC Take 40 mg by mouth in the morning.   prochlorperazine 10 MG tablet Commonly known as: COMPAZINE Take 1 tablet (10 mg total) by mouth every 6 (six) hours as needed for nausea or vomiting.   senna-docusate 8.6-50 MG tablet Commonly known as: Senna S Take 1 tablet by mouth 2 (two) times daily. What changed: when to take this   traMADol 50 MG tablet Commonly known as: Ultram Take 1-2 tablets (50-100 mg total) by mouth every  6 (six) hours as needed for moderate pain.            Durable Medical Equipment  (From admission, onward)         Start     Ordered   03/05/21 1346  For home use only DME Walker rolling  Once       Question Answer Comment  Walker: With 5 Inch Wheels   Patient needs a walker to treat with the following condition Unsteady gait       03/05/21 1346          Follow-up Information    Alexis Frock, MD On 03/26/2021.   Specialty: Urology Why: at 10:30 AM for MD visit Contact information: [place] [address] [phone]        Care, [facility] Follow up.   Specialty: [place] Why: [facility] nursing/physical therapy Contact information: [place] [address] [phone]               Signed: Alexis Frock 03/10/2021, 12:58 PM

## 2021-03-10 NOTE — Progress Notes (Signed)
Discharge instructions reviewed with patient and spouse. Pt is stable at this time. IV RN contacted to assist with port deaccess. IV removed. JP site removed by provider. Dressing remains clean dry intact. Ileal conduit site clean dry intact tubing remains patent and to gravity. Questions, concerns related to discharge instructions/plan denied at this time.

## 2021-03-10 NOTE — Progress Notes (Signed)
Physical Therapy Treatment Patient Details Name: Sandra Mann MRN: [id number] DOB: Jul 20, 1951 Today's Date: 03/10/2021    History of Present Illness Pt is 70 yo female admitted on 03/03/21 with muscle invasive bladder-urethral CA, s/p cystoscopy, robotic laparoscopic radical cystectomy, bil pelvic lymphadenectomy, hysterectomy, bil salping-oophorectomy, and ileal conduit urinary diversion on 03/04/21 with post op ileus.  She has medical hx of GERD, RA, ureteral CA    PT Comments    Pt moving well throughout her room to and from bathroom without assist.  Also tolerated a great distance amb in hallway. Pt hopes to D/C to home today.   Follow Up Recommendations  Home health PT;Supervision - Intermittent     Equipment Recommendations  Rolling walker with 5" wheels    Recommendations for Other Services       Precautions / Restrictions Precautions Precautions: None Precaution Comments: JP drain and ostomy    Mobility  Bed Mobility Overal bed mobility: Modified Independent             General bed mobility comments: pt self able    Transfers Overall transfer level: Needs assistance Equipment used: 1 person hand held assist;Rolling walker (2 wheeled) Transfers: Sit to/from [facility] Sit to Stand: Supervision Stand pivot transfers: Supervision       General transfer comment: good use of hands to steady self  Ambulation/Gait   Gait Distance (Feet): 350 Feet Assistive device: Rolling walker (2 wheeled) Gait Pattern/deviations: Step-through pattern;Decreased stride length Gait velocity: decreased   General Gait Details: tolerated an extended distance with light lean on walker.  "I'm a walker" stated pt.  Believe pt will not need to use walker in home.   Stairs             Wheelchair Mobility    Modified Rankin (Stroke Patients Only)       Balance                                            Cognition  Arousal/Alertness: Awake/alert Behavior During Therapy: WFL for tasks assessed/performed Overall Cognitive Status: Within Functional Limits for tasks assessed                                 General Comments: AxO x 3 very pleasant and motivated.  Retired from [employer] 40+ years.      Exercises      General Comments        Pertinent Vitals/Pain Pain Assessment: Faces Faces Pain Scale: Hurts a little bit Pain Location: abdomen Pain Descriptors / Indicators: Discomfort;Operative site guarding Pain Intervention(s): Monitored during session    Home Living                      Prior Function            PT Goals (current goals can now be found in the care plan section) Progress towards PT goals: Progressing toward goals    Frequency    Min 3X/week      PT Plan Current plan remains appropriate    Co-evaluation              AM-PAC PT "6 Clicks" Mobility   Outcome Measure  Help needed turning from your back to your side while in a flat bed without using bedrails?:  None Help needed moving from lying on your back to sitting on the side of a flat bed without using bedrails?: None Help needed moving to and from a bed to a chair (including a wheelchair)?: A Little Help needed standing up from a chair using your arms (e.g., wheelchair or bedside chair)?: A Little Help needed to walk in hospital room?: A Little Help needed climbing 3-5 steps with a railing? : A Little 6 Click Score: 20    End of Session Equipment Utilized During Treatment: Gait belt Activity Tolerance: Patient tolerated treatment well Patient left: in bed;with call bell/phone within reach Nurse Communication: Mobility status       Time: 1135-1150 PT Time Calculation (min) (ACUTE ONLY): 15 min  Charges:  $Gait Training: 8-22 mins                     Rica Koyanagi  PTA [place] Pager      [phone] Office      [phone]

## 2021-03-11 ENCOUNTER — Other Ambulatory Visit (HOSPITAL_COMMUNITY): Payer: Self-pay

## 2021-03-26 ENCOUNTER — Inpatient Hospital Stay: Payer: Medicare Other | Attending: Oncology

## 2021-03-26 ENCOUNTER — Inpatient Hospital Stay: Payer: Medicare Other | Admitting: Oncology

## 2021-03-26 ENCOUNTER — Other Ambulatory Visit: Payer: Self-pay

## 2021-03-26 ENCOUNTER — Other Ambulatory Visit: Payer: Medicare Other

## 2021-03-26 VITALS — BP 128/67 | HR 18 | Temp 96.5°F | Resp 18 | Ht 63.0 in | Wt 113.7 lb

## 2021-03-26 DIAGNOSIS — Z95828 Presence of other vascular implants and grafts: Secondary | ICD-10-CM

## 2021-03-26 DIAGNOSIS — C680 Malignant neoplasm of urethra: Secondary | ICD-10-CM | POA: Insufficient documentation

## 2021-03-26 DIAGNOSIS — Z452 Encounter for adjustment and management of vascular access device: Secondary | ICD-10-CM | POA: Insufficient documentation

## 2021-03-26 DIAGNOSIS — M545 Low back pain, unspecified: Secondary | ICD-10-CM | POA: Diagnosis not present

## 2021-03-26 DIAGNOSIS — R197 Diarrhea, unspecified: Secondary | ICD-10-CM | POA: Insufficient documentation

## 2021-03-26 LAB — CBC WITH DIFFERENTIAL (CANCER CENTER ONLY)
Abs Immature Granulocytes: 0.09 10*3/uL — ABNORMAL HIGH (ref 0.00–0.07)
Basophils Absolute: 0 10*3/uL (ref 0.0–0.1)
Basophils Relative: 0 %
Eosinophils Absolute: 0 10*3/uL (ref 0.0–0.5)
Eosinophils Relative: 0 %
HCT: 24.4 % — ABNORMAL LOW (ref 36.0–46.0)
Hemoglobin: 8 g/dL — ABNORMAL LOW (ref 12.0–15.0)
Immature Granulocytes: 1 %
Lymphocytes Relative: 9 %
Lymphs Abs: 1.2 10*3/uL (ref 0.7–4.0)
MCH: 29.1 pg (ref 26.0–34.0)
MCHC: 32.8 g/dL (ref 30.0–36.0)
MCV: 88.7 fL (ref 80.0–100.0)
Monocytes Absolute: 0.7 10*3/uL (ref 0.1–1.0)
Monocytes Relative: 6 %
Neutro Abs: 10.8 10*3/uL — ABNORMAL HIGH (ref 1.7–7.7)
Neutrophils Relative %: 84 %
Platelet Count: 467 10*3/uL — ABNORMAL HIGH (ref 150–400)
RBC: 2.75 MIL/uL — ABNORMAL LOW (ref 3.87–5.11)
RDW: 14 % (ref 11.5–15.5)
WBC Count: 12.9 10*3/uL — ABNORMAL HIGH (ref 4.0–10.5)
nRBC: 0 % (ref 0.0–0.2)

## 2021-03-26 LAB — CMP (CANCER CENTER ONLY)
ALT: 43 U/L (ref 0–44)
AST: 55 U/L — ABNORMAL HIGH (ref 15–41)
Albumin: 2.3 g/dL — ABNORMAL LOW (ref 3.5–5.0)
Alkaline Phosphatase: 124 U/L (ref 38–126)
Anion gap: 11 (ref 5–15)
BUN: 15 mg/dL (ref 8–23)
CO2: 23 mmol/L (ref 22–32)
Calcium: 8.7 mg/dL — ABNORMAL LOW (ref 8.9–10.3)
Chloride: 102 mmol/L (ref 98–111)
Creatinine: 0.68 mg/dL (ref 0.44–1.00)
GFR, Estimated: >60 mL/min (ref >60)
Glucose, Bld: 136 mg/dL — ABNORMAL HIGH (ref 70–99)
Potassium: 3.4 mmol/L — ABNORMAL LOW (ref 3.5–5.1)
Sodium: 136 mmol/L (ref 135–145)
Total Bilirubin: 0.3 mg/dL (ref 0.3–1.2)
Total Protein: 6.7 g/dL (ref 6.5–8.1)

## 2021-03-26 MED ORDER — HEPARIN SOD (PORK) LOCK FLUSH 100 UNIT/ML IV SOLN
500.0000 [IU] | Freq: Once | INTRAVENOUS | Status: AC
  Administered 2021-03-26: 500 [IU]
  Filled 2021-03-26: qty 5

## 2021-03-26 MED ORDER — SODIUM CHLORIDE 0.9% FLUSH
10.0000 mL | Freq: Once | INTRAVENOUS | Status: AC
  Administered 2021-03-26: 10 mL
  Filled 2021-03-26: qty 10

## 2021-03-26 NOTE — Progress Notes (Signed)
Hematology and Oncology Follow Up Visit  Sandra Mann [id number] 14-Aug-1951 70 y.o. 03/26/2021 12:29 PM Sandra Mann, MDEksir, Sandra Conroy, MD   Principle Diagnosis: 70 year old woman with clinical stage T4N0 urethral cancer diagnosed in November 2021.  She was found to have T4N1 disease after surgical resection in May 2022.   Prior Therapy: She is status post cystoscopy and biopsy in November 2021 which showed high-grade urothelial carcinoma. Neoadjuvant chemotherapy utilizing gemcitabine and cisplatin started on October 29, 2020.  She completed 4 cycles of therapy on January 07, 2021. She is status post robotic assisted laparoscopic cystectomy, hysterectomy and lymph node dissection completed on Mar 04, 2021 completed by Dr. Tammi Klippel.  He final pathology showed T4N1 disease.  She had 1 out of 16 lymph node involvement.  Current therapy: Under evaluation for additional treatment.   Interim History: [name] returns today for repeat evaluation.  Since the last visit, she underwent surgical resection on May 4 without any Mann complications.  She is recovering slowly at this time although improving slowly at this time.  She is increased her mobility but still has episodic diarrhea and lower back pain for which she takes Tylenol.  She denies any recent hospitalization or illnesses.  She denies any fevers chills or sweats..  Medications: Unchanged on review. Current Outpatient Medications  Medication Sig Dispense Refill  . cycloSPORINE (RESTASIS) 0.05 % ophthalmic emulsion Place 1 drop into both eyes 2 (two) times daily.    . folic acid (FOLVITE) 937 MCG tablet Take 1,600 mcg by mouth daily.     Marland Kitchen lidocaine-prilocaine (EMLA) cream Apply 1 application topically as needed. (Patient taking differently: Apply 1 application topically as needed (for the port).) 30 g 0  . magnesium hydroxide (MILK OF MAGNESIA) 400 MG/5ML suspension Take 5 mLs by mouth daily as needed for mild constipation. (Patient  not taking: Reported on 02/18/2021) 355 mL 0  . mesalamine (CANASA) 1000 MG suppository Place 1,000 mg rectally every other day. At bedtime    . Multiple Vitamin (MULTIVITAMIN WITH MINERALS) TABS tablet Take 1 tablet by mouth at bedtime. Silver    . omeprazole (PRILOSEC) 40 MG capsule Take 40 mg by mouth in the morning.     . prochlorperazine (COMPAZINE) 10 MG tablet Take 1 tablet (10 mg total) by mouth every 6 (six) hours as needed for nausea or vomiting. (Patient not taking: Reported on 02/18/2021) 30 tablet 0  . senna-docusate (SENNA S) 8.6-50 MG tablet Take 1 tablet by mouth 2 (two) times daily. (Patient taking differently: Take 1 tablet by mouth at bedtime.) 60 tablet 3  . traMADol (ULTRAM) 50 MG tablet Take 1-2 tablets (50-100 mg total) by mouth every 6 (six) hours as needed for moderate pain. 20 tablet 0   No current facility-administered medications for this visit.     Allergies:  Allergies  Allergen Reactions  . Codeine Nausea And Vomiting  . Sulfa Antibiotics Nausea And Vomiting  . Vicodin [Hydrocodone-Acetaminophen] Nausea And Vomiting      Physical Exam:   Blood pressure 128/67, pulse (!) 18, temperature (!) 96.5 F (35.8 C), temperature source Tympanic, resp. rate 18, height 5' 3"  (1.6 m), weight 113 lb 11.2 oz (51.6 kg), SpO2 99 %.     ECOG: 0    General appearance: Comfortable appearing without any discomfort Head: Normocephalic without any trauma Oropharynx: Mucous membranes are moist and pink without any thrush or ulcers. Eyes: Pupils are equal and round reactive to light. Lymph nodes: No cervical, supraclavicular,  inguinal or axillary lymphadenopathy.   Heart:regular rate and rhythm.  S1 and S2 without leg edema. Lung: Clear without any rhonchi or wheezes.  No dullness to percussion. Abdomin: Soft, nontender, nondistended with good bowel sounds.  No hepatosplenomegaly. Musculoskeletal: No joint deformity or effusion.  Full range of motion  noted. Neurological: No deficits noted on motor, sensory and deep tendon reflex exam. Skin: No petechial rash or dryness.  Appeared moist.    .       Lab Results: Lab Results  Component Value Date   WBC 8.8 03/03/2021   HGB 8.3 (L) 03/09/2021   HCT 24.3 (L) 03/09/2021   MCV 94.7 03/03/2021   PLT 198 03/03/2021     Chemistry      Component Value Date/Time   NA 137 03/09/2021 0315   K 3.7 03/09/2021 0315   CL 107 03/09/2021 0315   CO2 27 03/09/2021 0315   BUN <5 (L) 03/09/2021 0315   CREATININE 0.63 03/09/2021 0315   CREATININE 0.73 01/14/2021 1044      Component Value Date/Time   CALCIUM 8.5 (L) 03/09/2021 0315   ALKPHOS 70 03/03/2021 1713   AST 29 03/03/2021 1713   AST 25 01/14/2021 1044   ALT 15 03/03/2021 1713   ALT 13 01/14/2021 1044   BILITOT 0.8 03/03/2021 1713   BILITOT 0.5 01/14/2021 1044        Impression and Plan:  70 year old woman with:  1.    T4N1 high-grade urethral cancer diagnosed in November 2021.    She is status post surgical resection at this time with fairly substantial tumor found despite neoadjuvant chemotherapy.  Treatment options moving forward were discussed including active surveillance versus adjuvant immunotherapy were discussed.  The risks and benefits of immunotherapy in this particular setting and the rationale was discussed.  The other option is to defer this treatment unless he develops relapsed disease.  Immunotherapy in the form of nivolumab can be given on a monthly basis to complete a year adjuvantly given her high risk disease.  After discussion today, she would consider this in the future but she would like Time for her to recover before consideration at this time.  This is certainly reasonable and I will reevaluate her in 6 to 8 weeks.    2. IV access:Port-A-Cath will continue to be in use at this time.  3. Antiemetics: No residual nausea or vomiting reported at this time.  4. Renal function surveillance:  Creatinine clearance remains at normal despite platinum based therapy.  5. Goals of care:Aggressive measures are warranted and treatment remains curative at this time.    6. Follow-up: In 6 weeks for repeat follow-up.  30  minutes were spent on this encounter.  Time was dedicated to reviewing her pathology results, treatment options and complications related to her cancer and cancer therapy.  [name], MD 5/26/202212:29 PM

## 2021-05-13 ENCOUNTER — Inpatient Hospital Stay: Payer: Medicare Other | Attending: Oncology | Admitting: Oncology

## 2021-05-13 ENCOUNTER — Other Ambulatory Visit: Payer: Self-pay

## 2021-05-13 ENCOUNTER — Inpatient Hospital Stay: Payer: Medicare Other

## 2021-05-13 VITALS — BP 131/67 | HR 62 | Temp 98.0°F | Resp 16 | Ht 63.0 in | Wt 113.5 lb

## 2021-05-13 DIAGNOSIS — Z79899 Other long term (current) drug therapy: Secondary | ICD-10-CM | POA: Insufficient documentation

## 2021-05-13 DIAGNOSIS — Z5112 Encounter for antineoplastic immunotherapy: Secondary | ICD-10-CM | POA: Insufficient documentation

## 2021-05-13 DIAGNOSIS — Z95828 Presence of other vascular implants and grafts: Secondary | ICD-10-CM

## 2021-05-13 DIAGNOSIS — C680 Malignant neoplasm of urethra: Secondary | ICD-10-CM | POA: Insufficient documentation

## 2021-05-13 NOTE — Progress Notes (Signed)
Hematology and Oncology Follow Up Visit  Sandra Mann [id number] Nov 09, 1950 70 y.o. 05/13/2021 8:43 AM Daron Offer, [name], MDEksir, [name], MD   Principle Diagnosis: 70 year old woman with T4N1 urethral high-grade urothelial cancer diagnosed in November 2021.     Prior Therapy: She is status post cystoscopy and biopsy in November 2021 which showed high-grade urothelial carcinoma. Neoadjuvant chemotherapy utilizing gemcitabine and cisplatin started on October 29, 2020.  She completed 4 cycles of therapy on January 07, 2021. She is status post robotic assisted laparoscopic cystectomy, hysterectomy and lymph node dissection completed on Mar 04, 2021 completed by Dr. Tammi Klippel.  He final pathology showed T4N1 disease.  She had 1 out of 16 lymph node involvement.  Current therapy: Under consideration to start immunotherapy.   Interim History: Sandra Mann presents today for repeat follow-up.  Since her last visit, she continues to recover reasonably well from her operation without any signs or symptoms of relapsed disease.  She is eating better and gained more weight.  She does report some occasional discomfort around her urostomy but have regained most activities of daily living.  She denies any hospitalization or illnesses.  Medications: Reviewed without changes. Current Outpatient Medications  Medication Sig Dispense Refill   cycloSPORINE (RESTASIS) 0.05 % ophthalmic emulsion Place 1 drop into both eyes 2 (two) times daily.     folic acid (FOLVITE) 449 MCG tablet Take 1,600 mcg by mouth daily.      lidocaine-prilocaine (EMLA) cream Apply 1 application topically as needed. (Patient taking differently: Apply 1 application topically as needed (for the port).) 30 g 0   magnesium hydroxide (MILK OF MAGNESIA) 400 MG/5ML suspension Take 5 mLs by mouth daily as needed for mild constipation. 355 mL 0   mesalamine (CANASA) 1000 MG suppository Place 1,000 mg rectally every other day. At bedtime      Multiple Vitamin (MULTIVITAMIN WITH MINERALS) TABS tablet Take 1 tablet by mouth at bedtime. Silver     omeprazole (PRILOSEC) 40 MG capsule Take 40 mg by mouth in the morning.      prochlorperazine (COMPAZINE) 10 MG tablet Take 1 tablet (10 mg total) by mouth every 6 (six) hours as needed for nausea or vomiting. (Patient not taking: Reported on 02/18/2021) 30 tablet 0   senna-docusate (SENNA S) 8.6-50 MG tablet Take 1 tablet by mouth 2 (two) times daily. (Patient taking differently: Take 1 tablet by mouth at bedtime.) 60 tablet 3   traMADol (ULTRAM) 50 MG tablet Take 1-2 tablets (50-100 mg total) by mouth every 6 (six) hours as needed for moderate pain. 20 tablet 0   No current facility-administered medications for this visit.     Allergies:  Allergies  Allergen Reactions   Codeine Nausea And Vomiting   Sulfa Antibiotics Nausea And Vomiting   Vicodin [Hydrocodone-Acetaminophen] Nausea And Vomiting      Physical Exam:   Blood pressure 131/67, pulse 62, temperature 98 F (36.7 C), temperature source Oral, resp. rate 16, height 5' 3"  (1.6 m), weight 113 lb 8 oz (51.5 kg), SpO2 100 %.     ECOG: 0   General appearance: Alert, awake without any distress. Head: Atraumatic without abnormalities Oropharynx: Without any thrush or ulcers. Eyes: No scleral icterus. Lymph nodes: No lymphadenopathy noted in the cervical, supraclavicular, or axillary nodes Heart:regular rate and rhythm, without any murmurs or gallops.   Lung: Clear to auscultation without any rhonchi, wheezes or dullness to percussion. Abdomin: Soft, nontender without any shifting dullness or ascites. Musculoskeletal: No clubbing or  cyanosis. Neurological: No motor or sensory deficits. Dermatological: No rashes or lesions.         Lab Results: Lab Results  Component Value Date   WBC 12.9 (H) 03/26/2021   HGB 8.0 (L) 03/26/2021   HCT 24.4 (L) 03/26/2021   MCV 88.7 03/26/2021   PLT 467 (H) 03/26/2021      Chemistry      Component Value Date/Time   NA 136 03/26/2021 1302   K 3.4 (L) 03/26/2021 1302   CL 102 03/26/2021 1302   CO2 23 03/26/2021 1302   BUN 15 03/26/2021 1302   CREATININE 0.68 03/26/2021 1302      Component Value Date/Time   CALCIUM 8.7 (L) 03/26/2021 1302   ALKPHOS 124 03/26/2021 1302   AST 55 (H) 03/26/2021 1302   ALT 43 03/26/2021 1302   BILITOT 0.3 03/26/2021 1302        Impression and Plan:  70 year old woman with:   1.    Cancer of the urethra diagnosed in November 2021.  She was found to have T4N1 high-grade urethral subtype after surgical resection in May 2022.   Her disease status was updated at this time and treatment options were discussed.  Given her high risk malignancy with large tumor and positive lymph node and recommended proceeding with adjuvant immunotherapy in the form of Opdivo.  Risks and benefits of this treatment were discussed at this time.  Complications that include nausea fatigue and immune mediated complications were reviewed.  I anticipate that proceeding with monthly treatment for a total of a year and we will update her staging scans in September 2022.  Alternative options would be active surveillance which I do not advise given her high risk of disease relapse.   2.  IV access: Port-A-Cath remains in place without any issues.   3.  Antiemetics: Compazine is available to her without any recent nausea or vomiting.   4.  Immune mediated complications: We will continue to educate her about potential issues including pneumonitis, colitis and thyroid disease.   5.  Goals of care: Treatment remains curative at this time.   6.  Follow-up: In the near future to start [place].  She will follow-up the following months with a repeat evaluation follow-up.   30  minutes were dedicated to this visit.  The time was spent on reviewing her imaging studies, pathology results, treatment choices and outlining future plan of care.  [name],  MD 7/13/20228:43 AM

## 2021-05-13 NOTE — Progress Notes (Signed)
DISCONTINUE ON PATHWAY REGIMEN - Bladder     A cycle is every 21 days:     Gemcitabine      Cisplatin   **Always confirm dose/schedule in your pharmacy ordering system**  REASON: Other Reason PRIOR TREATMENT: [id number]: Gemcitabine 1,000 mg/m2 D1, 8 + Cisplatin 70 mg/m2 D1 q21 Days for a Maximum of 6 Cycles TREATMENT RESPONSE: Complete Response (CR)  START ON PATHWAY REGIMEN - Bladder     A cycle is every 14 days:     Nivolumab   **Always confirm dose/schedule in your pharmacy ordering system**  Patient Characteristics: Post-Cystectomy with Neoadjuvant Therapy (Neoadjuvant Pathologic Staging), ypT2-T4b or ypN+, M0 Therapeutic Status: Post-Cystectomy with Neoadjuvant Therapy (Neoadjuvant Pathologic Staging) AJCC N Category: ypN1 AJCC M Category: cM0 AJCC 8 Stage Grouping: IVA AJCC T Category: ypT4b Intent of Therapy: Curative Intent, Discussed with Patient

## 2021-05-22 ENCOUNTER — Inpatient Hospital Stay: Payer: Medicare Other

## 2021-05-22 ENCOUNTER — Other Ambulatory Visit: Payer: Self-pay

## 2021-05-22 ENCOUNTER — Telehealth: Payer: Self-pay | Admitting: *Deleted

## 2021-05-22 VITALS — BP 112/49 | HR 63 | Temp 98.0°F | Resp 16 | Wt 114.0 lb

## 2021-05-22 DIAGNOSIS — C680 Malignant neoplasm of urethra: Secondary | ICD-10-CM

## 2021-05-22 DIAGNOSIS — Z5112 Encounter for antineoplastic immunotherapy: Secondary | ICD-10-CM | POA: Diagnosis not present

## 2021-05-22 DIAGNOSIS — Z95828 Presence of other vascular implants and grafts: Secondary | ICD-10-CM

## 2021-05-22 LAB — CBC WITH DIFFERENTIAL (CANCER CENTER ONLY)
Abs Immature Granulocytes: 0.01 10*3/uL (ref 0.00–0.07)
Basophils Absolute: 0 10*3/uL (ref 0.0–0.1)
Basophils Relative: 1 %
Eosinophils Absolute: 0.1 10*3/uL (ref 0.0–0.5)
Eosinophils Relative: 2 %
HCT: 29.9 % — ABNORMAL LOW (ref 36.0–46.0)
Hemoglobin: 10.1 g/dL — ABNORMAL LOW (ref 12.0–15.0)
Immature Granulocytes: 0 %
Lymphocytes Relative: 35 %
Lymphs Abs: 1.8 10*3/uL (ref 0.7–4.0)
MCH: 30.8 pg (ref 26.0–34.0)
MCHC: 33.8 g/dL (ref 30.0–36.0)
MCV: 91.2 fL (ref 80.0–100.0)
Monocytes Absolute: 0.5 10*3/uL (ref 0.1–1.0)
Monocytes Relative: 9 %
Neutro Abs: 2.8 10*3/uL (ref 1.7–7.7)
Neutrophils Relative %: 53 %
Platelet Count: 208 10*3/uL (ref 150–400)
RBC: 3.28 MIL/uL — ABNORMAL LOW (ref 3.87–5.11)
RDW: 17.5 % — ABNORMAL HIGH (ref 11.5–15.5)
WBC Count: 5.3 10*3/uL (ref 4.0–10.5)
nRBC: 0 % (ref 0.0–0.2)

## 2021-05-22 LAB — TSH: TSH: 0.875 u[IU]/mL (ref 0.308–3.960)

## 2021-05-22 LAB — CMP (CANCER CENTER ONLY)
ALT: 15 U/L (ref 0–44)
AST: 27 U/L (ref 15–41)
Albumin: 3.6 g/dL (ref 3.5–5.0)
Alkaline Phosphatase: 82 U/L (ref 38–126)
Anion gap: 8 (ref 5–15)
BUN: 17 mg/dL (ref 8–23)
CO2: 24 mmol/L (ref 22–32)
Calcium: 9 mg/dL (ref 8.9–10.3)
Chloride: 107 mmol/L (ref 98–111)
Creatinine: 0.77 mg/dL (ref 0.44–1.00)
GFR, Estimated: >60 mL/min (ref >60)
Glucose, Bld: 123 mg/dL — ABNORMAL HIGH (ref 70–99)
Potassium: 3.9 mmol/L (ref 3.5–5.1)
Sodium: 139 mmol/L (ref 135–145)
Total Bilirubin: 0.8 mg/dL (ref 0.3–1.2)
Total Protein: 6.7 g/dL (ref 6.5–8.1)

## 2021-05-22 MED ORDER — SODIUM CHLORIDE 0.9% FLUSH
10.0000 mL | INTRAVENOUS | Status: DC | PRN
  Administered 2021-05-22: 10 mL
  Filled 2021-05-22: qty 10

## 2021-05-22 MED ORDER — HEPARIN SOD (PORK) LOCK FLUSH 100 UNIT/ML IV SOLN
500.0000 [IU] | Freq: Once | INTRAVENOUS | Status: AC | PRN
  Administered 2021-05-22: 500 [IU]
  Filled 2021-05-22: qty 5

## 2021-05-22 MED ORDER — SODIUM CHLORIDE 0.9 % IV SOLN
480.0000 mg | Freq: Once | INTRAVENOUS | Status: AC
  Administered 2021-05-22: 480 mg via INTRAVENOUS
  Filled 2021-05-22: qty 48

## 2021-05-22 MED ORDER — SODIUM CHLORIDE 0.9 % IV SOLN
Freq: Once | INTRAVENOUS | Status: AC
  Filled 2021-05-22: qty 250

## 2021-05-22 MED ORDER — SODIUM CHLORIDE 0.9% FLUSH
10.0000 mL | Freq: Once | INTRAVENOUS | Status: AC
Start: 2021-05-22 — End: 2021-05-22
  Administered 2021-05-22: 10 mL
  Filled 2021-05-22: qty 10

## 2021-05-22 NOTE — Patient Instructions (Signed)
[facility] Discharge Instructions for Patients Receiving Chemotherapy  Today you received the following chemotherapy agents opdivo   To help prevent nausea and vomiting after your treatment, we encourage you to take your nausea medication as directed.     If you develop nausea and vomiting that is not controlled by your nausea medication, call the clinic.   BELOW ARE SYMPTOMS THAT SHOULD BE REPORTED IMMEDIATELY: *FEVER GREATER THAN 100.5 F *CHILLS WITH OR WITHOUT FEVER NAUSEA AND VOMITING THAT IS NOT CONTROLLED WITH YOUR NAUSEA MEDICATION *UNUSUAL SHORTNESS OF BREATH *UNUSUAL BRUISING OR BLEEDING TENDERNESS IN MOUTH AND THROAT WITH OR WITHOUT PRESENCE OF ULCERS *URINARY PROBLEMS *BOWEL PROBLEMS UNUSUAL RASH Items with * indicate a potential emergency and should be followed up as soon as possible.  Feel free to call the clinic you have any questions or concerns. The clinic phone number is [phone].

## 2021-05-29 ENCOUNTER — Encounter: Payer: Self-pay | Admitting: Oncology

## 2021-05-29 NOTE — Telephone Encounter (Signed)
Error. Gardiner Rhyme, RN

## 2021-06-18 ENCOUNTER — Inpatient Hospital Stay: Payer: Medicare Other

## 2021-06-18 ENCOUNTER — Inpatient Hospital Stay: Payer: Medicare Other | Attending: Oncology

## 2021-06-18 ENCOUNTER — Other Ambulatory Visit: Payer: Self-pay

## 2021-06-18 ENCOUNTER — Inpatient Hospital Stay: Payer: Medicare Other | Admitting: Oncology

## 2021-06-18 VITALS — BP 138/68 | HR 78 | Temp 96.7°F | Resp 18 | Ht 63.0 in | Wt 112.6 lb

## 2021-06-18 DIAGNOSIS — Z79899 Other long term (current) drug therapy: Secondary | ICD-10-CM | POA: Insufficient documentation

## 2021-06-18 DIAGNOSIS — C680 Malignant neoplasm of urethra: Secondary | ICD-10-CM | POA: Insufficient documentation

## 2021-06-18 DIAGNOSIS — Z95828 Presence of other vascular implants and grafts: Secondary | ICD-10-CM

## 2021-06-18 DIAGNOSIS — Z5112 Encounter for antineoplastic immunotherapy: Secondary | ICD-10-CM | POA: Diagnosis not present

## 2021-06-18 LAB — CMP (CANCER CENTER ONLY)
ALT: 16 U/L (ref 0–44)
AST: 28 U/L (ref 15–41)
Albumin: 4 g/dL (ref 3.5–5.0)
Alkaline Phosphatase: 76 U/L (ref 38–126)
Anion gap: 9 (ref 5–15)
BUN: 13 mg/dL (ref 8–23)
CO2: 24 mmol/L (ref 22–32)
Calcium: 9.2 mg/dL (ref 8.9–10.3)
Chloride: 105 mmol/L (ref 98–111)
Creatinine: 0.84 mg/dL (ref 0.44–1.00)
GFR, Estimated: >60 mL/min (ref >60)
Glucose, Bld: 87 mg/dL (ref 70–99)
Potassium: 4 mmol/L (ref 3.5–5.1)
Sodium: 138 mmol/L (ref 135–145)
Total Bilirubin: 0.9 mg/dL (ref 0.3–1.2)
Total Protein: 7.1 g/dL (ref 6.5–8.1)

## 2021-06-18 LAB — CBC WITH DIFFERENTIAL (CANCER CENTER ONLY)
Abs Immature Granulocytes: 0.01 10*3/uL (ref 0.00–0.07)
Basophils Absolute: 0.1 10*3/uL (ref 0.0–0.1)
Basophils Relative: 1 %
Eosinophils Absolute: 0.1 10*3/uL (ref 0.0–0.5)
Eosinophils Relative: 1 %
HCT: 33.5 % — ABNORMAL LOW (ref 36.0–46.0)
Hemoglobin: 11.6 g/dL — ABNORMAL LOW (ref 12.0–15.0)
Immature Granulocytes: 0 %
Lymphocytes Relative: 25 %
Lymphs Abs: 1.7 10*3/uL (ref 0.7–4.0)
MCH: 31.5 pg (ref 26.0–34.0)
MCHC: 34.6 g/dL (ref 30.0–36.0)
MCV: 91 fL (ref 80.0–100.0)
Monocytes Absolute: 1 10*3/uL (ref 0.1–1.0)
Monocytes Relative: 14 %
Neutro Abs: 4.1 10*3/uL (ref 1.7–7.7)
Neutrophils Relative %: 59 %
Platelet Count: 220 10*3/uL (ref 150–400)
RBC: 3.68 MIL/uL — ABNORMAL LOW (ref 3.87–5.11)
RDW: 16.3 % — ABNORMAL HIGH (ref 11.5–15.5)
WBC Count: 7 10*3/uL (ref 4.0–10.5)
nRBC: 0 % (ref 0.0–0.2)

## 2021-06-18 LAB — TSH: TSH: 1.483 u[IU]/mL (ref 0.308–3.960)

## 2021-06-18 MED ORDER — SODIUM CHLORIDE 0.9% FLUSH
10.0000 mL | INTRAVENOUS | Status: DC | PRN
  Administered 2021-06-18: 10 mL

## 2021-06-18 MED ORDER — SODIUM CHLORIDE 0.9% FLUSH
10.0000 mL | Freq: Once | INTRAVENOUS | Status: AC
  Administered 2021-06-18: 10 mL

## 2021-06-18 MED ORDER — SODIUM CHLORIDE 0.9 % IV SOLN: Freq: Once | INTRAVENOUS | Status: AC

## 2021-06-18 MED ORDER — HEPARIN SOD (PORK) LOCK FLUSH 100 UNIT/ML IV SOLN
500.0000 [IU] | Freq: Once | INTRAVENOUS | Status: AC | PRN
  Administered 2021-06-18: 500 [IU]

## 2021-06-18 MED ORDER — SODIUM CHLORIDE 0.9 % IV SOLN
480.0000 mg | Freq: Once | INTRAVENOUS | Status: AC
  Administered 2021-06-18: 480 mg via INTRAVENOUS
  Filled 2021-06-18: qty 48

## 2021-06-18 NOTE — Progress Notes (Signed)
Hematology and Oncology Follow Up Visit  Sandra Mann [id number] 11-30-1950 70 y.o. 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  She was found to have T4N1 disease after surgical resection.     Prior Therapy: She is status post cystoscopy and biopsy in November 2021 which showed high-grade urothelial carcinoma. Neoadjuvant chemotherapy utilizing gemcitabine and cisplatin started on October 29, 2020.  She completed 4 cycles of therapy on January 07, 2021. She is status post robotic assisted laparoscopic cystectomy, hysterectomy and lymph node dissection completed on Mar 04, 2021 completed by Dr. Tammi Klippel.  He final pathology showed T4N1 disease.  She had 1 out of 16 lymph node involvement.  Current therapy: Nivolumab 480 mg every 4 weeks started in July 2022.  She is here for cycle 2 of therapy.   Interim History: Ms. Sherman returns today for a follow-up visit.  Since last visit, she completed the first cycle of nivolumab without any major complaints.  She denies any nausea, vomiting or abdominal pain.  She denies any worsening arthritis or joint stiffness.  Her performance status quality of life remained excellent.  Medications: Updated on review. Current Outpatient Medications  Medication Sig Dispense Refill   cycloSPORINE (RESTASIS) 0.05 % ophthalmic emulsion Place 1 drop into both eyes 2 (two) times daily.     folic acid (FOLVITE) 726 MCG tablet Take 1,600 mcg by mouth daily.      lidocaine-prilocaine (EMLA) cream Apply 1 application topically as needed. (Patient taking differently: Apply 1 application topically as needed (for the port).) 30 g 0   magnesium hydroxide (MILK OF MAGNESIA) 400 MG/5ML suspension Take 5 mLs by mouth daily as needed for mild constipation. 355 mL 0   mesalamine (CANASA) 1000 MG suppository Place 1,000 mg rectally every other day. At  bedtime     Multiple Vitamin (MULTIVITAMIN WITH MINERALS) TABS tablet Take 1 tablet by mouth at bedtime. Silver     omeprazole (PRILOSEC) 40 MG capsule Take 40 mg by mouth in the morning.      prochlorperazine (COMPAZINE) 10 MG tablet Take 1 tablet (10 mg total) by mouth every 6 (six) hours as needed for nausea or vomiting. (Patient not taking: Reported on 02/18/2021) 30 tablet 0   senna-docusate (SENNA S) 8.6-50 MG tablet Take 1 tablet by mouth 2 (two) times daily. (Patient taking differently: Take 1 tablet by mouth at bedtime.) 60 tablet 3   traMADol (ULTRAM) 50 MG tablet Take 1-2 tablets (50-100 mg total) by mouth every 6 (six) hours as needed for moderate pain. 20 tablet 0   No current facility-administered medications for this visit.     Allergies:  Allergies  Allergen Reactions   Codeine Nausea And Vomiting   Sulfa Antibiotics Nausea And Vomiting   Vicodin [Hydrocodone-Acetaminophen] Nausea And Vomiting      Physical Exam:   Blood pressure 138/68, pulse 78, temperature (!) 96.7 F (35.9 C), temperature source Oral, resp. rate 18, height 5' 3"  (1.6 m), weight 112 lb 9.6 oz (51.1 kg), SpO2 100 %.     ECOG: 0   General appearance: Comfortable appearing without any discomfort Head: Normocephalic without any trauma Oropharynx: Mucous membranes are moist and pink without any thrush or ulcers. Eyes: Pupils are equal and round reactive to light. Lymph nodes: No cervical, supraclavicular, inguinal or axillary lymphadenopathy.   Heart:regular rate and rhythm.  S1 and S2 without leg edema.  Lung: Clear without any rhonchi or wheezes.  No dullness to percussion. Abdomin: Soft, nontender, nondistended with good bowel sounds.  No hepatosplenomegaly. Musculoskeletal: No joint deformity or effusion.  Full range of motion noted. Neurological: No deficits noted on motor, sensory and deep tendon reflex exam. Skin: No petechial rash or dryness.  Appeared moist.           Lab  Results: Lab Results  Component Value Date   WBC 7.0 06/18/2021   HGB 11.6 (L) 06/18/2021   HCT 33.5 (L) 06/18/2021   MCV 91.0 06/18/2021   PLT 220 06/18/2021     Chemistry      Component Value Date/Time   NA 139 05/22/2021 1344   K 3.9 05/22/2021 1344   CL 107 05/22/2021 1344   CO2 24 05/22/2021 1344   BUN 17 05/22/2021 1344   CREATININE 0.77 05/22/2021 1344      Component Value Date/Time   CALCIUM 9.0 05/22/2021 1344   ALKPHOS 82 05/22/2021 1344   AST 27 05/22/2021 1344   ALT 15 05/22/2021 1344   BILITOT 0.8 05/22/2021 1344        Impression and Plan:  70 year old woman with:   1.    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  Her disease status was updated at this time and treatment choices were reviewed.  Risks and benefits of continuing nivolumab were also discussed.  Risk of relapse was reiterated.  Complications from this treatment that include nausea, fatigue and immune mediated complaints were discussed.  She is agreeable to continue and we will update her staging scans before the next visit.    2.  IV access: Port-A-Cath currently in place without any issues.   3.  Antiemetics: No nausea or vomiting reported at this time Compazine is available to her.   4.  Immune mediated complications: She has not experienced any exacerbation of her arthritis.  We will continue to monitor at this time.  Other autoimmune complications including pneumonitis, hepatitis among others were reviewed.   5.  Goals of care: Aggressive measures are warranted at this time.  Purposes.   6.  Follow-up: In 4 weeks for the next cycle of therapy.   30  minutes were spent on this encounter.  The time was dedicated to reviewing laboratory data, disease status update, addressing complications related to cancer and cancer therapy.  [name], MD 8/18/20228:42 AM

## 2021-07-10 ENCOUNTER — Other Ambulatory Visit: Payer: Self-pay

## 2021-07-10 ENCOUNTER — Ambulatory Visit (HOSPITAL_COMMUNITY)
Admission: RE | Admit: 2021-07-10 | Discharge: 2021-07-10 | Disposition: A | Discharge status: Home / Self Care | Payer: Medicare Other | Source: Ambulatory Visit | Attending: Oncology | Admitting: Oncology

## 2021-07-10 DIAGNOSIS — C680 Malignant neoplasm of urethra: Secondary | ICD-10-CM | POA: Insufficient documentation

## 2021-07-10 DIAGNOSIS — Z95828 Presence of other vascular implants and grafts: Secondary | ICD-10-CM | POA: Insufficient documentation

## 2021-07-10 MED ORDER — IOHEXOL 350 MG/ML SOLN
80.0000 mL | Freq: Once | INTRAVENOUS | Status: AC | PRN
  Administered 2021-07-10: 80 mL via INTRAVENOUS

## 2021-07-16 ENCOUNTER — Inpatient Hospital Stay: Payer: Medicare Other | Admitting: Oncology

## 2021-07-16 ENCOUNTER — Inpatient Hospital Stay: Payer: Medicare Other | Attending: Oncology

## 2021-07-16 ENCOUNTER — Other Ambulatory Visit: Payer: Self-pay

## 2021-07-16 ENCOUNTER — Inpatient Hospital Stay: Payer: Medicare Other

## 2021-07-16 VITALS — BP 139/71 | HR 58 | Temp 98.2°F | Resp 17 | Ht 63.0 in | Wt 114.2 lb

## 2021-07-16 DIAGNOSIS — Z79899 Other long term (current) drug therapy: Secondary | ICD-10-CM | POA: Insufficient documentation

## 2021-07-16 DIAGNOSIS — Z5112 Encounter for antineoplastic immunotherapy: Secondary | ICD-10-CM | POA: Insufficient documentation

## 2021-07-16 DIAGNOSIS — C680 Malignant neoplasm of urethra: Secondary | ICD-10-CM

## 2021-07-16 DIAGNOSIS — Z95828 Presence of other vascular implants and grafts: Secondary | ICD-10-CM

## 2021-07-16 LAB — CMP (CANCER CENTER ONLY)
ALT: 18 U/L (ref 0–44)
AST: 26 U/L (ref 15–41)
Albumin: 3.9 g/dL (ref 3.5–5.0)
Alkaline Phosphatase: 74 U/L (ref 38–126)
Anion gap: 9 (ref 5–15)
BUN: 14 mg/dL (ref 8–23)
CO2: 23 mmol/L (ref 22–32)
Calcium: 8.9 mg/dL (ref 8.9–10.3)
Chloride: 106 mmol/L (ref 98–111)
Creatinine: 0.8 mg/dL (ref 0.44–1.00)
GFR, Estimated: >60 mL/min (ref >60)
Glucose, Bld: 92 mg/dL (ref 70–99)
Potassium: 4 mmol/L (ref 3.5–5.1)
Sodium: 138 mmol/L (ref 135–145)
Total Bilirubin: 0.9 mg/dL (ref 0.3–1.2)
Total Protein: 6.8 g/dL (ref 6.5–8.1)

## 2021-07-16 LAB — CBC WITH DIFFERENTIAL (CANCER CENTER ONLY)
Abs Immature Granulocytes: 0.02 10*3/uL (ref 0.00–0.07)
Basophils Absolute: 0 10*3/uL (ref 0.0–0.1)
Basophils Relative: 1 %
Eosinophils Absolute: 0.1 10*3/uL (ref 0.0–0.5)
Eosinophils Relative: 2 %
HCT: 33.4 % — ABNORMAL LOW (ref 36.0–46.0)
Hemoglobin: 11.6 g/dL — ABNORMAL LOW (ref 12.0–15.0)
Immature Granulocytes: 0 %
Lymphocytes Relative: 19 %
Lymphs Abs: 1.3 10*3/uL (ref 0.7–4.0)
MCH: 32.6 pg (ref 26.0–34.0)
MCHC: 34.7 g/dL (ref 30.0–36.0)
MCV: 93.8 fL (ref 80.0–100.0)
Monocytes Absolute: 0.9 10*3/uL (ref 0.1–1.0)
Monocytes Relative: 13 %
Neutro Abs: 4.5 10*3/uL (ref 1.7–7.7)
Neutrophils Relative %: 65 %
Platelet Count: 213 10*3/uL (ref 150–400)
RBC: 3.56 MIL/uL — ABNORMAL LOW (ref 3.87–5.11)
RDW: 14.9 % (ref 11.5–15.5)
WBC Count: 6.9 10*3/uL (ref 4.0–10.5)
nRBC: 0 % (ref 0.0–0.2)

## 2021-07-16 LAB — TSH: TSH: 1.307 u[IU]/mL (ref 0.308–3.960)

## 2021-07-16 MED ORDER — HEPARIN SOD (PORK) LOCK FLUSH 100 UNIT/ML IV SOLN
500.0000 [IU] | Freq: Once | INTRAVENOUS | Status: AC | PRN
  Administered 2021-07-16: 500 [IU]

## 2021-07-16 MED ORDER — SODIUM CHLORIDE 0.9 % IV SOLN
480.0000 mg | Freq: Once | INTRAVENOUS | Status: AC
  Administered 2021-07-16: 480 mg via INTRAVENOUS
  Filled 2021-07-16: qty 48

## 2021-07-16 MED ORDER — SODIUM CHLORIDE 0.9% FLUSH
10.0000 mL | INTRAVENOUS | Status: DC | PRN
  Administered 2021-07-16: 10 mL

## 2021-07-16 MED ORDER — SODIUM CHLORIDE 0.9% FLUSH
10.0000 mL | Freq: Once | INTRAVENOUS | Status: AC
  Administered 2021-07-16: 10 mL

## 2021-07-16 MED ORDER — SODIUM CHLORIDE 0.9 % IV SOLN: Freq: Once | INTRAVENOUS | Status: AC

## 2021-07-16 NOTE — Patient Instructions (Signed)
[facility]   Discharge Instructions: Thank you for choosing [facility] to provide your oncology and hematology care.   If you have a lab appointment with the [facility], please go directly to the [facility] and check in at the registration area.   Wear comfortable clothing and clothing appropriate for easy access to any Portacath or PICC line.   We strive to give you quality time with your provider. You may need to reschedule your appointment if you arrive late (15 or more minutes).  Arriving late affects you and other patients whose appointments are after yours.  Also, if you miss three or more appointments without notifying the office, you may be dismissed from the clinic at the provider's discretion.      For prescription refill requests, have your pharmacy contact our office and allow 72 hours for refills to be completed.    Today you received the following chemotherapy and/or immunotherapy agents: nivolumab.      To help prevent nausea and vomiting after your treatment, we encourage you to take your nausea medication as directed.  BELOW ARE SYMPTOMS THAT SHOULD BE REPORTED IMMEDIATELY: *FEVER GREATER THAN 100.4 F (38 C) OR HIGHER *CHILLS OR SWEATING *NAUSEA AND VOMITING THAT IS NOT CONTROLLED WITH YOUR NAUSEA MEDICATION *UNUSUAL SHORTNESS OF BREATH *UNUSUAL BRUISING OR BLEEDING *URINARY PROBLEMS (pain or burning when urinating, or frequent urination) *BOWEL PROBLEMS (unusual diarrhea, constipation, pain near the anus) TENDERNESS IN MOUTH AND THROAT WITH OR WITHOUT PRESENCE OF ULCERS (sore throat, sores in mouth, or a toothache) UNUSUAL RASH, SWELLING OR PAIN  UNUSUAL VAGINAL DISCHARGE OR ITCHING   Items with * indicate a potential emergency and should be followed up as soon as possible or go to the Emergency Department if any problems should occur.  Please show the CHEMOTHERAPY ALERT CARD or IMMUNOTHERAPY ALERT CARD at check-in  to the Emergency Department and triage nurse.  Should you have questions after your visit or need to cancel or reschedule your appointment, please contact [facility]  Dept: [phone]  and follow the prompts.  Office hours are 8:00 a.m. to 4:30 p.m. Monday - Friday. Please note that voicemails left after 4:00 p.m. may not be returned until the following business day.  We are closed weekends and major holidays. You have access to a nurse at all times for urgent questions. Please call the main number to the clinic Dept: [phone] and follow the prompts.   For any non-urgent questions, you may also contact your provider using MyChart. We now offer e-Visits for anyone 46 and older to request care online for non-urgent symptoms. For details visit [email or web address].   Also download the MyChart app! Go to the app store, search "MyChart", open the app, select [facility], and log in with your MyChart username and password.  Due to Covid, a mask is required upon entering the hospital/clinic. If you do not have a mask, one will be given to you upon arrival. For doctor visits, patients may have 1 support person aged 68 or older with them. For treatment visits, patients cannot have anyone with them due to current Covid guidelines and our immunocompromised population.

## 2021-07-16 NOTE — Progress Notes (Signed)
Hematology and Oncology Follow Up Visit  Sandra Mann [id number] Jun 02, 1951 70 y.o. 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     Prior Therapy: She is status post cystoscopy and biopsy in November 2021 which showed high-grade urothelial carcinoma. Neoadjuvant chemotherapy utilizing gemcitabine and cisplatin started on October 29, 2020.  She completed 4 cycles of therapy on January 07, 2021. She is status post robotic assisted laparoscopic cystectomy, hysterectomy and lymph node dissection completed on Mar 04, 2021 completed by Dr. Tammi Mann.  He final pathology showed T4N1 disease.  She had 1 out of 16 lymph node involvement.  Current therapy: Nivolumab 480 mg every 4 weeks started in July 2022.  She is here for cycle 3 of therapy.   Interim History: Sandra Mann returns today for a follow-up evaluation.  Since her last visit, she reports no Mann changes in her health.  She denies any recent hospitalizations or illnesses.  She denies any abdominal pain or distention.  She denies any skin rash or pruritus.  She denies any excessive fatigue.  She is eating better and gained 1 pound since the last time.  No complications related to nivolumab.  Medications: Reviewed and updated. Current Outpatient Medications  Medication Sig Dispense Refill   cycloSPORINE (RESTASIS) 0.05 % ophthalmic emulsion Place 1 drop into both eyes 2 (two) times daily.     folic acid (FOLVITE) 188 MCG tablet Take 1,600 mcg by mouth daily.      lidocaine-prilocaine (EMLA) cream Apply 1 application topically as needed. (Patient taking differently: Apply 1 application topically as needed (for the port).) 30 g 0   magnesium hydroxide (MILK OF MAGNESIA) 400 MG/5ML suspension Take 5 mLs by mouth daily as needed for mild constipation. 355 mL 0   mesalamine (CANASA) 1000 MG suppository Place  1,000 mg rectally every other day. At bedtime     Multiple Vitamin (MULTIVITAMIN WITH MINERALS) TABS tablet Take 1 tablet by mouth at bedtime. Silver     omeprazole (PRILOSEC) 40 MG capsule Take 40 mg by mouth in the morning.      prochlorperazine (COMPAZINE) 10 MG tablet Take 1 tablet (10 mg total) by mouth every 6 (six) hours as needed for nausea or vomiting. (Patient not taking: Reported on 02/18/2021) 30 tablet 0   senna-docusate (SENNA S) 8.6-50 MG tablet Take 1 tablet by mouth 2 (two) times daily. (Patient taking differently: Take 1 tablet by mouth at bedtime.) 60 tablet 3   traMADol (ULTRAM) 50 MG tablet Take 1-2 tablets (50-100 mg total) by mouth every 6 (six) hours as needed for moderate pain. 20 tablet 0   No current facility-administered medications for this visit.     Allergies:  Allergies  Allergen Reactions   Codeine Nausea And Vomiting   Sulfa Antibiotics Nausea And Vomiting   Vicodin [Hydrocodone-Acetaminophen] Nausea And Vomiting      Physical Exam:    Blood pressure 139/71, pulse (!) 58, temperature 98.2 F (36.8 C), temperature source Oral, resp. rate 17, height 5' 3"  (1.6 m), weight 114 lb 3.2 oz (51.8 kg), SpO2 100 %.     ECOG: 0   General appearance: Alert, awake without any distress. Head: Atraumatic without abnormalities Oropharynx: Without any thrush or ulcers. Eyes: No scleral icterus. Lymph nodes: No lymphadenopathy noted in the cervical, supraclavicular, or axillary nodes Heart:regular rate and rhythm, without any murmurs or gallops.  Lung: Clear to auscultation without any rhonchi, wheezes or dullness to percussion. Abdomin: Soft, nontender without any shifting dullness or ascites. Musculoskeletal: No clubbing or cyanosis. Neurological: No motor or sensory deficits. Skin: No rashes or lesions.          Lab Results: Lab Results  Component Value Date   WBC 7.0 06/18/2021   HGB 11.6 (L) 06/18/2021   HCT 33.5 (L) 06/18/2021   MCV  91.0 06/18/2021   PLT 220 06/18/2021     Chemistry      Component Value Date/Time   NA 138 06/18/2021 0801   K 4.0 06/18/2021 0801   CL 105 06/18/2021 0801   CO2 24 06/18/2021 0801   BUN 13 06/18/2021 0801   CREATININE 0.84 06/18/2021 0801      Component Value Date/Time   CALCIUM 9.2 06/18/2021 0801   ALKPHOS 76 06/18/2021 0801   AST 28 06/18/2021 0801   ALT 16 06/18/2021 0801   BILITOT 0.9 06/18/2021 0801     IMPRESSION: 1. Status post interval cystectomy with right lower quadrant urinary diversion. 2. No evidence of recurrent or metastatic disease in the chest, abdomen, or pelvis. 3. Coronary artery disease.   Aortic Atherosclerosis (ICD10-I70.0).   Impression and Plan:  70 year old woman with:   1.   Ureteral cancer diagnosed in 2021.  She was found to have T4N1 high-grade urothelial carcinoma after surgical resection.  She is currently receiving adjuvant nivolumab given her high risk of residual disease.  Complication associated with this treatment long-term were reviewed including immune mediated issues, GI toxicity among others.  CT scan obtained on 07/10/2021 was personally reviewed and showed no evidence of metastatic disease.  Given her high risk of relapse we will continue to monitor this closely.  Different salvage therapy options including chemotherapy and antibiotic drug conjugate will be deferred unless he has relapsed disease.   2.  IV access: Port-A-Cath remains in place without any issues.   3.  Antiemetics: Compazine is available to her without any nausea or vomiting.   4.  Immune mediated complications: I continue to educate her about potential issues including pneumonitis, colitis, arthritis among others.   5.  Goals of care: Treatment remains curative at this time.    6.  Follow-up:  she will return in 1 month for a follow-up.   30  minutes were spent on this visit.  Time was dedicated to reviewing laboratory data, disease status update, reviewing  imaging studies and future plan of care discussion.  Sandra Button, MD 9/15/20228:04 AM

## 2021-08-13 ENCOUNTER — Inpatient Hospital Stay: Payer: Medicare Other

## 2021-08-13 ENCOUNTER — Other Ambulatory Visit: Payer: Self-pay

## 2021-08-13 ENCOUNTER — Inpatient Hospital Stay (HOSPITAL_BASED_OUTPATIENT_CLINIC_OR_DEPARTMENT_OTHER): Payer: Medicare Other | Admitting: Oncology

## 2021-08-13 ENCOUNTER — Inpatient Hospital Stay: Payer: Medicare Other | Attending: Oncology

## 2021-08-13 VITALS — BP 137/69 | HR 71 | Temp 96.8°F | Resp 17 | Ht 63.0 in | Wt 117.1 lb

## 2021-08-13 DIAGNOSIS — C680 Malignant neoplasm of urethra: Secondary | ICD-10-CM

## 2021-08-13 DIAGNOSIS — Z5112 Encounter for antineoplastic immunotherapy: Secondary | ICD-10-CM | POA: Insufficient documentation

## 2021-08-13 DIAGNOSIS — Z79899 Other long term (current) drug therapy: Secondary | ICD-10-CM | POA: Diagnosis not present

## 2021-08-13 DIAGNOSIS — Z95828 Presence of other vascular implants and grafts: Secondary | ICD-10-CM

## 2021-08-13 LAB — CBC WITH DIFFERENTIAL (CANCER CENTER ONLY)
Abs Immature Granulocytes: 0.02 10*3/uL (ref 0.00–0.07)
Basophils Absolute: 0.1 10*3/uL (ref 0.0–0.1)
Basophils Relative: 1 %
Eosinophils Absolute: 0.1 10*3/uL (ref 0.0–0.5)
Eosinophils Relative: 2 %
HCT: 35 % — ABNORMAL LOW (ref 36.0–46.0)
Hemoglobin: 11.9 g/dL — ABNORMAL LOW (ref 12.0–15.0)
Immature Granulocytes: 0 %
Lymphocytes Relative: 25 %
Lymphs Abs: 1.5 10*3/uL (ref 0.7–4.0)
MCH: 32.1 pg (ref 26.0–34.0)
MCHC: 34 g/dL (ref 30.0–36.0)
MCV: 94.3 fL (ref 80.0–100.0)
Monocytes Absolute: 1 10*3/uL (ref 0.1–1.0)
Monocytes Relative: 16 %
Neutro Abs: 3.4 10*3/uL (ref 1.7–7.7)
Neutrophils Relative %: 56 %
Platelet Count: 242 10*3/uL (ref 150–400)
RBC: 3.71 MIL/uL — ABNORMAL LOW (ref 3.87–5.11)
RDW: 14 % (ref 11.5–15.5)
WBC Count: 6.1 10*3/uL (ref 4.0–10.5)
nRBC: 0 % (ref 0.0–0.2)

## 2021-08-13 LAB — TSH: TSH: 1.683 u[IU]/mL (ref 0.308–3.960)

## 2021-08-13 LAB — CMP (CANCER CENTER ONLY)
ALT: 18 U/L (ref 0–44)
AST: 29 U/L (ref 15–41)
Albumin: 4 g/dL (ref 3.5–5.0)
Alkaline Phosphatase: 70 U/L (ref 38–126)
Anion gap: 7 (ref 5–15)
BUN: 14 mg/dL (ref 8–23)
CO2: 25 mmol/L (ref 22–32)
Calcium: 8.9 mg/dL (ref 8.9–10.3)
Chloride: 103 mmol/L (ref 98–111)
Creatinine: 0.66 mg/dL (ref 0.44–1.00)
GFR, Estimated: >60 mL/min (ref >60)
Glucose, Bld: 79 mg/dL (ref 70–99)
Potassium: 4 mmol/L (ref 3.5–5.1)
Sodium: 135 mmol/L (ref 135–145)
Total Bilirubin: 0.8 mg/dL (ref 0.3–1.2)
Total Protein: 7 g/dL (ref 6.5–8.1)

## 2021-08-13 MED ORDER — SODIUM CHLORIDE 0.9% FLUSH
10.0000 mL | INTRAVENOUS | Status: DC | PRN
Start: 2021-08-13 — End: 2021-08-13
  Administered 2021-08-13: 10 mL

## 2021-08-13 MED ORDER — SODIUM CHLORIDE 0.9% FLUSH
10.0000 mL | Freq: Once | INTRAVENOUS | Status: AC
  Administered 2021-08-13: 10 mL

## 2021-08-13 MED ORDER — SODIUM CHLORIDE 0.9 % IV SOLN: Freq: Once | INTRAVENOUS | Status: AC

## 2021-08-13 MED ORDER — SODIUM CHLORIDE 0.9 % IV SOLN
480.0000 mg | Freq: Once | INTRAVENOUS | Status: AC
  Administered 2021-08-13: 480 mg via INTRAVENOUS
  Filled 2021-08-13: qty 48

## 2021-08-13 MED ORDER — HEPARIN SOD (PORK) LOCK FLUSH 100 UNIT/ML IV SOLN
500.0000 [IU] | Freq: Once | INTRAVENOUS | Status: AC | PRN
  Administered 2021-08-13: 500 [IU]

## 2021-08-13 NOTE — Patient Instructions (Signed)
[facility]  Discharge Instructions: Thank you for choosing [facility] to provide your oncology and hematology care.   If you have a lab appointment with the [facility], please go directly to the [facility] and check in at the registration area.   Wear comfortable clothing and clothing appropriate for easy access to any Portacath or PICC line.   We strive to give you quality time with your provider. You may need to reschedule your appointment if you arrive late (15 or more minutes).  Arriving late affects you and other patients whose appointments are after yours.  Also, if you miss three or more appointments without notifying the office, you may be dismissed from the clinic at the provider's discretion.      For prescription refill requests, have your pharmacy contact our office and allow 72 hours for refills to be completed.    Today you received the following chemotherapy and/or immunotherapy agents Opdivo      To help prevent nausea and vomiting after your treatment, we encourage you to take your nausea medication as directed.  BELOW ARE SYMPTOMS THAT SHOULD BE REPORTED IMMEDIATELY: *FEVER GREATER THAN 100.4 F (38 C) OR HIGHER *CHILLS OR SWEATING *NAUSEA AND VOMITING THAT IS NOT CONTROLLED WITH YOUR NAUSEA MEDICATION *UNUSUAL SHORTNESS OF BREATH *UNUSUAL BRUISING OR BLEEDING *URINARY PROBLEMS (pain or burning when urinating, or frequent urination) *BOWEL PROBLEMS (unusual diarrhea, constipation, pain near the anus) TENDERNESS IN MOUTH AND THROAT WITH OR WITHOUT PRESENCE OF ULCERS (sore throat, sores in mouth, or a toothache) UNUSUAL RASH, SWELLING OR PAIN  UNUSUAL VAGINAL DISCHARGE OR ITCHING   Items with * indicate a potential emergency and should be followed up as soon as possible or go to the Emergency Department if any problems should occur.  Please show the CHEMOTHERAPY ALERT CARD or IMMUNOTHERAPY ALERT CARD at check-in to the  Emergency Department and triage nurse.  Should you have questions after your visit or need to cancel or reschedule your appointment, please contact [facility]  Dept: [phone]  and follow the prompts.  Office hours are 8:00 a.m. to 4:30 p.m. Monday - Friday. Please note that voicemails left after 4:00 p.m. may not be returned until the following business day.  We are closed weekends and major holidays. You have access to a nurse at all times for urgent questions. Please call the main number to the clinic Dept: [phone] and follow the prompts.   For any non-urgent questions, you may also contact your provider using MyChart. We now offer e-Visits for anyone 68 and older to request care online for non-urgent symptoms. For details visit [email or web address].   Also download the MyChart app! Go to the app store, search "MyChart", open the app, select [facility], and log in with your MyChart username and password.  Due to Covid, a mask is required upon entering the hospital/clinic. If you do not have a mask, one will be given to you upon arrival. For doctor visits, patients may have 1 support person aged 26 or older with them. For treatment visits, patients cannot have anyone with them due to current Covid guidelines and our immunocompromised population.

## 2021-08-13 NOTE — Progress Notes (Signed)
Hematology and Oncology Follow Up Visit  Sandra Mann [id number] 1951-05-14 70 y.o. 08/13/2021 8:11 AM Sandra Mann, MDEksir, [name], MD   Principle Diagnosis: 70 year old woman with ureteral high-grade urothelial cancer  diagnosed in November 2021.  She was found to have T4N1 disease.   Prior Therapy: She is status post cystoscopy and biopsy in November 2021 which showed high-grade urothelial carcinoma. Neoadjuvant chemotherapy utilizing gemcitabine and cisplatin started on October 29, 2020.  She completed 4 cycles of therapy on January 07, 2021. She is status post robotic assisted laparoscopic cystectomy, hysterectomy and lymph node dissection completed on Mar 04, 2021 completed by Dr. Tammi Klippel.  He final pathology showed T4N1 disease.  She had 1 out of 16 lymph node involvement.  Current therapy: Nivolumab 480 mg every 4 weeks started in July 2022.  She is here for cycle 4 of therapy.   Interim History: [name] presents today for repeat follow-up.  Since the last visit, she reports no Mann changes in her health.  She has reported some occasional irritation around her right chest wall and shoulder with pruritus but no rash.  She denies any nausea, vomiting or abdominal pain.  She denies any hematochezia or melena.  She is eating well without any Mann issues.  Medications: Updated on review. Current Outpatient Medications  Medication Sig Dispense Refill   cycloSPORINE (RESTASIS) 0.05 % ophthalmic emulsion Place 1 drop into both eyes 2 (two) times daily.     docusate sodium (COLACE) 100 MG capsule Take 100 mg by mouth daily.     folic acid (FOLVITE) 250 MCG tablet Take 1,600 mcg by mouth daily.      lidocaine-prilocaine (EMLA) cream Apply 1 application topically as needed. (Patient taking differently: Apply 1 application topically as needed (for the port).) 30 g 0   magnesium hydroxide (MILK OF MAGNESIA) 400 MG/5ML suspension Take 5 mLs by mouth daily as needed for mild  constipation. 355 mL 0   Melatonin 10 MG TABS Take by mouth at bedtime.     mesalamine (CANASA) 1000 MG suppository Place 1,000 mg rectally every other day. At bedtime     Multiple Vitamin (MULTIVITAMIN WITH MINERALS) TABS tablet Take 1 tablet by mouth at bedtime. Silver     omeprazole (PRILOSEC) 40 MG capsule Take 40 mg by mouth in the morning.      prochlorperazine (COMPAZINE) 10 MG tablet Take 1 tablet (10 mg total) by mouth every 6 (six) hours as needed for nausea or vomiting. 30 tablet 0   senna-docusate (SENNA S) 8.6-50 MG tablet Take 1 tablet by mouth 2 (two) times daily. (Patient taking differently: Take 1 tablet by mouth at bedtime.) 60 tablet 3   traMADol (ULTRAM) 50 MG tablet Take 1-2 tablets (50-100 mg total) by mouth every 6 (six) hours as needed for moderate pain. 20 tablet 0   Wheat Dextrin (BENEFIBER PO) Take by mouth daily.     No current facility-administered medications for this visit.     Allergies:  Allergies  Allergen Reactions   Codeine Nausea And Vomiting   Sulfa Antibiotics Nausea And Vomiting   Vicodin [Hydrocodone-Acetaminophen] Nausea And Vomiting      Physical Exam:     Blood pressure 137/69, pulse 71, temperature (!) 96.8 F (36 C), temperature source Oral, resp. rate 17, height 5' 3"  (1.6 m), weight 117 lb 1.6 oz (53.1 kg), SpO2 100 %.     ECOG: 0    General appearance: Comfortable appearing without any discomfort Head: Normocephalic  without any trauma Oropharynx: Mucous membranes are moist and pink without any thrush or ulcers. Eyes: Pupils are equal and round reactive to light. Lymph nodes: No cervical, supraclavicular, inguinal or axillary lymphadenopathy.   Heart:regular rate and rhythm.  S1 and S2 without leg edema. Lung: Clear without any rhonchi or wheezes.  No dullness to percussion. Abdomin: Soft, nontender, nondistended with good bowel sounds.  No hepatosplenomegaly. Musculoskeletal: No joint deformity or effusion.  Full range of  motion noted. Neurological: No deficits noted on motor, sensory and deep tendon reflex exam. Skin: No petechial rash or dryness.  Appeared moist.            Lab Results: Lab Results  Component Value Date   WBC 6.9 07/16/2021   HGB 11.6 (L) 07/16/2021   HCT 33.4 (L) 07/16/2021   MCV 93.8 07/16/2021   PLT 213 07/16/2021     Chemistry      Component Value Date/Time   NA 138 07/16/2021 0813   K 4.0 07/16/2021 0813   CL 106 07/16/2021 0813   CO2 23 07/16/2021 0813   BUN 14 07/16/2021 0813   CREATININE 0.80 07/16/2021 0813      Component Value Date/Time   CALCIUM 8.9 07/16/2021 0813   ALKPHOS 74 07/16/2021 0813   AST 26 07/16/2021 0813   ALT 18 07/16/2021 0813   BILITOT 0.9 07/16/2021 0813        Impression and Plan:  70 year old woman with:   1.  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    Her disease status was updated at this time and treatment choices were reviewed.  She continues to receive and will adjuvant therapy without any issues.  Given her high risk disease I recommend continuing this to complete a year.  Frequent staging scans would be required given her high risk of relapse.  Plan to update her staging scans in January 2023.  She is agreeable to proceed.  She is having shoulder surgery scheduled for November 2022 and requested her treatment to be postponed till December which we will do that.   2.  IV access: Port-A-Cath continues to be in use without any issues.   3.  Antiemetics: No nausea or vomiting reported at this time.  Compazine is available to her.   4.  Immune mediated complications: Complications including pneumonitis, colitis, thyroid disease among others were reiterated.   5.  Goals of care: Aggressive measures are warranted at this time given her curable disease.   6.  Follow-up: In 8 weeks for repeat follow-up.   30  minutes were dedicated to this encounter.  The time was spent on reviewing laboratory  data, disease status update and outlining future plan of care.  [name], MD 10/13/20228:11 AM

## 2021-08-13 NOTE — Progress Notes (Signed)
Okay to proceed with treatment without CMP results per Dr. Alen Blew

## 2021-08-24 NOTE — Progress Notes (Addendum)
Anesthesia Review:  PCP: Dr Kaleen Mask  Cardiologist : none  Onc:  DR Alen Blew- [place] 08/13/21  Chest x-ray : EKG :03/03/21  CT Chest- 07/12/21  Echo : Stress test: Cardiac Cath :  Activity level: can do a flight of stairs without difficulty  Sleep Study/ CPAP : none  Fasting Blood Sugar :      / Checks Blood Sugar -- times a day:   Blood Thinner/ Instructions /Last Dose: ASA / Instructions/ Last Dose :   08/13/21- CBC and CMP  PORT and OSTOMY BAG on right for urine PT to bring supplies to hospital  No covid test- ambulatory surgery.

## 2021-08-24 NOTE — Progress Notes (Signed)
DUE TO COVID-19 ONLY ONE VISITOR IS ALLOWED TO COME WITH YOU AND STAY IN THE WAITING ROOM ONLY DURING PRE OP AND PROCEDURE DAY OF SURGERY.  2 VISITOR  MAY VISIT WITH YOU AFTER SURGERY IN YOUR PRIVATE ROOM DURING VISITING HOURS ONLY!  YOU NEED TO HAVE A COVID 19 TEST ON______AME@_  @_from  8am-3pm _____, THIS TEST MUST BE DONE BEFORE SURGERY,  Covid test is done at [place], [place] [address].  This is a drive thru.  No appt required. Please see map.                 Your procedure is scheduled on:  09/03/2021   Report to [facility] Main  Entrance   Report to admitting at    [phone]     Call this number if you have problems the morning of surgery [phone]    REMEMBER: NO  SOLID FOOD CANDY OR GUM AFTER MIDNIGHT. CLEAR LIQUIDS UNTIL  0700am        . NOTHING BY MOUTH EXCEPT CLEAR LIQUIDS UNTIL 0700am    . PLEASE FINISH ENSURE DRINK PER SURGEON ORDER  WHICH NEEDS TO BE COMPLETED AT   0700am    .      CLEAR LIQUID DIET   Foods Allowed                                                                    Coffee and tea, regular and decaf                            Fruit ices (not with fruit pulp)                                      Iced Popsicles                                    Carbonated beverages, regular and diet                                    Cranberry, grape and apple juices Sports drinks like Gatorade Lightly seasoned clear broth or consume(fat free) Sugar, honey syrup ___________________________________________________________________      BRUSH YOUR TEETH MORNING OF SURGERY AND RINSE YOUR MOUTH OUT, NO CHEWING GUM CANDY OR MINTS.     Take these medicines the morning of surgery with A SIP OF WATER: prilosec   DO NOT TAKE ANY DIABETIC MEDICATIONS DAY OF YOUR SURGERY                               You may not have any metal on your body including hair pins and              piercings  Do not wear jewelry, make-up, lotions, powders or perfumes,  deodorant             Do not wear nail polish on  your fingernails.  Do not shave  48 hours prior to surgery.              Men may shave face and neck.   Do not bring valuables to the hospital. [place].  Contacts, dentures or bridgework may not be worn into surgery.  Leave suitcase in the car. After surgery it may be brought to your room.     Patients discharged the day of surgery will not be allowed to drive home. IF YOU ARE HAVING SURGERY AND GOING HOME THE SAME DAY, YOU MUST HAVE AN ADULT TO DRIVE YOU HOME AND BE WITH YOU FOR 24 HOURS. YOU MAY GO HOME BY TAXI OR UBER OR ORTHERWISE, BUT AN ADULT MUST ACCOMPANY YOU HOME AND STAY WITH YOU FOR 24 HOURS.  Name and phone number of your driver:  Special Instructions: N/A              Please read over the following fact sheets you were given: _____________________________________________________________________  [facility] - Preparing for Surgery Before surgery, you can play an important role.  Because skin is not sterile, your skin needs to be as free of germs as possible.  You can reduce the number of germs on your skin by washing with CHG (chlorahexidine gluconate) soap before surgery.  CHG is an antiseptic cleaner which kills germs and bonds with the skin to continue killing germs even after washing. Please DO NOT use if you have an allergy to CHG or antibacterial soaps.  If your skin becomes reddened/irritated stop using the CHG and inform your nurse when you arrive at Short Stay. Do not shave (including legs and underarms) for at least 48 hours prior to the first CHG shower.  You may shave your face/neck. Please follow these instructions carefully:  1.  Shower with CHG Soap the night before surgery and the  morning of Surgery.  2.  If you choose to wash your hair, wash your hair first as usual with your  normal  shampoo.  3.  After you shampoo, rinse your hair and body thoroughly to remove  the  shampoo.                           4.  Use CHG as you would any other liquid soap.  You can apply chg directly  to the skin and wash                       Gently with a scrungie or clean washcloth.  5.  Apply the CHG Soap to your body ONLY FROM THE NECK DOWN.   Do not use on face/ open                           Wound or open sores. Avoid contact with eyes, ears mouth and genitals (private parts).                       Wash face,  Genitals (private parts) with your normal soap.             6.  Wash thoroughly, paying special attention to the area where your surgery  will be performed.  7.  Thoroughly rinse your body with warm water from the neck down.  8.  DO NOT shower/wash with your normal soap after using and rinsing off  the CHG Soap.                9.  Pat yourself dry with a clean towel.            10.  Wear clean pajamas.            11.  Place clean sheets on your bed the night of your first shower and do not  sleep with pets. Day of Surgery : Do not apply any lotions/deodorants the morning of surgery.  Please wear clean clothes to the hospital/surgery center.  FAILURE TO FOLLOW THESE INSTRUCTIONS MAY RESULT IN THE CANCELLATION OF YOUR SURGERY PATIENT SIGNATURE_________________________________  NURSE SIGNATURE__________________________________  ________________________________________________________________________              [facility]- Preparing for Total Shoulder Arthroplasty    Before surgery, you can play an important role. Because skin is not sterile, your skin needs to be as free of germs as possible. You can reduce the number of germs on your skin by using the following products. Benzoyl Peroxide Gel Reduces the number of germs present on the skin Applied twice a day to shoulder area starting two days before surgery    ==================================================================  Please follow these instructions carefully:  BENZOYL PEROXIDE 5%  GEL  Please do not use if you have an allergy to benzoyl peroxide.   If your skin becomes reddened/irritated stop using the benzoyl peroxide.  Starting two days before surgery, apply as follows: Apply benzoyl peroxide in the morning and at night. Apply after taking a shower. If you are not taking a shower clean entire shoulder front, back, and side along with the armpit with a clean wet washcloth.  Place a quarter-sized dollop on your shoulder and rub in thoroughly, making sure to cover the front, back, and side of your shoulder, along with the armpit.   2 days before ____ AM   ____ PM              1 day before ____ AM   ____ PM                         Do this twice a day for two days.  (Last application is the night before surgery, AFTER using the CHG soap as described below).  Do NOT apply benzoyl peroxide gel on the day of surgery.

## 2021-08-26 ENCOUNTER — Encounter (HOSPITAL_COMMUNITY)
Admission: RE | Admit: 2021-08-26 | Discharge: 2021-08-26 | Disposition: A | Discharge status: Home / Self Care | Payer: Medicare Other | Source: Ambulatory Visit | Attending: Orthopedic Surgery | Admitting: Orthopedic Surgery

## 2021-08-26 ENCOUNTER — Encounter (HOSPITAL_COMMUNITY): Payer: Self-pay

## 2021-08-26 ENCOUNTER — Other Ambulatory Visit: Payer: Self-pay

## 2021-08-26 VITALS — BP 114/68 | HR 66 | Temp 98.2°F | Resp 16 | Ht 63.5 in | Wt 115.0 lb

## 2021-08-26 DIAGNOSIS — Z01818 Encounter for other preprocedural examination: Secondary | ICD-10-CM

## 2021-08-26 DIAGNOSIS — M19012 Primary osteoarthritis, left shoulder: Secondary | ICD-10-CM | POA: Insufficient documentation

## 2021-08-26 DIAGNOSIS — M069 Rheumatoid arthritis, unspecified: Secondary | ICD-10-CM | POA: Diagnosis not present

## 2021-08-26 DIAGNOSIS — Z79899 Other long term (current) drug therapy: Secondary | ICD-10-CM | POA: Diagnosis not present

## 2021-08-26 DIAGNOSIS — Z01812 Encounter for preprocedural laboratory examination: Secondary | ICD-10-CM | POA: Insufficient documentation

## 2021-08-26 DIAGNOSIS — K219 Gastro-esophageal reflux disease without esophagitis: Secondary | ICD-10-CM | POA: Diagnosis not present

## 2021-08-26 DIAGNOSIS — Z87891 Personal history of nicotine dependence: Secondary | ICD-10-CM | POA: Insufficient documentation

## 2021-08-26 LAB — SURGICAL PCR SCREEN
MRSA, PCR: NEGATIVE
Staphylococcus aureus: POSITIVE — AB

## 2021-08-26 NOTE — Progress Notes (Signed)
Anesthesia Chart Review   Case: [id number] Date/Time: 09/03/21 0945   Procedure: REVERSE SHOULDER ARTHROPLASTY (Left: Shoulder)   Anesthesia type: General   Pre-op diagnosis: Left shoulder advanced osteoarthritis/rheumatoid arthritis   Location: [facility]OM 06 / [facility]   Surgeons: Justice Britain, MD       DISCUSSION:70 y.o. former smoker with h/o PONV, GERD, RA, urothelial cancer, left shoulder OA scheduled for above procedure 09/03/2021 with Dr. Justice Britain.   Anticipate pt can proceed with planned procedure barring acute status change.   VS: BP 114/68   Pulse 66   Temp 36.8 C (Oral)   Resp 16   Ht 5' 3.5" (1.613 m)   Wt 52.2 kg   SpO2 100%   BMI 20.05 kg/m   PROVIDERS: [name], MD is PCP    LABS: Labs reviewed: Acceptable for surgery. 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   Wears glasses     Past Surgical History:  Procedure Laterality Date   COLONOSCOPY     CYSTOSCOPY WITH INJECTION N/A 03/04/2021   Procedure: CYSTOSCOPY WITH INJECTION OF INDOCYANINE GREEN DYE;  Surgeon: Alexis Frock, MD;  [facility];  Service: Urology;  Laterality: N/A;   CYSTOSCOPY WITH URETHRAL DILATATION N/A 09/23/2020   Procedure: CYSTOSCOPY WITH URETHRAL DILATATION, EXAM UNDER ANESTHESIA, BIOPSY OF URETHRAL MASS;  Surgeon: Robley Fries, MD;  [facility];  Service: Urology;   Laterality: N/A;   EXCISION ORAL TUMOR Left 03/27/2014   Procedure: EXCISION OF ORAL CANCER;  Surgeon: Izora Gala, MD;  Location: [place];  Service: ENT;  Laterality: Left;   FINGER GANGLION CYST EXCISION Right 2007   small dip   INCISION AND DRAINAGE ABSCESS Left 06/12/2014   Procedure: LEFT INCISION AND DRAINAGE MANDIBULAR ABSCESS;  Surgeon: Izora Gala, MD;  Location: [place];  Service: ENT;  Laterality: Left;   INCISION AND DRAINAGE ABSCESS Left 11/22/2014   Procedure: INCISION AND DRAINAGE LEFT  MANDIBULAR ABSCESS;  Surgeon: Izora Gala, MD;  Location: [place];  Service: ENT;  Laterality: Left;   INCISION AND DRAINAGE OF PERITONSILLAR ABCESS Left 06/01/2014   Procedure: INCISION AND DRAINAGE Masticator Space Infection;  Surgeon: Izora Gala, MD;  Location: [place];  Service: ENT;  Laterality: Left;   IR IMAGING GUIDED PORT INSERTION  10/21/2020   LYMPH NODE DISSECTION Bilateral 03/04/2021   Procedure: LYMPH NODE DISSECTION;  Surgeon: Alexis Frock, MD;  [facility];  Service: Urology;  Laterality: Bilateral;   MULTIPLE EXTRACTIONS WITH ALVEOLOPLASTY Left 03/27/2014   Procedure: EXTRACTIONS OF NECESSARY TOOTH ;  Surgeon: Ceasar Mons, DDS;  Location: [place];  Service: Oral Surgery;  Laterality: Left;   ROBOT ASSISTED LAPAROSCOPIC COMPLETE CYSTECT ILEAL CONDUIT N/A 03/04/2021   Procedure: XI ROBOTIC ASSISTED LAPAROSCOPIC COMPLETE CYSTECT ILEAL CONDUIT;  Surgeon: Tresa Moore,  Hubbard Robinson, MD;  [facility];  Service: Urology;  Laterality: N/A;  6.5 HRS   ROBOTIC ASSISTED LAPAROSCOPIC HYSTERECTOMY AND SALPINGECTOMY Bilateral 03/04/2021   Procedure: XI ROBOTIC ASSISTED LAPAROSCOPIC HYSTERECTOMY AND SALPINGECTOMY;  Surgeon: Alexis Frock, MD;  [facility];  Service: Urology;  Laterality: Bilateral;   TUBAL LIGATION  ~ 1988    MEDICATIONS:  acetaminophen (TYLENOL) 500 MG tablet   cycloSPORINE (RESTASIS) 0.05 % ophthalmic emulsion   folic acid (FOLVITE) 347 MCG  tablet   lidocaine-prilocaine (EMLA) cream   Melatonin 10 MG TABS   mesalamine (CANASA) 1000 MG suppository   methotrexate (RHEUMATREX) 2.5 MG tablet   Multiple Vitamin (MULTIVITAMIN WITH MINERALS) TABS tablet   omeprazole (PRILOSEC) 40 MG capsule   traMADol (ULTRAM) 50 MG tablet   No current facility-administered medications for this encounter.     Konrad Felix Ward, PA-C [facility] [phone]

## 2021-09-03 ENCOUNTER — Ambulatory Visit (HOSPITAL_COMMUNITY): Payer: Medicare Other | Admitting: Physician Assistant

## 2021-09-03 ENCOUNTER — Encounter (HOSPITAL_COMMUNITY): Payer: Self-pay | Admitting: Orthopedic Surgery

## 2021-09-03 ENCOUNTER — Encounter (HOSPITAL_COMMUNITY)
Admission: RE | Disposition: A | Discharge status: Home / Self Care | Payer: Self-pay | Source: Ambulatory Visit | Attending: Orthopedic Surgery

## 2021-09-03 ENCOUNTER — Ambulatory Visit (HOSPITAL_COMMUNITY)
Admission: RE | Admit: 2021-09-03 | Discharge: 2021-09-03 | Disposition: A | Discharge status: Home / Self Care | Payer: Medicare Other | Source: Ambulatory Visit | Attending: Orthopedic Surgery | Admitting: Orthopedic Surgery

## 2021-09-03 ENCOUNTER — Ambulatory Visit (HOSPITAL_COMMUNITY): Payer: Medicare Other | Admitting: Anesthesiology

## 2021-09-03 DIAGNOSIS — M19011 Primary osteoarthritis, right shoulder: Secondary | ICD-10-CM | POA: Diagnosis not present

## 2021-09-03 DIAGNOSIS — M069 Rheumatoid arthritis, unspecified: Secondary | ICD-10-CM | POA: Diagnosis not present

## 2021-09-03 DIAGNOSIS — K219 Gastro-esophageal reflux disease without esophagitis: Secondary | ICD-10-CM | POA: Insufficient documentation

## 2021-09-03 DIAGNOSIS — Z79899 Other long term (current) drug therapy: Secondary | ICD-10-CM | POA: Insufficient documentation

## 2021-09-03 DIAGNOSIS — M8580 Other specified disorders of bone density and structure, unspecified site: Secondary | ICD-10-CM | POA: Diagnosis not present

## 2021-09-03 DIAGNOSIS — M19012 Primary osteoarthritis, left shoulder: Secondary | ICD-10-CM | POA: Diagnosis present

## 2021-09-03 DIAGNOSIS — Z87891 Personal history of nicotine dependence: Secondary | ICD-10-CM | POA: Insufficient documentation

## 2021-09-03 HISTORY — PX: REVERSE SHOULDER ARTHROPLASTY: SHX5054

## 2021-09-03 SURGERY — ARTHROPLASTY, SHOULDER, TOTAL, REVERSE
Anesthesia: General | Site: Shoulder | Laterality: Left

## 2021-09-03 MED ORDER — MEPERIDINE HCL 50 MG/ML IJ SOLN: 6.2500 mg | INTRAMUSCULAR | Status: DC | PRN

## 2021-09-03 MED ORDER — LIDOCAINE HCL (PF) 2 % IJ SOLN
INTRAMUSCULAR | Status: AC
  Filled 2021-09-03: qty 5

## 2021-09-03 MED ORDER — LACTATED RINGERS IV BOLUS
500.0000 mL | Freq: Once | INTRAVENOUS | Status: AC
  Administered 2021-09-03: 500 mL via INTRAVENOUS

## 2021-09-03 MED ORDER — LACTATED RINGERS IV BOLUS
250.0000 mL | Freq: Once | INTRAVENOUS | Status: AC
  Administered 2021-09-03: 250 mL via INTRAVENOUS

## 2021-09-03 MED ORDER — VANCOMYCIN HCL 1000 MG IV SOLR
INTRAVENOUS | Status: AC
  Filled 2021-09-03: qty 20

## 2021-09-03 MED ORDER — PHENYLEPHRINE HCL-NACL 20-0.9 MG/250ML-% IV SOLN
INTRAVENOUS | Status: DC | PRN
  Administered 2021-09-03: 45 ug/min via INTRAVENOUS

## 2021-09-03 MED ORDER — MIDAZOLAM HCL 2 MG/2ML IJ SOLN
1.0000 mg | INTRAMUSCULAR | Status: DC
Start: 2021-09-03 — End: 2021-09-03
  Administered 2021-09-03: 1 mg via INTRAVENOUS
  Filled 2021-09-03: qty 2

## 2021-09-03 MED ORDER — DEXAMETHASONE SODIUM PHOSPHATE 10 MG/ML IJ SOLN
INTRAMUSCULAR | Status: AC
  Filled 2021-09-03: qty 1

## 2021-09-03 MED ORDER — BUPIVACAINE LIPOSOME 1.3 % IJ SUSP
INTRAMUSCULAR | Status: DC | PRN
  Administered 2021-09-03: 10 mL via PERINEURAL

## 2021-09-03 MED ORDER — TRAMADOL HCL 50 MG PO TABS: 50.0000 mg | ORAL_TABLET | Freq: Four times a day (QID) | ORAL | 0 refills | Status: DC | PRN

## 2021-09-03 MED ORDER — CEFAZOLIN SODIUM-DEXTROSE 2-4 GM/100ML-% IV SOLN
2.0000 g | INTRAVENOUS | Status: AC
  Administered 2021-09-03: 2 g via INTRAVENOUS
  Filled 2021-09-03: qty 100

## 2021-09-03 MED ORDER — ONDANSETRON HCL 4 MG/2ML IJ SOLN
INTRAMUSCULAR | Status: DC | PRN
  Administered 2021-09-03: 4 mg via INTRAVENOUS

## 2021-09-03 MED ORDER — METOCLOPRAMIDE HCL 5 MG/ML IJ SOLN: 5.0000 mg | Freq: Three times a day (TID) | INTRAMUSCULAR | Status: DC | PRN

## 2021-09-03 MED ORDER — PHENYLEPHRINE HCL (PRESSORS) 10 MG/ML IV SOLN
INTRAVENOUS | Status: AC
  Filled 2021-09-03: qty 2

## 2021-09-03 MED ORDER — SUGAMMADEX SODIUM 200 MG/2ML IV SOLN
INTRAVENOUS | Status: DC | PRN
  Administered 2021-09-03: 100 mg via INTRAVENOUS

## 2021-09-03 MED ORDER — 0.9 % SODIUM CHLORIDE (POUR BTL) OPTIME
TOPICAL | Status: DC | PRN
  Administered 2021-09-03: 1000 mL

## 2021-09-03 MED ORDER — SUCCINYLCHOLINE CHLORIDE 200 MG/10ML IV SOSY
PREFILLED_SYRINGE | INTRAVENOUS | Status: DC | PRN
  Administered 2021-09-03: 100 mg via INTRAVENOUS

## 2021-09-03 MED ORDER — ONDANSETRON HCL 4 MG PO TABS
4.0000 mg | ORAL_TABLET | Freq: Four times a day (QID) | ORAL | Status: DC | PRN
  Filled 2021-09-03: qty 1

## 2021-09-03 MED ORDER — PROPOFOL 10 MG/ML IV BOLUS
INTRAVENOUS | Status: AC
  Filled 2021-09-03: qty 20

## 2021-09-03 MED ORDER — FENTANYL CITRATE PF 50 MCG/ML IJ SOSY: 25.0000 ug | PREFILLED_SYRINGE | INTRAMUSCULAR | Status: DC | PRN

## 2021-09-03 MED ORDER — DROPERIDOL 2.5 MG/ML IJ SOLN: 0.6250 mg | Freq: Once | INTRAMUSCULAR | Status: DC | PRN

## 2021-09-03 MED ORDER — ONDANSETRON HCL 4 MG/2ML IJ SOLN: 4.0000 mg | Freq: Four times a day (QID) | INTRAMUSCULAR | Status: DC | PRN

## 2021-09-03 MED ORDER — METOCLOPRAMIDE HCL 5 MG PO TABS
5.0000 mg | ORAL_TABLET | Freq: Three times a day (TID) | ORAL | Status: DC | PRN
  Filled 2021-09-03: qty 2

## 2021-09-03 MED ORDER — OXYCODONE-ACETAMINOPHEN 5-325 MG PO TABS
1.0000 | ORAL_TABLET | ORAL | 0 refills | Status: DC | PRN
Start: 2021-09-03 — End: 2021-12-31

## 2021-09-03 MED ORDER — LIDOCAINE HCL (CARDIAC) PF 100 MG/5ML IV SOSY
PREFILLED_SYRINGE | INTRAVENOUS | Status: DC | PRN
  Administered 2021-09-03: 60 mg via INTRAVENOUS

## 2021-09-03 MED ORDER — VANCOMYCIN HCL 1000 MG IV SOLR
INTRAVENOUS | Status: DC | PRN
  Administered 2021-09-03: 1000 mg

## 2021-09-03 MED ORDER — ROCURONIUM BROMIDE 10 MG/ML (PF) SYRINGE
PREFILLED_SYRINGE | INTRAVENOUS | Status: DC | PRN
  Administered 2021-09-03: 30 mg via INTRAVENOUS

## 2021-09-03 MED ORDER — DEXAMETHASONE SODIUM PHOSPHATE 4 MG/ML IJ SOLN
INTRAMUSCULAR | Status: DC | PRN
  Administered 2021-09-03: 6 mg via INTRAVENOUS

## 2021-09-03 MED ORDER — TRANEXAMIC ACID 1000 MG/10ML IV SOLN: 1000.0000 mg | INTRAVENOUS | Status: DC

## 2021-09-03 MED ORDER — LACTATED RINGERS IV SOLN: INTRAVENOUS | Status: DC

## 2021-09-03 MED ORDER — PHENYLEPHRINE 40 MCG/ML (10ML) SYRINGE FOR IV PUSH (FOR BLOOD PRESSURE SUPPORT)
PREFILLED_SYRINGE | INTRAVENOUS | Status: DC | PRN
  Administered 2021-09-03 (×3): 80 ug via INTRAVENOUS

## 2021-09-03 MED ORDER — FENTANYL CITRATE PF 50 MCG/ML IJ SOSY
50.0000 ug | PREFILLED_SYRINGE | INTRAMUSCULAR | Status: DC
Start: 2021-09-03 — End: 2021-09-03
  Administered 2021-09-03: 50 ug via INTRAVENOUS
  Filled 2021-09-03: qty 2

## 2021-09-03 MED ORDER — ROCURONIUM BROMIDE 10 MG/ML (PF) SYRINGE
PREFILLED_SYRINGE | INTRAVENOUS | Status: AC
  Filled 2021-09-03: qty 10

## 2021-09-03 MED ORDER — NAPROXEN 500 MG PO TABS: 500.0000 mg | ORAL_TABLET | Freq: Two times a day (BID) | ORAL | 1 refills | Status: DC

## 2021-09-03 MED ORDER — PROPOFOL 10 MG/ML IV BOLUS
INTRAVENOUS | Status: DC | PRN
  Administered 2021-09-03: 150 mg via INTRAVENOUS

## 2021-09-03 MED ORDER — STERILE WATER FOR IRRIGATION IR SOLN
Status: DC | PRN
  Administered 2021-09-03: 1000 mL

## 2021-09-03 MED ORDER — ONDANSETRON HCL 4 MG/2ML IJ SOLN
INTRAMUSCULAR | Status: AC
  Filled 2021-09-03: qty 2

## 2021-09-03 MED ORDER — ONDANSETRON HCL 4 MG PO TABS: 4.0000 mg | ORAL_TABLET | Freq: Three times a day (TID) | ORAL | 0 refills | Status: DC | PRN

## 2021-09-03 MED ORDER — BUPIVACAINE HCL (PF) 0.5 % IJ SOLN
INTRAMUSCULAR | Status: DC | PRN
  Administered 2021-09-03: 10 mL via PERINEURAL

## 2021-09-03 MED ORDER — TRANEXAMIC ACID-NACL 1000-0.7 MG/100ML-% IV SOLN
1000.0000 mg | INTRAVENOUS | Status: AC
  Administered 2021-09-03: 1000 mg via INTRAVENOUS
  Filled 2021-09-03: qty 100

## 2021-09-03 MED ORDER — CYCLOBENZAPRINE HCL 10 MG PO TABS: 10.0000 mg | ORAL_TABLET | Freq: Three times a day (TID) | ORAL | 1 refills | Status: DC | PRN

## 2021-09-03 SURGICAL SUPPLY — 66 items
BAG COUNTER SPONGE SURGICOUNT (BAG) IMPLANT
BAG ZIPLOCK 12X15 (MISCELLANEOUS) ×2 IMPLANT
BLADE SAW SGTL 83.5X18.5 (BLADE) ×2 IMPLANT
BNDG COHESIVE 4X5 TAN ST LF (GAUZE/BANDAGES/DRESSINGS) ×2 IMPLANT
COOLER ICEMAN CLASSIC (MISCELLANEOUS) ×2 IMPLANT
COVER BACK TABLE 60X90IN (DRAPES) ×2 IMPLANT
COVER SURGICAL LIGHT HANDLE (MISCELLANEOUS) ×2 IMPLANT
CUP SUT UNIV REVERS 36 NEUTRAL (Cup) ×2 IMPLANT
DERMABOND ADVANCED (GAUZE/BANDAGES/DRESSINGS) ×1
DERMABOND ADVANCED .7 DNX12 (GAUZE/BANDAGES/DRESSINGS) ×1 IMPLANT
DRAPE INCISE IOBAN 66X45 STRL (DRAPES) ×2 IMPLANT
DRAPE ORTHO SPLIT 77X108 STRL (DRAPES) ×4
DRAPE SHEET LG 3/4 BI-LAMINATE (DRAPES) ×2 IMPLANT
DRAPE SURG 17X11 SM STRL (DRAPES) IMPLANT
DRAPE SURG ORHT 6 SPLT 77X108 (DRAPES) ×2 IMPLANT
DRAPE TOP 10253 STERILE (DRAPES) ×2 IMPLANT
DRAPE U-SHAPE 47X51 STRL (DRAPES) ×2 IMPLANT
DRESSING AQUACEL AG SP 3.5X6 (GAUZE/BANDAGES/DRESSINGS) ×1 IMPLANT
DRSG AQUACEL AG ADV 3.5X 6 (GAUZE/BANDAGES/DRESSINGS) ×2 IMPLANT
DRSG AQUACEL AG ADV 3.5X10 (GAUZE/BANDAGES/DRESSINGS) IMPLANT
DRSG AQUACEL AG SP 3.5X6 (GAUZE/BANDAGES/DRESSINGS) ×2
DURAPREP 26ML APPLICATOR (WOUND CARE) ×2 IMPLANT
ELECT BLADE TIP CTD 4 INCH (ELECTRODE) ×2 IMPLANT
ELECT REM PT RETURN 15FT ADLT (MISCELLANEOUS) ×2 IMPLANT
FACESHIELD WRAPAROUND (MASK) ×8 IMPLANT
GLENOID UNI REV MOD 24 +2 LAT (Joint) ×2 IMPLANT
GLENOSPHERE 36 +4 LAT/24 (Joint) ×2 IMPLANT
GLOVE SRG 8 PF TXTR STRL LF DI (GLOVE) ×1 IMPLANT
GLOVE SURG ENC MOIS LTX SZ7 (GLOVE) ×2 IMPLANT
GLOVE SURG ENC MOIS LTX SZ7.5 (GLOVE) ×2 IMPLANT
GLOVE SURG UNDER POLY LF SZ7 (GLOVE) ×2 IMPLANT
GLOVE SURG UNDER POLY LF SZ8 (GLOVE) ×2
GOWN STRL REUS W/TWL LRG LVL3 (GOWN DISPOSABLE) ×4 IMPLANT
KIT BASIN OR (CUSTOM PROCEDURE TRAY) ×2 IMPLANT
KIT TURNOVER KIT A (KITS) IMPLANT
LINER HUMERAL 36 +3MM SM (Shoulder) ×2 IMPLANT
MANIFOLD NEPTUNE II (INSTRUMENTS) ×2 IMPLANT
NEEDLE TAPERED W/ NITINOL LOOP (MISCELLANEOUS) ×2 IMPLANT
NS IRRIG 1000ML POUR BTL (IV SOLUTION) ×2 IMPLANT
PACK SHOULDER (CUSTOM PROCEDURE TRAY) ×2 IMPLANT
PAD ARMBOARD 7.5X6 YLW CONV (MISCELLANEOUS) ×2 IMPLANT
PAD COLD SHLDR WRAP-ON (PAD) ×2 IMPLANT
PIN NITINOL TARGETER 2.8 (PIN) IMPLANT
PIN SET MODULAR GLENOID SYSTEM (PIN) IMPLANT
RESTRAINT HEAD UNIVERSAL NS (MISCELLANEOUS) ×2 IMPLANT
SCREW CENTRAL MODULAR 25 (Screw) ×2 IMPLANT
SCREW PERI LOCK 5.5X16 (Screw) ×2 IMPLANT
SCREW PERI LOCK 5.5X36 (Screw) ×2 IMPLANT
SCREW PERIPHERAL 5.5X20 LOCK (Screw) ×4 IMPLANT
SLING ARM FOAM STRAP LRG (SOFTGOODS) IMPLANT
SLING ARM FOAM STRAP MED (SOFTGOODS) ×2 IMPLANT
SPONGE T-LAP 18X18 ~~LOC~~+RFID (SPONGE) IMPLANT
STEM HUMERAL UNI REVERS SZ9 (Stem) ×2 IMPLANT
SUCTION FRAZIER HANDLE 12FR (TUBING) ×2
SUCTION TUBE FRAZIER 12FR DISP (TUBING) ×1 IMPLANT
SUT FIBERWIRE #2 38 T-5 BLUE (SUTURE)
SUT MNCRL AB 3-0 PS2 18 (SUTURE) ×2 IMPLANT
SUT MON AB 2-0 CT1 36 (SUTURE) ×2 IMPLANT
SUT VIC AB 1 CT1 36 (SUTURE) ×2 IMPLANT
SUTURE FIBERWR #2 38 T-5 BLUE (SUTURE) IMPLANT
SUTURE TAPE 1.3 40 TPR END (SUTURE) ×2 IMPLANT
SUTURETAPE 1.3 40 TPR END (SUTURE) ×4
TOWEL OR 17X26 10 PK STRL BLUE (TOWEL DISPOSABLE) ×2 IMPLANT
TOWEL OR NON WOVEN STRL DISP B (DISPOSABLE) ×2 IMPLANT
WATER STERILE IRR 1000ML POUR (IV SOLUTION) ×4 IMPLANT
YANKAUER SUCT BULB TIP 10FT TU (MISCELLANEOUS) ×2 IMPLANT

## 2021-09-03 NOTE — H&P (Signed)
Sandra Mann    Chief Complaint: Left shoulder advanced osteoarthritis/rheumatoid arthritis HPI: The patient is a 70 y.o. female with chronic and progressively increasing bilateral shoulder pain, left much more problematic than the right, with underlying osteoarthritis and rheumatoid arthritis.  Due to her increasing functional imitations and failure to respond to prolonged attempts at conservative management, she is brought to the operating room at this time for planned left shoulder reverse arthroplasty.  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   Wears glasses     Past Surgical History:  Procedure Laterality Date   COLONOSCOPY     CYSTOSCOPY WITH INJECTION N/A 03/04/2021   Procedure: CYSTOSCOPY WITH INJECTION OF INDOCYANINE GREEN DYE;  Surgeon: Alexis Frock, MD;  [facility];  Service: Urology;  Laterality: N/A;   CYSTOSCOPY WITH URETHRAL DILATATION N/A 09/23/2020   Procedure: CYSTOSCOPY WITH URETHRAL DILATATION, EXAM UNDER ANESTHESIA, BIOPSY OF URETHRAL MASS;  Surgeon: Robley Fries, MD;  [facility];  Service: Urology;  Laterality: N/A;   EXCISION ORAL TUMOR Left 03/27/2014   Procedure: EXCISION OF ORAL CANCER;  Surgeon: Izora Gala, MD;  Location: [place];  Service: ENT;  Laterality: Left;   FINGER GANGLION CYST EXCISION Right 2007   small dip   INCISION AND DRAINAGE ABSCESS Left 06/12/2014   Procedure: LEFT INCISION AND DRAINAGE MANDIBULAR ABSCESS;  Surgeon: Izora Gala, MD;  Location: [place];  Service: ENT;  Laterality: Left;   INCISION AND DRAINAGE ABSCESS Left 11/22/2014   Procedure: INCISION AND DRAINAGE LEFT  MANDIBULAR ABSCESS;   Surgeon: Izora Gala, MD;  Location: [place];  Service: ENT;  Laterality: Left;   INCISION AND DRAINAGE OF PERITONSILLAR ABCESS Left 06/01/2014   Procedure: INCISION AND DRAINAGE Masticator Space Infection;  Surgeon: Izora Gala, MD;  Location: [place];  Service: ENT;  Laterality: Left;   IR IMAGING GUIDED PORT INSERTION  10/21/2020   LYMPH NODE DISSECTION Bilateral 03/04/2021   Procedure: LYMPH NODE DISSECTION;  Surgeon: Alexis Frock, MD;  [facility];  Service: Urology;  Laterality: Bilateral;   MULTIPLE EXTRACTIONS WITH ALVEOLOPLASTY Left 03/27/2014   Procedure: EXTRACTIONS OF NECESSARY TOOTH ;  Surgeon: Ceasar Mons, DDS;  Location: [place];  Service: Oral Surgery;  Laterality: Left;   ROBOT ASSISTED LAPAROSCOPIC COMPLETE CYSTECT ILEAL CONDUIT N/A 03/04/2021   Procedure: XI ROBOTIC ASSISTED LAPAROSCOPIC COMPLETE CYSTECT ILEAL CONDUIT;  Surgeon: Alexis Frock, MD;  [facility];  Service: Urology;  Laterality: N/A;  6.5 HRS   ROBOTIC ASSISTED LAPAROSCOPIC HYSTERECTOMY AND SALPINGECTOMY Bilateral 03/04/2021   Procedure: XI ROBOTIC ASSISTED LAPAROSCOPIC HYSTERECTOMY AND SALPINGECTOMY;  Surgeon: Alexis Frock, MD;  [facility];  Service: Urology;  Laterality: Bilateral;   TUBAL LIGATION  ~ 1988    Family History  Problem Relation Age of Onset   Heart attack Mother    Diabetes Maternal Grandmother     Social History:  reports that she quit smoking about 21 years ago. Her smoking use included cigarettes. She has a 30.00 pack-year smoking history. She has never used smokeless tobacco. She reports current alcohol use. She reports  that she does not use drugs.   Medications Prior to Admission  Medication Sig Dispense Refill   acetaminophen (TYLENOL) 500 MG tablet Take 500 mg by mouth every 6 (six) hours as needed for moderate pain.     cycloSPORINE (RESTASIS) 0.05 % ophthalmic emulsion Place 1 drop into both eyes 2 (two) times daily.     folic acid (FOLVITE) 035 MCG  tablet Take 1,600 mcg by mouth daily.      lidocaine-prilocaine (EMLA) cream Apply 1 application topically as needed. (Patient taking differently: Apply 1 application topically as needed (for the port).) 30 g 0   Melatonin 10 MG TABS Take 10 mg by mouth at bedtime.     mesalamine (CANASA) 1000 MG suppository Place 1,000 mg rectally every other day. At bedtime     methotrexate (RHEUMATREX) 2.5 MG tablet Take 20 mg by mouth every Tuesday.     Multiple Vitamin (MULTIVITAMIN WITH MINERALS) TABS tablet Take 1 tablet by mouth at bedtime. Silver     omeprazole (PRILOSEC) 40 MG capsule Take 40 mg by mouth daily.     traMADol (ULTRAM) 50 MG tablet Take 1-2 tablets (50-100 mg total) by mouth every 6 (six) hours as needed for moderate pain. (Patient not taking: No sig reported) 20 tablet 0     Physical Exam: Left shoulder demonstrates painful and guarded motion as noted at her recent office visits.  She has globally decreased strength secondary to pain but a negative drop arm.  Plain radiographs confirm severe osteoarthritis with subchondral sclerosis, peripheral osteophyte formation, and loss of joint space.  Vitals  Temp:  [98.8 F (37.1 C)] 98.8 F (37.1 C) (11/03 0750) Pulse Rate:  [66] 66 (11/03 0750) Resp:  [16] 16 (11/03 0750) BP: (124)/(68) 124/68 (11/03 0750) SpO2:  [100 %] 100 % (11/03 0750) Weight:  [52.2 kg] 52.2 kg (11/03 0817)  Assessment/Plan  Impression: Left shoulder advanced osteoarthritis/rheumatoid arthritis  Plan of Action: Procedure(s): REVERSE SHOULDER ARTHROPLASTY  Tyronda Vizcarrondo M Alayja Armas 09/03/2021, 9:27 AM Contact # [phone]

## 2021-09-03 NOTE — Progress Notes (Signed)
Assisted Dr. Germeroth with left, ultrasound guided, interscalene  block. Side rails up, monitors on throughout procedure. See vital signs in flow sheet. Tolerated Procedure well. 

## 2021-09-03 NOTE — Anesthesia Postprocedure Evaluation (Signed)
Anesthesia Post Note  Patient: Sandra Mann  Procedure(s) Performed: REVERSE SHOULDER ARTHROPLASTY (Left: Shoulder)     Patient location during evaluation: PACU Anesthesia Type: General Level of consciousness: sedated and patient cooperative Pain management: pain level controlled Vital Signs Assessment: post-procedure vital signs reviewed and stable Respiratory status: spontaneous breathing Cardiovascular status: stable Anesthetic complications: no   No notable events documented.  Last Vitals:  Vitals:   09/03/21 1308 09/03/21 1316  BP: (!) 116/97 135/84  Pulse: 60   Resp:    Temp: 36.7 C   SpO2: 100%     Last Pain:  Vitals:   09/03/21 1308  TempSrc:   PainSc: 0-No pain                 Nolon Nations

## 2021-09-03 NOTE — Anesthesia Procedure Notes (Signed)
Procedure Name: Intubation Date/Time: 09/03/2021 10:12 AM Performed by: Lieutenant Diego, CRNA Pre-anesthesia Checklist: Patient identified, Emergency Drugs available, Suction available and Patient being monitored Patient Re-evaluated:Patient Re-evaluated prior to induction Oxygen Delivery Method: Circle system utilized Preoxygenation: Pre-oxygenation with 100% oxygen Induction Type: IV induction Ventilation: Mask ventilation without difficulty Laryngoscope Size: Miller and 2 Grade View: Grade I Tube type: Oral Tube size: 7.0 mm Number of attempts: 1 Airway Equipment and Method: Stylet Placement Confirmation: ETT inserted through vocal cords under direct vision, positive ETCO2 and breath sounds checked- equal and bilateral Secured at: 23 cm Tube secured with: Tape Dental Injury: Teeth and Oropharynx as per pre-operative assessment

## 2021-09-03 NOTE — Evaluation (Signed)
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 Other medical hx of GERD, RA, ureteral CA   Clinical Impression   Pt is a 70 year old female, s/p LT reverse shoulder replacement without functional use of Left non-dominant upper extremity secondary to effects of surgery and interscalene block and shoulder precautions. Therapist provided education and instruction to patient and spouse in regards to exercises, precautions, positioning, donning upper extremity clothing and bathing while maintaining shoulder precautions, ice and edema management and donning/doffing sling. Patient and spouse verbalized understanding and demonstrated as needed. Patient needed assistance to donn overhead gown, and spouse reports ability to assist with socks and shoes. Pt/spouse were provided with instruction on compensatory strategies to perform ADLs. Patient limited by decreased ROM in LT shoulder so therefore will need some form of assistance at home. Patient and spouse verbalized and/or demonstrated understanding to all instruction. Patient to follow up with MD for further therapy needs.        Recommendations for follow up therapy are one component of a multi-disciplinary discharge planning process, led by the attending physician.  Recommendations may be updated based on patient status, additional functional criteria and insurance authorization.   Follow Up Recommendations  Follow physician's recommendations for discharge plan and follow up therapies    Assistance Recommended at Discharge Frequent or constant  Supervision/Assistance  Functional Status Assessment  Patient has had a recent decline in their functional status and demonstrates the ability to make significant improvements in function in a reasonable and predictable amount of time.  Equipment Recommendations  Tub/shower seat    Recommendations for Other Services       Precautions / Restrictions Precautions Precautions: Shoulder Type of Shoulder Precautions: If sitting in controlled environment, ok to come out of sling to give neck a break. Please sleep in it to protect until follow up in office.     OK to use operative arm for feeding, hygiene and ADLs.   Ok to instruct Pendulums and lap slides as exercises. Ok to use operative arm within the following parameters for ADL purposes     New ROM (8/18)   Ok for PROM, AAROM, AROM within pain tolerance and within the following ROM   ER 20   ABD 45   FE 60 Shoulder Interventions: Shoulder sling/immobilizer;At all times;Off for dressing/bathing/exercises;For comfort Precaution Booklet Issued: Yes (comment) Required Braces or Orthoses: Sling Restrictions Weight Bearing Restrictions: Yes LUE Weight Bearing: Non weight bearing      Mobility Bed Mobility               General bed mobility comments: in recliner    Transfers Overall transfer level: Needs assistance   Transfers: Sit to/from Stand Sit to Stand: Supervision           General transfer comment: Pt's spouse will be 24/7 and close to pt during standing activities.      Balance Overall balance assessment: Mild deficits observed, not formally tested         Standing balance support: No upper extremity supported Standing balance-Leahy Scale: Fair Standing balance comment: Decreased due to dizzy spell  ADL either performed or assessed with clinical judgement   ADL Overall ADL's : Needs assistance/impaired Eating/Feeding: Set up   Grooming: Adhering to UE precautions;With  caregiver independent assisting;Set up;Sitting;Cueing for UE precautions   Upper Body Bathing: Adhering to UE precautions;Cueing for compensatory techniques;Cueing for UE precautions;Moderate assistance;With caregiver independent assisting   Lower Body Bathing: Min guard;Sitting/lateral leans;Sit to/from stand;With caregiver independent assisting   Upper Body Dressing : Moderate assistance;Cueing for sequencing;Adhering to UE precautions;With caregiver independent assisting;Cueing for UE precautions;Cueing for compensatory techniques Upper Body Dressing Details (indicate cue type and reason): Donned overhead gown with instructions and Moderate assistance. Pt ed on how to doff and how to don/doff button front shirts. Lower Body Dressing: Min guard;Sitting/lateral leans;Sit to/from stand;Cueing for compensatory techniques     Toilet Transfer Details (indicate cue type and reason): Stood from chair with supervision. Toileting- Clothing Manipulation and Hygiene: Minimal assistance;Sitting/lateral lean;Cueing for compensatory techniques       Functional mobility during ADLs: Supervision/safety;Min guard General ADL Comments: When pt stood to adjust her abdominal bladder bag and underwear she endorsed dizziness and sat back in chair quickly. Sling donned and LUe safe.     Vision Baseline Vision/History: 1 Wears glasses Ability to See in Adequate Light: 0 Adequate Vision Assessment?: No apparent visual deficits     Perception Perception Perception: Within Functional Limits   Praxis Praxis Praxis: Intact    Pertinent Vitals/Pain Pain Assessment: No/denies pain (post-op numbess)     Hand Dominance Right   Extremity/Trunk Assessment Upper Extremity Assessment Upper Extremity Assessment: RUE deficits/detail RUE: Unable to fully assess due to immobilization RUE Sensation:  (numb from surgery)   Lower Extremity Assessment Lower Extremity Assessment: Overall WFL for tasks assessed    Cervical / Trunk Assessment Cervical / Trunk Assessment: Normal   Communication Communication Communication: No difficulties   Cognition Arousal/Alertness: Awake/alert Behavior During Therapy: WFL for tasks assessed/performed Overall Cognitive Status: Within Functional Limits for tasks assessed                                       General Comments       Exercises Shoulder Exercises Pendulum Exercise:  (Demonstrated to pt. Pt did not perform due to standing dizziness and LUE numbness.) Elbow Flexion: Right;10 reps;Seated;AROM Elbow Extension: Seated;AROM;Right;10 reps Wrist Flexion: AROM;Seated;Right;10 reps Wrist Extension: AROM;Seated;Right;10 reps Digit Composite Flexion: AROM;Seated;10 reps;Right Composite Extension: AROM;Seated;Right;10 reps Neck Flexion: AROM;10 reps;Seated Neck Extension: AROM;10 reps;Seated Neck Lateral Flexion - Right: AROM;10 reps;Seated Neck Lateral Flexion - Left: AROM;10 reps;Seated Hand Exercises Forearm Supination: AROM;Seated;10 reps;Right Forearm Pronation: AROM;Seated;10 reps;Right   Shoulder Instructions Shoulder Instructions Donning/doffing shirt without moving shoulder: Moderate assistance;Caregiver independent with task;Patient able to independently direct caregiver Method for sponge bathing under operated UE: Moderate assistance;Caregiver independent with task;Patient able to independently direct caregiver Donning/doffing sling/immobilizer: Moderate assistance;Caregiver independent with task;Patient able to independently direct caregiver Correct positioning of sling/immobilizer: Independent;Caregiver independent with task;Patient able to independently direct caregiver Pendulum exercises (written home exercise program): Supervision/safety;Caregiver independent with task;Patient able to independently direct caregiver ROM for elbow, wrist and digits of operated UE: Independent;Caregiver independent with task;Patient able to  independently direct caregiver (Perfomed on RUE toshow understanding. LUE remains numb) Sling wearing schedule (on at all times/off for ADL's): Independent;Caregiver independent with task;Patient able to independently direct caregiver Proper positioning of operated UE when showering: Caregiver independent with task;Patient able to independently direct caregiver;Supervision/safety Dressing change: Maximal assistance;Caregiver  independent with task;Patient able to independently direct caregiver Positioning of UE while sleeping: Independent;Caregiver independent with task;Patient able to independently direct caregiver    Home Living Family/patient expects to be discharged to:: Private residence Living Arrangements: Spouse/significant other Available Help at Discharge: Family;Available 24 hours/day Type of Home: House Home Access: Stairs to enter [employer] of Steps: 1 Entrance Stairs-Rails: None Home Layout: One level     Bathroom Shower/Tub: Teacher, early years/pre: Handicapped height     Home Equipment: Grab bars - tub/shower;Rolling Walker (2 wheels)   Additional Comments: Adjustable bed.      Prior Functioning/Environment Prior Level of Function : Independent/Modified Independent;Driving                        OT Problem List: Decreased knowledge of precautions;Impaired UE functional use      OT Treatment/Interventions:      OT Goals(Current goals can be found in the care plan section) Acute Rehab OT Goals Patient Stated Goal: Go home and rest. OT Goal Formulation: All assessment and education complete, DC therapy ADL Goals Additional ADL Goal #1: Pt will demonstrate UE/LE dressing, donning/doffing of sling, correct positioning of LT UE, and compensatory strategies for LT axilla hygiene, all while correctly following all shoulder post-op precautions/restrictions.  OT Frequency:     Barriers to D/C:            Co-evaluation               AM-PAC OT "6 Clicks" Daily Activity     Outcome Measure Help from another person eating meals?: A Little Help from another person taking care of personal grooming?: A Little Help from another person toileting, which includes using toliet, bedpan, or urinal?: A Little Help from another person bathing (including washing, rinsing, drying)?: A Little Help from another person to put on and taking off regular upper body clothing?: A Lot Help from another person to put on and taking off regular lower body clothing?: A Little 6 Click Score: 17   End of Session Equipment Utilized During Treatment: Other (comment) (sling) Nurse Communication:  (Dizzy spell and blood pressure.)  Activity Tolerance: Patient tolerated treatment well Patient left: in chair;with call bell/phone within reach;with family/visitor present  OT Visit Diagnosis: Muscle weakness (generalized) (M62.81)                Time: 0938-1829 OT Time Calculation (min): 43 min Charges:  OT General Charges $OT Visit: 1 Visit OT Evaluation $OT Eval Low Complexity: 1 Low OT Treatments $Self Care/Home Management : 23-37 mins  Anderson Malta, [place] Office: [phone] 09/03/2021  Julien Girt 09/03/2021, 2:50 PM

## 2021-09-03 NOTE — Discharge Instructions (Signed)
Sandra Mann. Supple, M.D., F.A.A.O.S. Orthopaedic Surgery Specializing in Arthroscopic and Reconstructive Surgery of the Shoulder [phone] [address] [place], [place] [address] - Fax [phone]   POST-OP TOTAL SHOULDER REPLACEMENT INSTRUCTIONS  1. Follow up in the office for your first post-op appointment 10-14 days from the date of your surgery. If you do not already have a scheduled appointment, our office will contact you to schedule.  2. The bandage over your incision is waterproof. You may begin showering with this dressing on. You may leave this dressing on until first follow up appointment within 2 weeks. We prefer you leave this dressing in place until follow up however after 5-7 days if you are having itching or skin irritation and would like to remove it you may do so. Go slow and tug at the borders gently to break the bond the dressing has with the skin. At this point if there is no drainage it is okay to go without a bandage or you may cover it with a light guaze and tape. You can also expect significant bruising around your shoulder that will drift down your arm and into your chest wall. This is very normal and should resolve over several days.   3. Wear your sling/immobilizer at all times except to perform the exercises below or to occasionally let your arm dangle by your side to stretch your elbow. You also need to sleep in your sling immobilizer until instructed otherwise. It is ok to remove your sling if you are sitting in a controlled environment and allow your arm to rest in a position of comfort by your side or on your lap with pillows to give your neck and skin a break from the sling. You may remove it to allow arm to dangle by side to shower. If you are up walking around and when you go to sleep at night you need to wear it.  4. Range of motion to your elbow, wrist, and hand are encouraged 3-5 times daily. Exercise to your hand and fingers helps to reduce  swelling you may experience.   5. Prescriptions for a pain medication and a muscle relaxant are provided for you. It is recommended that if you are experiencing pain that you pain medication alone is not controlling, add the muscle relaxant along with the pain medication which can give additional pain relief. The first 1-2 days is generally the most severe of your pain and then should gradually decrease. As your pain lessens it is recommended that you decrease your use of the pain medications to an "as needed basis'" only and to always comply with the recommended dosages of the pain medications.  6. Pain medications can produce constipation along with their use. If you experience this, the use of an over the counter stool softener or laxative daily is recommended.   7. For additional questions or concerns, please do not hesitate to call the office. If after hours there is an answering service to forward your concerns to the physician on call.  8.Pain control following an exparel block  To help control your post-operative pain you received a nerve block  performed with Exparel which is a long acting anesthetic (numbing agent) which can provide pain relief and sensations of numbness (and relief of pain) in the operative shoulder and arm for up to 3 days. Sometimes it provides mixed relief, meaning you may still have numbness in certain areas of the arm but can still be able to  move  parts of that arm, hand, and fingers. We recommend that your prescribed pain medications  be used as needed. We do not feel it is necessary to "pre medicate" and "stay ahead" of pain.  Taking narcotic pain medications when you are not having any pain can lead to unnecessary and potentially dangerous side effects.    9. Use the ice machine as much as possible in the first 5-7 days from surgery, then you can wean its use to as needed. The ice typically needs to be replaced every 6 hours, instead of ice you can actually freeze  water bottles to put in the cooler and then fill water around them to avoid having to purchase ice. You can have spare water bottles freezing to allow you to rotate them once they have melted. Try to have a thin shirt or light cloth or towel under the ice wrap to protect your skin.   FOR ADDITIONAL INFO ON ICE MACHINE AND INSTRUCTIONS GO TO THE WEBSITE AT  [email or web address]  10.  We recommend that you avoid any dental work or cleaning in the first 3 months following your joint replacement. This is to help minimize the possibility of infection from the bacteria in your mouth that enters your bloodstream during dental work. We also recommend that you take an antibiotic prior to your dental work for the first year after your shoulder replacement to further help reduce that risk. Please simply contact our office for antibiotics to be sent to your pharmacy prior to dental work.  11. Dental Antibiotics:  In most cases prophylactic antibiotics for Dental procdeures after total joint surgery are not necessary.  Exceptions are as follows:  1. History of prior total joint infection  2. Severely immunocompromised (Organ Transplant, cancer chemotherapy, Rheumatoid biologic meds such as [place])  3. Poorly controlled diabetes (A1C &gt; 8.0, blood glucose over 200)  If you have one of these conditions, contact your surgeon for an antibiotic prescription, prior to your dental procedure.   POST-OP EXERCISES  Pendulum Exercises  Perform pendulum exercises while standing and bending at the waist. Support your uninvolved arm on a table or chair and allow your operated arm to hang freely. Make sure to do these exercises passively - not using you shoulder muscles. These exercises can be performed once your nerve block effects have worn off.  Repeat 20 times. Do 3 sessions per day.

## 2021-09-03 NOTE — Anesthesia Preprocedure Evaluation (Signed)
Anesthesia Evaluation    Reviewed: Allergy & Precautions, H&P , Patient's Chart, lab work & pertinent test results, Unable to perform ROS - Chart review only  History of Anesthesia Complications (+) PONV and history of anesthetic complications  Airway Mallampati: II  TM Distance: >3 FB Neck ROM: Full    Dental no notable dental hx. (+) Teeth Intact, Dental Advisory Given   Pulmonary neg pulmonary ROS, former smoker,    Pulmonary exam normal breath sounds clear to auscultation       Cardiovascular Exercise Tolerance: Good negative cardio ROS   Rhythm:Regular Rate:Normal     Neuro/Psych negative neurological ROS  negative psych ROS   GI/Hepatic Neg liver ROS, PUD, GERD  ,  Endo/Other  negative endocrine ROS  Renal/GU negative Renal ROS     Musculoskeletal  (+) Arthritis , Osteoarthritis,    Abdominal   Peds  Hematology negative hematology ROS (+)   Anesthesia Other Findings   Reproductive/Obstetrics negative OB ROS                             Anesthesia Physical  Anesthesia Plan  ASA: 2  Anesthesia Plan: General   Post-op Pain Management: GA combined w/ Regional for post-op pain   Induction: Intravenous  PONV Risk Score and Plan: 4 or greater and Ondansetron, Aprepitant, Dexamethasone, Midazolam, Diphenhydramine and Treatment may vary due to age or medical condition  Airway Management Planned: Oral ETT  Additional Equipment:   Intra-op Plan:   Post-operative Plan: Extubation in OR  Informed Consent: I have reviewed the patients History and Physical, chart, labs and discussed the procedure including the risks, benefits and alternatives for the proposed anesthesia with the patient or authorized representative who has indicated his/her understanding and acceptance.     Dental advisory given  Plan Discussed with: CRNA  Anesthesia Plan Comments:         Anesthesia  Quick Evaluation

## 2021-09-03 NOTE — Transfer of Care (Signed)
Immediate Anesthesia Transfer of Care Note  Patient: Sandra Mann  Procedure(s) Performed: REVERSE SHOULDER ARTHROPLASTY (Left: Shoulder)  Patient Location: PACU  Anesthesia Type:General and Regional  Level of Consciousness: awake  Airway & Oxygen Therapy: Patient Spontanous Breathing and Patient connected to face mask oxygen  Post-op Assessment: Report given to RN and Post -op Vital signs reviewed and stable  Post vital signs: Reviewed and stable  Last Vitals:  Vitals Value Taken Time  BP 137/75 09/03/21 1154  Temp    Pulse 64 09/03/21 1156  Resp 14 09/03/21 1156  SpO2 100 % 09/03/21 1156  Vitals shown include unvalidated device data.  Last Pain:  Vitals:   09/03/21 0817  TempSrc:   PainSc: 0-No pain      Patients Stated Pain Goal: 4 (46/95/07 2257)  Complications: No notable events documented.

## 2021-09-03 NOTE — Op Note (Signed)
09/03/2021  11:38 AM  PATIENT:   Sandra Mann  70 y.o. 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  ASSISTANTS: Jenetta Loges, PA-C  ANESTHESIA:   General endotracheal and interscalene block with Exparel  EBL: 100 cc  SPECIMEN: None  Drains: None   PATIENT DISPOSITION:  PACU - hemodynamically stable.    PLAN OF CARE: Discharge to home after PACU  Brief history:  Sandra Mann is a 70 year old female who has been followed for chronic bilateral shoulder pain left much more problematic than the right with exam and radiographs confirming severe arthritis with marked deformity of the humeral head bony erosions with large peripheral osteophytes.  Due to her increasing functional imitations and failure to respond to prolonged attempts at conservative management, she is brought to the operating this time for planned left shoulder reverse arthroplasty  Preoperatively, I counseled the patient regarding treatment options and risks versus benefits thereof.  Possible surgical complications were all reviewed including potential for bleeding, infection, neurovascular injury, persistent pain, loss of motion, anesthetic complication, failure of the implant, and possible need for additional surgery. They understand and accept and agrees with our planned procedure.   Procedure in detail:  After undergoing routine preop evaluation the patient received prophylactic antibiotics and interscalene block with Exparel was established in the holding area by the anesthesia department.  Patient subsequently placed supine on the operating table and underwent the smooth induction general  endotracheal anesthesia.  Placed in the beachchair position and appropriately padded and protected.  The left shoulder girdle region was sterilely prepped and draped in standard fashion.  Timeout was called.  Deltopectoral approach left shoulder was made in approximately a centimeter incision.  Skin flaps were elevated dissection carried deeply and the deltopectoral interval was developed from proximal to this with the vein taken laterally.  The long head biceps tendon was then tenodesed at the upper border the pectoralis major tendon the proximal segment was unroofed and excised.  The rotator cuff was split along the rotator interval to the base of the coracoid and the subscapularis was then separated from the lesser tuberosity using a peel technique and the free margin was tagged with a pair of suture tape sutures.  Capsular attachments were then divided from the anterior and infra margins of the humeral neck and the humeral head was then delivered through the wound.  Severe deformity of the humeral head was noted with large peripheral osteophytes.  An extra medullary guide was then used to outline a proposed humeral head resection which was performed with an oscillating saw at approximately 20 degrees retroversion.  The large osteophyte on the humeral neck was then removed with rongeurs.  A metal cap was then placed over the cut proximal humeral surface we then exposed the glenoid with appropriate retractors.  A circumferential labral resection was then completed.  Guidepin was then directed into the center of the glenoid implant was then reamed with a central followed by the peripheral reamers to a stable subchondral bony bed.  Preparation completed with central drill and tap.  A 25 mm lag screw was selected and the baseplate was assembled with vancomycin powder applied to the threads of the lag screw and the baseplate was then seated  with excellent purchase and fixation.  All of the peripheral locking screws  were then placed using standard technique.  Excellent fixation was achieved.  A 36/+4 glenosphere was then impacted onto the baseplate and the central locking screw was placed.  Attention was then returned back to the humeral metaphysis where the canal was opened and we broached up to a size 9.  The neutral metaphyseal reamer was then used to prepare the metaphysis.  Trial reduction showed good motion good stability good soft tissue balance.  Our final implant was then assembled.  Vancomycin powder was then spread liberally into the humeral canal.  The final implant was seated with excellent purchase and fixation.  Trial reductions ultimately showed the best soft tissue balance with the +3 poly-.  Trial polyp was removed the final poly was impacted after the implant was cleaned and dried.  Final reduction was then performed.  Shoulder demonstrate excellent motion good stability with soft tissue balance all much to our satisfaction.  The joint was then irrigated.  Final hemostasis was obtained.  The balance of the vancomycin powder was then spread liberally throughout the deep soft tissue planes.  The deltopectoral interval was reapproximated with a series of figure-of-eight #1 Vicryl sutures.  2-0 Monocryl used to the subcu layer and intracuticular 3-0 Monocryl for the skin followed by Dermabond Aquacel dressing.  Left arm was placed into a sling.  Patient was awakened, extubated, and taken to the recovery room in stable condition.  Jenetta Loges, PA-C was utilized as an Environmental consultant throughout this case, essential for help with positioning the patient, positioning extremity, tissue manipulation, implantation of the prosthesis, suture management, wound closure, and intraoperative decision-making.  Marin Shutter MD  Contact # [phone]

## 2021-09-03 NOTE — Anesthesia Procedure Notes (Signed)
Anesthesia Regional Block: Interscalene brachial plexus block   Pre-Anesthetic Checklist: , timeout performed,  Correct Patient, Correct Site, Correct Laterality,  Correct Procedure, Correct Position, site marked,  Risks and benefits discussed,  Surgical consent,  Pre-op evaluation,  At surgeon's request and post-op pain management  Laterality: Upper and Left  Prep: chloraprep       Needles:  Injection technique: Single-shot  Needle Type: Stimulator Needle - 40     Needle Length: 4cm  Needle Gauge: 22     Additional Needles:   Procedures:,,,, ultrasound used (permanent image in chart),,    Narrative:  Start time: 09/03/2021 9:07 AM End time: 09/03/2021 9:27 AM Injection made incrementally with aspirations every 5 mL.  Performed by: Personally  Anesthesiologist: Nolon Nations, MD  Additional Notes: BP cuff, SpO2 and EKG monitors applied. Sedation begun. Nerve location verified with ultrasound. Anesthetic injected incrementally, slowly, and after neg aspirations under direct u/s guidance. Good perineural spread. Tolerated well.

## 2021-09-07 ENCOUNTER — Encounter (HOSPITAL_COMMUNITY): Payer: Self-pay | Admitting: Orthopedic Surgery

## 2021-09-10 ENCOUNTER — Inpatient Hospital Stay: Payer: Medicare Other

## 2021-09-10 ENCOUNTER — Inpatient Hospital Stay: Payer: Medicare Other | Admitting: Oncology

## 2021-09-28 ENCOUNTER — Ambulatory Visit: Payer: Medicare Other | Attending: Orthopedic Surgery

## 2021-09-28 ENCOUNTER — Other Ambulatory Visit: Payer: Self-pay

## 2021-09-28 DIAGNOSIS — M25512 Pain in left shoulder: Secondary | ICD-10-CM | POA: Insufficient documentation

## 2021-09-28 DIAGNOSIS — M25612 Stiffness of left shoulder, not elsewhere classified: Secondary | ICD-10-CM | POA: Diagnosis present

## 2021-09-28 NOTE — Therapy (Signed)
[facility] [place], [place], [address] Phone: [phone]   Fax:  [phone]  Physical Therapy Evaluation  Patient Details  Name: Sandra Mann MRN: [id number] Date of Birth: [birth year] Referring Provider (PT): Supple   Encounter Date: 09/28/2021   PT End of Session - 09/28/21 1032     Visit Number 1    Number of Visits 16    Date for PT Re-Evaluation 12/11/21    PT Start Time 1033    PT Stop Time 0349    PT Time Calculation (min) 62 min    Activity Tolerance Patient tolerated treatment well    Behavior During Therapy [facility] for tasks assessed/performed             Past Medical History:  Diagnosis Date   Chest pain    GERD (gastroesophageal reflux disease)    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   Oral mass    left   Osteopenia    PONV (postoperative nausea and vomiting)    Rheumatoid arteritis ([place])    Rheumatoid arthritis ([place])    Ulcerative proctitis ([place])    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   Wears glasses     Past Surgical History:  Procedure Laterality Date   COLONOSCOPY     CYSTOSCOPY WITH INJECTION N/A 03/04/2021   Procedure: CYSTOSCOPY WITH INJECTION OF INDOCYANINE GREEN DYE;  Surgeon: Alexis Frock, MD;  [facility];  Service: Urology;  Laterality: N/A;   CYSTOSCOPY WITH URETHRAL DILATATION N/A 09/23/2020   Procedure: CYSTOSCOPY WITH URETHRAL DILATATION, EXAM UNDER ANESTHESIA, BIOPSY OF URETHRAL MASS;  Surgeon: Robley Fries, MD;  [facility];  Service: Urology;  Laterality: N/A;   EXCISION ORAL TUMOR Left 03/27/2014   Procedure: EXCISION OF ORAL CANCER;  Surgeon: Izora Gala, MD;  Location: [place];  Service: ENT;  Laterality: Left;   FINGER GANGLION CYST EXCISION Right 2007   small dip   INCISION AND DRAINAGE ABSCESS Left 06/12/2014   Procedure: LEFT INCISION AND DRAINAGE MANDIBULAR ABSCESS;  Surgeon: Izora Gala, MD;  Location: [place];   Service: ENT;  Laterality: Left;   INCISION AND DRAINAGE ABSCESS Left 11/22/2014   Procedure: INCISION AND DRAINAGE LEFT  MANDIBULAR ABSCESS;  Surgeon: Izora Gala, MD;  Location: [place];  Service: ENT;  Laterality: Left;   INCISION AND DRAINAGE OF PERITONSILLAR ABCESS Left 06/01/2014   Procedure: INCISION AND DRAINAGE Masticator Space Infection;  Surgeon: Izora Gala, MD;  Location: [place];  Service: ENT;  Laterality: Left;   IR IMAGING GUIDED PORT INSERTION  10/21/2020   LYMPH NODE DISSECTION Bilateral 03/04/2021   Procedure: LYMPH NODE DISSECTION;  Surgeon: Alexis Frock, MD;  [facility];  Service: Urology;  Laterality: Bilateral;   MULTIPLE EXTRACTIONS WITH ALVEOLOPLASTY Left 03/27/2014   Procedure: EXTRACTIONS OF NECESSARY TOOTH ;  Surgeon: Ceasar Mons, DDS;  Location: [place];  Service: Oral Surgery;  Laterality: Left;   REVERSE SHOULDER ARTHROPLASTY Left 09/03/2021   Procedure: REVERSE SHOULDER ARTHROPLASTY;  Surgeon: Justice Britain, MD;  [facility];  Service: Orthopedics;  Laterality: Left;   ROBOT ASSISTED LAPAROSCOPIC COMPLETE CYSTECT ILEAL CONDUIT N/A 03/04/2021   Procedure: XI ROBOTIC ASSISTED LAPAROSCOPIC COMPLETE CYSTECT ILEAL CONDUIT;  Surgeon: Alexis Frock, MD;  [facility];  Service: Urology;  Laterality: N/A;  6.5 HRS   ROBOTIC ASSISTED LAPAROSCOPIC HYSTERECTOMY AND SALPINGECTOMY Bilateral 03/04/2021   Procedure: XI ROBOTIC ASSISTED LAPAROSCOPIC HYSTERECTOMY AND SALPINGECTOMY;  Surgeon:  Alexis Frock, MD;  [facility];  Service: Urology;  Laterality: Bilateral;   TUBAL LIGATION  ~ 1988    There were no vitals filed for this visit.    Subjective Assessment - 09/28/21 1032     Subjective Patient reports that she had a left reverse total shoulder replacement on 09/03/21. She notes that it feels good, but hurts the most at night. She has begun to take her sling off during the day, but continues to wear it at night. She has been working on  pendulums and other wrist and hand exercises for 10 repititions three times per day.    Pertinent History RA and history of cancer    Limitations Lifting    Patient Stated Goals reach overhead, wash under her other arm, buckle her seatbelt    Currently in Pain? Yes    Pain Score 5     Pain Location Shoulder    Pain Orientation Left    Pain Descriptors / Indicators Sharp;Shooting    Pain Type Surgical pain    Pain Radiating Towards to the elbow    Pain Onset 1 to 4 weeks ago    Pain Frequency Intermittent    Aggravating Factors  reaching overhead,    Pain Relieving Factors medication    Effect of Pain on Daily Activities limited use of left arm                [facility] PT Assessment - 09/28/21 0001       Assessment   Medical Diagnosis Left RTSA    Referring Provider (PT) Supple    Onset Date/Surgical Date 09/03/21    Hand Dominance Right    Next MD Visit 10/14/21    Prior Therapy No      Precautions   Precautions Shoulder    Type of Shoulder Precautions Reverse TSA      Restrictions   Weight Bearing Restrictions Yes    LUE Weight Bearing Non weight bearing      Balance Screen   Has the patient fallen in the past 6 months No    Has the patient had a decrease in activity level because of a fear of falling?  No    Is the patient reluctant to leave their home because of a fear of falling?  No      Home Ecologist residence    Living Arrangements Spouse/significant other;Other relatives      Prior Function   Level of Independence Independent    Vocation Retired    Advertising account planner,      Cognition   Overall Cognitive Status Within Functional Limits for tasks assessed    Attention Focused    Focused Attention Appears intact    Memory Appears intact    Awareness Appears intact    Problem Solving Appears intact      Observation/Other Assessments   Focus on Therapeutic Outcomes (FOTO)  48.98      Sensation   Additional Comments  Patient reports numbness along incision      ROM / Strength   AROM / PROM / Strength AROM;PROM      AROM   AROM Assessment Site Shoulder    Right/Left Shoulder Right;Left    Right Shoulder Flexion 131 Degrees    Right Shoulder ABduction 146 Degrees    Left Shoulder Flexion 92 Degrees   limited by pulling along bicep   Left Shoulder ABduction 67 Degrees   limited by pulling along  bicep     PROM   PROM Assessment Site Shoulder    Right/Left Shoulder Left    Left Shoulder Flexion 90 Degrees   limited by pulling   Left Shoulder ABduction 67 Degrees   limited by pulling     Palpation   Palpation comment TTP: biceps and infraspinatus                        Objective measurements completed on examination: See above findings.                     PT Long Term Goals - 09/28/21 1219       PT LONG TERM GOAL #1   Title Patient will be independent with her HEP.    Time 8    Period Weeks    Status New    Target Date 11/23/21      PT LONG TERM GOAL #2   Title Patient will be able to demonstrate at least 120 degrees of active shoulder flexion with her left UE.    Time 8    Period Weeks    Status New    Target Date 11/23/21      PT LONG TERM GOAL #3   Title Patient will be able to demonstrate at least 100 degrees of left shoulder abduction for improved shoulder moiblity.    Time 8    Period Weeks    Status New    Target Date 11/23/21      PT LONG TERM GOAL #4   Title Patient will able to buckle her seatbelt with her left UE for improved safety.    Baseline unable    Time 8    Period Weeks    Status New    Target Date 11/23/21                    Plan - 09/28/21 1124     Clinical Impression Statement Patient is a 70 year old female presenting to physical therapy following a left reverse total shoulder arthroplasty. She presented with low to moderate pain severity and irritability. She requires skilled physical therapy to address her  remaining impairments to return to her prior level of function.    Personal Factors and Comorbidities Comorbidity 1;Comorbidity 2    Comorbidities RA, history of cancer,    Examination-Activity Limitations Bathing;Carry;Reach Overhead    Examination-Participation Restrictions Cleaning    Stability/Clinical Decision Making Evolving/Moderate complexity    Clinical Decision Making Moderate    Rehab Potential Good    PT Frequency 2x / week    PT Duration 8 weeks    PT Treatment/Interventions Cryotherapy;Electrical Stimulation;Moist Heat;Neuromuscular re-education;Therapeutic exercise;Therapeutic activities;Patient/family education;Manual techniques;Passive range of motion;Taping;Vasopneumatic Device    PT Next Visit Plan pulleys, nustep, ranger, and other AAROM interventions, vasopneumatic as needed    Consulted and Agree with Plan of Care Patient             Patient will benefit from skilled therapeutic intervention in order to improve the following deficits and impairments:  Decreased range of motion, Impaired UE functional use, Decreased activity tolerance, Pain, Decreased strength  Visit Diagnosis: Stiffness of left shoulder, not elsewhere classified  Acute pain of left shoulder     Problem List Patient Active Problem List   Diagnosis Date Noted   Port-A-Cath in place 01/14/2021   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

## 2021-10-02 ENCOUNTER — Other Ambulatory Visit: Payer: Self-pay

## 2021-10-02 ENCOUNTER — Ambulatory Visit: Payer: Medicare Other | Attending: Orthopedic Surgery | Admitting: Physical Therapy

## 2021-10-02 ENCOUNTER — Encounter: Payer: Self-pay | Admitting: Physical Therapy

## 2021-10-02 DIAGNOSIS — M25512 Pain in left shoulder: Secondary | ICD-10-CM | POA: Diagnosis present

## 2021-10-02 DIAGNOSIS — M25612 Stiffness of left shoulder, not elsewhere classified: Secondary | ICD-10-CM | POA: Diagnosis present

## 2021-10-02 NOTE — Therapy (Signed)
[facility] [place], [place], [address] Phone: [phone]   Fax:  [phone]  Physical Therapy Treatment  Patient Details  Name: Sandra Mann MRN: [id number] Date of Birth: April 14, 1951 Referring Provider (PT): Supple   Encounter Date: 10/02/2021   PT End of Session - 10/02/21 0819     Visit Number 2    Number of Visits 16    Date for PT Re-Evaluation 12/11/21    PT Start Time 0819    PT Stop Time 2458    PT Time Calculation (min) 35 min    Activity Tolerance Patient tolerated treatment well    Behavior During Therapy [facility] for tasks assessed/performed             Past Medical History:  Diagnosis Date   Chest pain    GERD (gastroesophageal reflux disease)    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   Oral mass    left   Osteopenia    PONV (postoperative nausea and vomiting)    Rheumatoid arteritis ([place])    Rheumatoid arthritis ([place])    Ulcerative proctitis ([place])    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   Wears glasses     Past Surgical History:  Procedure Laterality Date   COLONOSCOPY     CYSTOSCOPY WITH INJECTION N/A 03/04/2021   Procedure: CYSTOSCOPY WITH INJECTION OF INDOCYANINE GREEN DYE;  Surgeon: Alexis Frock, MD;  [facility];  Service: Urology;  Laterality: N/A;   CYSTOSCOPY WITH URETHRAL DILATATION N/A 09/23/2020   Procedure: CYSTOSCOPY WITH URETHRAL DILATATION, EXAM UNDER ANESTHESIA, BIOPSY OF URETHRAL MASS;  Surgeon: Robley Fries, MD;  [facility];  Service: Urology;  Laterality: N/A;   EXCISION ORAL TUMOR Left 03/27/2014   Procedure: EXCISION OF ORAL CANCER;  Surgeon: Izora Gala, MD;  Location: [place];  Service: ENT;  Laterality: Left;   FINGER GANGLION CYST EXCISION Right 2007   small dip   INCISION AND DRAINAGE ABSCESS Left 06/12/2014   Procedure: LEFT INCISION AND DRAINAGE MANDIBULAR ABSCESS;  Surgeon: Izora Gala, MD;  Location: [place];   Service: ENT;  Laterality: Left;   INCISION AND DRAINAGE ABSCESS Left 11/22/2014   Procedure: INCISION AND DRAINAGE LEFT  MANDIBULAR ABSCESS;  Surgeon: Izora Gala, MD;  Location: [place];  Service: ENT;  Laterality: Left;   INCISION AND DRAINAGE OF PERITONSILLAR ABCESS Left 06/01/2014   Procedure: INCISION AND DRAINAGE Masticator Space Infection;  Surgeon: Izora Gala, MD;  Location: [place];  Service: ENT;  Laterality: Left;   IR IMAGING GUIDED PORT INSERTION  10/21/2020   LYMPH NODE DISSECTION Bilateral 03/04/2021   Procedure: LYMPH NODE DISSECTION;  Surgeon: Alexis Frock, MD;  [facility];  Service: Urology;  Laterality: Bilateral;   MULTIPLE EXTRACTIONS WITH ALVEOLOPLASTY Left 03/27/2014   Procedure: EXTRACTIONS OF NECESSARY TOOTH ;  Surgeon: Ceasar Mons, DDS;  Location: [place];  Service: Oral Surgery;  Laterality: Left;   REVERSE SHOULDER ARTHROPLASTY Left 09/03/2021   Procedure: REVERSE SHOULDER ARTHROPLASTY;  Surgeon: Justice Britain, MD;  [facility];  Service: Orthopedics;  Laterality: Left;   ROBOT ASSISTED LAPAROSCOPIC COMPLETE CYSTECT ILEAL CONDUIT N/A 03/04/2021   Procedure: XI ROBOTIC ASSISTED LAPAROSCOPIC COMPLETE CYSTECT ILEAL CONDUIT;  Surgeon: Alexis Frock, MD;  [facility];  Service: Urology;  Laterality: N/A;  6.5 HRS   ROBOTIC ASSISTED LAPAROSCOPIC HYSTERECTOMY AND SALPINGECTOMY Bilateral 03/04/2021   Procedure: XI ROBOTIC ASSISTED LAPAROSCOPIC HYSTERECTOMY AND SALPINGECTOMY;  Surgeon:  Alexis Frock, MD;  [facility];  Service: Urology;  Laterality: Bilateral;   TUBAL LIGATION  ~ 1988    There were no vitals filed for this visit.   Subjective Assessment - 10/02/21 0852     Subjective Reports she hurts mostly at night. Has to go to a funeral after PT and will definately wear the sling in public. Doesn't wear the sling too much while she is at home.    Pertinent History RA and history of cancer    Limitations Lifting    Patient Stated  Goals reach overhead, wash under her other arm, buckle her seatbelt    Currently in Pain? Yes    Pain Score 4     Pain Location Shoulder    Pain Orientation Left    Pain Descriptors / Indicators Discomfort    Pain Type Surgical pain    Pain Onset 1 to 4 weeks ago    Pain Frequency Intermittent                [facility] PT Assessment - 10/02/21 0001       Assessment   Medical Diagnosis Left RTSA    Referring Provider (PT) Supple    Onset Date/Surgical Date 09/03/21    Hand Dominance Right    Next MD Visit 10/14/21    Prior Therapy No      Precautions   Precautions Shoulder    Type of Shoulder Precautions Reverse TSA                           [facility] Adult PT Treatment/Exercise - 10/02/21 0001       Exercises   Exercises Shoulder      Modalities   Modalities Vasopneumatic      Vasopneumatic   Number Minutes Vasopneumatic  10 minutes    Vasopnuematic Location  Shoulder    Vasopneumatic Pressure Low    Vasopneumatic Temperature  34      Manual Therapy   Manual Therapy Passive ROM    Passive ROM PROM reclined into flex, ER/IR, abd with gentle holds and intermittant oscillations                          PT Long Term Goals - 09/28/21 1219       PT LONG TERM GOAL #1   Title Patient will be independent with her HEP.    Time 8    Period Weeks    Status New    Target Date 11/23/21      PT LONG TERM GOAL #2   Title Patient will be able to demonstrate at least 120 degrees of active shoulder flexion with her left UE.    Time 8    Period Weeks    Status New    Target Date 11/23/21      PT LONG TERM GOAL #3   Title Patient will be able to demonstrate at least 100 degrees of left shoulder abduction for improved shoulder moiblity.    Time 8    Period Weeks    Status New    Target Date 11/23/21      PT LONG TERM GOAL #4   Title Patient will able to buckle her seatbelt with her left UE for improved safety.    Baseline unable     Time 8    Period Weeks    Status New    Target Date  11/23/21                   Plan - 10/02/21 0913     Clinical Impression Statement Patient presented in clinic with reports of mild L shoulder discomfort. Patient requested a reclined position for PROM of L shoulder today as she has a funeral to attend later. Intermittant reports of pain with PROM and intermittant catching. Intermittant oscillations provided during therex to reduce pain and guarding. Normal vasopneumatic response noted following removal of the modality.    Personal Factors and Comorbidities Comorbidity 1;Comorbidity 2    Comorbidities RA, history of cancer,    Examination-Activity Limitations Bathing;Carry;Reach Overhead    Examination-Participation Restrictions Cleaning    Stability/Clinical Decision Making Evolving/Moderate complexity    Rehab Potential Good    PT Frequency 2x / week    PT Duration 8 weeks    PT Treatment/Interventions Cryotherapy;Electrical Stimulation;Moist Heat;Neuromuscular re-education;Therapeutic exercise;Therapeutic activities;Patient/family education;Manual techniques;Passive range of motion;Taping;Vasopneumatic Device    PT Next Visit Plan pulleys, nustep, ranger, and other AAROM interventions, vasopneumatic as needed    Consulted and Agree with Plan of Care Patient             Patient will benefit from skilled therapeutic intervention in order to improve the following deficits and impairments:  Decreased range of motion, Impaired UE functional use, Decreased activity tolerance, Pain, Decreased strength  Visit Diagnosis: Stiffness of left shoulder, not elsewhere classified  Acute pain of left shoulder     Problem List Patient Active Problem List   Diagnosis Date Noted   Port-A-Cath in place 01/14/2021   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

## 2021-10-06 ENCOUNTER — Other Ambulatory Visit: Payer: Self-pay

## 2021-10-06 ENCOUNTER — Ambulatory Visit: Payer: Medicare Other | Admitting: Physical Therapy

## 2021-10-06 DIAGNOSIS — M25612 Stiffness of left shoulder, not elsewhere classified: Secondary | ICD-10-CM

## 2021-10-06 DIAGNOSIS — M25512 Pain in left shoulder: Secondary | ICD-10-CM

## 2021-10-06 NOTE — Patient Instructions (Signed)
[place] by Mali Athony Coppa Dec 6th, 2022 View at [email or web address] using code: [email or web address] Total 2 Page 1 of 1 WAND EXTERNAL ROTATION - SUPINE ER Lie on your back holding a cane or wand with both hands. On the affected side, place a small rolled up towel or pillow under your elbow. Maintain approx. 90 degree bend at the elbow with your arm approximately 30-45 degrees away from your side. GENTLE. PAIN-FREE Use your other arm to pull the wand/cane to rotate the affected arm back into a stretch. Hold and then return to starting position and then repeat. Repeat 10 Times Hold 15 Seconds Complete 1 Set Perform 4 Times a Day Closed chain flexion stretch Step 1: Stand facing table/countertop with both hands on table/countertop. Step 2: Keep hands on table/countertop and step back away from table/countertop to stretch the shoulders. DO NOT WEIGHT BEAR THROUGH AFFECTED UPPER EXTREMITY. GENTLE AND PAIN-FREE. Repeat 10 Times Hold 10 Seconds Complete 1 Set Perform 4 Times a Day

## 2021-10-06 NOTE — Therapy (Signed)
[facility] [place], [place], [address] Phone: [phone]   Fax:  [phone]  Physical Therapy Treatment  Patient Details  Name: Sandra Mann MRN: [id number] Date of Birth: 1951-06-26 Referring Provider (PT): Supple   Encounter Date: 10/06/2021   PT End of Session - 10/06/21 1016     Visit Number 3    Number of Visits 16    Date for PT Re-Evaluation 12/11/21    PT Start Time 0905    PT Stop Time 0953    PT Time Calculation (min) 48 min    Activity Tolerance Patient tolerated treatment well    Behavior During Therapy [facility] for tasks assessed/performed             Past Medical History:  Diagnosis Date   Chest pain    GERD (gastroesophageal reflux disease)    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   Oral mass    left   Osteopenia    PONV (postoperative nausea and vomiting)    Rheumatoid arteritis ([place])    Rheumatoid arthritis ([place])    Ulcerative proctitis ([place])    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   Wears glasses     Past Surgical History:  Procedure Laterality Date   COLONOSCOPY     CYSTOSCOPY WITH INJECTION N/A 03/04/2021   Procedure: CYSTOSCOPY WITH INJECTION OF INDOCYANINE GREEN DYE;  Surgeon: Alexis Frock, MD;  Location: [facility];  Service: Urology;  Laterality: N/A;   CYSTOSCOPY WITH URETHRAL DILATATION N/A 09/23/2020   Procedure: CYSTOSCOPY WITH URETHRAL DILATATION, EXAM UNDER ANESTHESIA, BIOPSY OF URETHRAL MASS;  Surgeon: Robley Fries, MD;  Location: [facility];  Service: Urology;  Laterality: N/A;   EXCISION ORAL TUMOR Left 03/27/2014   Procedure: EXCISION OF ORAL CANCER;  Surgeon: Izora Gala, MD;  Location: [facility];  Service: ENT;  Laterality: Left;   FINGER GANGLION CYST EXCISION Right 2007   small dip   INCISION AND DRAINAGE ABSCESS Left 06/12/2014   Procedure: LEFT INCISION AND DRAINAGE MANDIBULAR ABSCESS;  Surgeon: Izora Gala, MD;  Location: [facility];   Service: ENT;  Laterality: Left;   INCISION AND DRAINAGE ABSCESS Left 11/22/2014   Procedure: INCISION AND DRAINAGE LEFT  MANDIBULAR ABSCESS;  Surgeon: Izora Gala, MD;  Location: [facility];  Service: ENT;  Laterality: Left;   INCISION AND DRAINAGE OF PERITONSILLAR ABCESS Left 06/01/2014   Procedure: INCISION AND DRAINAGE Masticator Space Infection;  Surgeon: Izora Gala, MD;  Location: [facility];  Service: ENT;  Laterality: Left;   IR IMAGING GUIDED PORT INSERTION  10/21/2020   LYMPH NODE DISSECTION Bilateral 03/04/2021   Procedure: LYMPH NODE DISSECTION;  Surgeon: Alexis Frock, MD;  Location[facility];  Service: Urology;  Laterality: Bilateral;   MULTIPLE EXTRACTIONS WITH ALVEOLOPLASTY Left 03/27/2014   Procedure: EXTRACTIONS OF NECESSARY TOOTH ;  Surgeon: Ceasar Mons, DDS;  Location: [facility];  Service: Oral Surgery;  Laterality: Left;   REVERSE SHOULDER ARTHROPLASTY Left 09/03/2021   Procedure: REVERSE SHOULDER ARTHROPLASTY;  Surgeon: Justice Britain, MD;  Location: [facility];  Service: Orthopedics;  Laterality: Left;   ROBOT ASSISTED LAPAROSCOPIC COMPLETE CYSTECT ILEAL CONDUIT N/A 03/04/2021   Procedure: XI ROBOTIC ASSISTED LAPAROSCOPIC COMPLETE CYSTECT ILEAL CONDUIT;  Surgeon: Alexis Frock, MD;  Location[facility];  Service: Urology;  Laterality: N/A;  6.5 HRS   ROBOTIC ASSISTED LAPAROSCOPIC HYSTERECTOMY AND SALPINGECTOMY Bilateral 03/04/2021   Procedure: XI ROBOTIC ASSISTED LAPAROSCOPIC HYSTERECTOMY AND SALPINGECTOMY;  Surgeon:  Alexis Frock, MD;  Location[facility];  Service: Urology;  Laterality: Bilateral;   TUBAL LIGATION  ~ 1988    There were no vitals filed for this visit.   Subjective Assessment - 10/06/21 1015     Subjective COVID-19 screen performed prior to patient entering clinic.  Really sore after last treatment.  Patient was in a hurry to get here and was not wearing sling.    Pertinent History RA and history of cancer    Limitations Lifting    Patient Stated Goals reach  overhead, wash under her other arm, buckle her seatbelt    Currently in Pain? Yes    Pain Score 7     Pain Location Shoulder    Pain Orientation Left    Pain Descriptors / Indicators Discomfort    Pain Type Surgical pain    Pain Onset More than a month ago    Pain Frequency Intermittent                               [facility] Adult PT Treatment/Exercise - 10/06/21 0001       Vasopneumatic   Number Minutes Vasopneumatic  15 minutes    Vasopnuematic Location  --   Left shoulder with pillow between thorax and left elbow.   Vasopneumatic Pressure Low      Manual Therapy   Manual Therapy Passive ROM    Passive ROM In reclined supine position:  PROM with focus on left shoulder flexion and ER x 23 minutes with low laod load duration stretching technique utilized.                          PT Long Term Goals - 09/28/21 1219       PT LONG TERM GOAL #1   Title Patient will be independent with her HEP.    Time 8    Period Weeks    Status New    Target Date 11/23/21      PT LONG TERM GOAL #2   Title Patient will be able to demonstrate at least 120 degrees of active shoulder flexion with her left UE.    Time 8    Period Weeks    Status New    Target Date 11/23/21      PT LONG TERM GOAL #3   Title Patient will be able to demonstrate at least 100 degrees of left shoulder abduction for improved shoulder moiblity.    Time 8    Period Weeks    Status New    Target Date 11/23/21      PT LONG TERM GOAL #4   Title Patient will able to buckle her seatbelt with her left UE for improved safety.    Baseline unable    Time 8    Period Weeks    Status New    Target Date 11/23/21                   Plan - 10/06/21 1020     Clinical Impression Statement Patient with increased pain since last treatment.  She did well with treatment today.  Added 2 stretches to her HEP involving passive ER with cane and passive flexion stretch.  She performed with  very good technique.  Pictures provided.    Personal Factors and Comorbidities Comorbidity 1;Comorbidity 2    Comorbidities RA, history of cancer,  Examination-Activity Limitations Bathing;Carry;Reach Overhead    Examination-Participation Restrictions Cleaning    Stability/Clinical Decision Making Evolving/Moderate complexity    Rehab Potential Good    PT Frequency 2x / week    PT Duration 8 weeks    PT Treatment/Interventions Cryotherapy;Electrical Stimulation;Moist Heat;Neuromuscular re-education;Therapeutic exercise;Therapeutic activities;Patient/family education;Manual techniques;Passive range of motion;Taping;Vasopneumatic Device    PT Next Visit Plan Review HEP, progress per protocol, continue with left shoulder PROM.    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

## 2021-10-08 ENCOUNTER — Inpatient Hospital Stay: Payer: Medicare Other | Attending: Oncology

## 2021-10-08 ENCOUNTER — Other Ambulatory Visit: Payer: Self-pay

## 2021-10-08 ENCOUNTER — Inpatient Hospital Stay: Payer: Medicare Other

## 2021-10-08 ENCOUNTER — Inpatient Hospital Stay (HOSPITAL_BASED_OUTPATIENT_CLINIC_OR_DEPARTMENT_OTHER): Payer: Medicare Other | Admitting: Oncology

## 2021-10-08 VITALS — BP 147/78 | HR 79 | Temp 97.7°F | Resp 16 | Ht 63.5 in | Wt 116.1 lb

## 2021-10-08 DIAGNOSIS — Z5112 Encounter for antineoplastic immunotherapy: Secondary | ICD-10-CM | POA: Insufficient documentation

## 2021-10-08 DIAGNOSIS — C680 Malignant neoplasm of urethra: Secondary | ICD-10-CM

## 2021-10-08 DIAGNOSIS — Z95828 Presence of other vascular implants and grafts: Secondary | ICD-10-CM

## 2021-10-08 DIAGNOSIS — Z79899 Other long term (current) drug therapy: Secondary | ICD-10-CM | POA: Insufficient documentation

## 2021-10-08 LAB — CBC WITH DIFFERENTIAL (CANCER CENTER ONLY)
Abs Immature Granulocytes: 0.02 10*3/uL (ref 0.00–0.07)
Basophils Absolute: 0 10*3/uL (ref 0.0–0.1)
Basophils Relative: 1 %
Eosinophils Absolute: 0.1 10*3/uL (ref 0.0–0.5)
Eosinophils Relative: 2 %
HCT: 32.6 % — ABNORMAL LOW (ref 36.0–46.0)
Hemoglobin: 11.2 g/dL — ABNORMAL LOW (ref 12.0–15.0)
Immature Granulocytes: 0 %
Lymphocytes Relative: 23 %
Lymphs Abs: 1.7 10*3/uL (ref 0.7–4.0)
MCH: 32.7 pg (ref 26.0–34.0)
MCHC: 34.4 g/dL (ref 30.0–36.0)
MCV: 95 fL (ref 80.0–100.0)
Monocytes Absolute: 0.8 10*3/uL (ref 0.1–1.0)
Monocytes Relative: 11 %
Neutro Abs: 4.7 10*3/uL (ref 1.7–7.7)
Neutrophils Relative %: 63 %
Platelet Count: 260 10*3/uL (ref 150–400)
RBC: 3.43 MIL/uL — ABNORMAL LOW (ref 3.87–5.11)
RDW: 14.3 % (ref 11.5–15.5)
WBC Count: 7.3 10*3/uL (ref 4.0–10.5)
nRBC: 0 % (ref 0.0–0.2)

## 2021-10-08 LAB — CMP (CANCER CENTER ONLY)
ALT: 26 U/L (ref 0–44)
AST: 37 U/L (ref 15–41)
Albumin: 4 g/dL (ref 3.5–5.0)
Alkaline Phosphatase: 88 U/L (ref 38–126)
Anion gap: 11 (ref 5–15)
BUN: 13 mg/dL (ref 8–23)
CO2: 23 mmol/L (ref 22–32)
Calcium: 8.9 mg/dL (ref 8.9–10.3)
Chloride: 106 mmol/L (ref 98–111)
Creatinine: 0.78 mg/dL (ref 0.44–1.00)
GFR, Estimated: >60 mL/min (ref >60)
Glucose, Bld: 91 mg/dL (ref 70–99)
Potassium: 3.9 mmol/L (ref 3.5–5.1)
Sodium: 140 mmol/L (ref 135–145)
Total Bilirubin: 0.8 mg/dL (ref 0.3–1.2)
Total Protein: 6.9 g/dL (ref 6.5–8.1)

## 2021-10-08 LAB — TSH: TSH: 1.19 u[IU]/mL (ref 0.308–3.960)

## 2021-10-08 MED ORDER — SODIUM CHLORIDE 0.9% FLUSH
10.0000 mL | INTRAVENOUS | Status: DC | PRN
  Administered 2021-10-08: 10 mL

## 2021-10-08 MED ORDER — SODIUM CHLORIDE 0.9 % IV SOLN: Freq: Once | INTRAVENOUS | Status: AC

## 2021-10-08 MED ORDER — SODIUM CHLORIDE 0.9% FLUSH
10.0000 mL | Freq: Once | INTRAVENOUS | Status: AC
  Administered 2021-10-08: 10 mL

## 2021-10-08 MED ORDER — SODIUM CHLORIDE 0.9 % IV SOLN
480.0000 mg | Freq: Once | INTRAVENOUS | Status: AC
  Administered 2021-10-08: 480 mg via INTRAVENOUS
  Filled 2021-10-08: qty 48

## 2021-10-08 MED ORDER — HEPARIN SOD (PORK) LOCK FLUSH 100 UNIT/ML IV SOLN
500.0000 [IU] | Freq: Once | INTRAVENOUS | Status: AC | PRN
  Administered 2021-10-08: 500 [IU]

## 2021-10-08 NOTE — Patient Instructions (Signed)
[facility]  Discharge Instructions: Thank you for choosing [facility] to provide your oncology and hematology care.   If you have a lab appointment with the [facility], please go directly to the [facility] and check in at the registration area.   Wear comfortable clothing and clothing appropriate for easy access to any Portacath or PICC line.   We strive to give you quality time with your provider. You may need to reschedule your appointment if you arrive late (15 or more minutes).  Arriving late affects you and other patients whose appointments are after yours.  Also, if you miss three or more appointments without notifying the office, you may be dismissed from the clinic at the provider's discretion.      For prescription refill requests, have your pharmacy contact our office and allow 72 hours for refills to be completed.    Today you received the following chemotherapy and/or immunotherapy agents Opdivo      To help prevent nausea and vomiting after your treatment, we encourage you to take your nausea medication as directed.  BELOW ARE SYMPTOMS THAT SHOULD BE REPORTED IMMEDIATELY: *FEVER GREATER THAN 100.4 F (38 C) OR HIGHER *CHILLS OR SWEATING *NAUSEA AND VOMITING THAT IS NOT CONTROLLED WITH YOUR NAUSEA MEDICATION *UNUSUAL SHORTNESS OF BREATH *UNUSUAL BRUISING OR BLEEDING *URINARY PROBLEMS (pain or burning when urinating, or frequent urination) *BOWEL PROBLEMS (unusual diarrhea, constipation, pain near the anus) TENDERNESS IN MOUTH AND THROAT WITH OR WITHOUT PRESENCE OF ULCERS (sore throat, sores in mouth, or a toothache) UNUSUAL RASH, SWELLING OR PAIN  UNUSUAL VAGINAL DISCHARGE OR ITCHING   Items with * indicate a potential emergency and should be followed up as soon as possible or go to the Emergency Department if any problems should occur.  Please show the CHEMOTHERAPY ALERT CARD or IMMUNOTHERAPY ALERT CARD at check-in to the  Emergency Department and triage nurse.  Should you have questions after your visit or need to cancel or reschedule your appointment, please contact [facility]  Dept: [phone]  and follow the prompts.  Office hours are 8:00 a.m. to 4:30 p.m. Monday - Friday. Please note that voicemails left after 4:00 p.m. may not be returned until the following business day.  We are closed weekends and major holidays. You have access to a nurse at all times for urgent questions. Please call the main number to the clinic Dept: [phone] and follow the prompts.   For any non-urgent questions, you may also contact your provider using MyChart. We now offer e-Visits for anyone 79 and older to request care online for non-urgent symptoms. For details visit [email or web address].   Also download the MyChart app! Go to the app store, search "MyChart", open the app, select [facility], and log in with your MyChart username and password.  Due to Covid, a mask is required upon entering the hospital/clinic. If you do not have a mask, one will be given to you upon arrival. For doctor visits, patients may have 1 support person aged 91 or older with them. For treatment visits, patients cannot have anyone with them due to current Covid guidelines and our immunocompromised population.

## 2021-10-08 NOTE — Progress Notes (Signed)
Hematology and Oncology Follow Up Visit  Sandra Mann [id number] 09/15/1951 70 y.o. 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    Prior Therapy: She is status post cystoscopy and biopsy in November 2021 which showed high-grade urothelial carcinoma. Neoadjuvant chemotherapy utilizing gemcitabine and cisplatin started on October 29, 2020.  She completed 4 cycles of therapy on January 07, 2021. She is status post robotic assisted laparoscopic cystectomy, hysterectomy and lymph node dissection completed on Mar 04, 2021 completed by Dr. Tammi Klippel.  He final pathology showed T4N1 disease.  She had 1 out of 16 lymph node involvement.  Current therapy: Nivolumab 480 mg every 4 weeks started in July 2022.  She is here for cycle 5 of therapy.    Interim History: Sandra Mann returns today for a repeat evaluation.  Since the last visit, she underwent shoulder surgery currently recovering without any major complaints.  She continues to tolerate nivolumab without any recent issues.  She had reported some skin eruptions which are manageable and limited in nature.  She denies any recent hospitalizations or illnesses.  She denies any shortness of breath or difficulty breathing.  She denies any changes in her bowel habits.  Performance status quality of life remain excellent.  Medications: Reviewed without changes. Current Outpatient Medications  Medication Sig Dispense Refill   acetaminophen (TYLENOL) 500 MG tablet Take 500 mg by mouth every 6 (six) hours as needed for moderate pain.     cyclobenzaprine (FLEXERIL) 10 MG tablet Take 1 tablet (10 mg total) by mouth 3 (three) times daily as needed for muscle spasms. 30 tablet 1   cycloSPORINE (RESTASIS) 0.05 % ophthalmic emulsion Place 1 drop into both eyes 2 (two) times daily.     folic acid (FOLVITE) 585 MCG tablet  Take 1,600 mcg by mouth daily.      lidocaine-prilocaine (EMLA) cream Apply 1 application topically as needed. (Patient taking differently: Apply 1 application topically as needed (for the port).) 30 g 0   Melatonin 10 MG TABS Take 10 mg by mouth at bedtime.     mesalamine (CANASA) 1000 MG suppository Place 1,000 mg rectally every other day. At bedtime     methotrexate (RHEUMATREX) 2.5 MG tablet Take 20 mg by mouth every Tuesday.     Multiple Vitamin (MULTIVITAMIN WITH MINERALS) TABS tablet Take 1 tablet by mouth at bedtime. Silver     naproxen (NAPROSYN) 500 MG tablet Take 1 tablet (500 mg total) by mouth 2 (two) times daily with a meal. 60 tablet 1   omeprazole (PRILOSEC) 40 MG capsule Take 40 mg by mouth daily.     ondansetron (ZOFRAN) 4 MG tablet Take 1 tablet (4 mg total) by mouth every 8 (eight) hours as needed for nausea or vomiting. (Patient not taking: Reported on 09/28/2021) 10 tablet 0   oxyCODONE-acetaminophen (PERCOCET) 5-325 MG tablet Take 1 tablet by mouth every 4 (four) hours as needed (max 6 q). (Patient not taking: Reported on 09/28/2021) 20 tablet 0   traMADol (ULTRAM) 50 MG tablet Take 1-2 tablets (50-100 mg total) by mouth every 6 (six) hours as needed for moderate pain. (Patient not taking: Reported on 09/28/2021) 20 tablet 0   No current facility-administered medications for this visit.     Allergies:  Allergies  Allergen Reactions   Codeine Nausea And Vomiting   Sulfa Antibiotics Nausea And Vomiting   Vicodin [  Hydrocodone-Acetaminophen] Nausea And Vomiting      Physical Exam:     Blood pressure (!) 147/78, pulse 79, temperature 97.7 F (36.5 C), temperature source Temporal, resp. rate 16, height 5' 3.5" (1.613 m), weight 116 lb 1.6 oz (52.7 kg), SpO2 100 %.     ECOG: 0   General appearance: Alert, awake without any distress. Head: Atraumatic without abnormalities Oropharynx: Without any thrush or ulcers. Eyes: No scleral icterus. Lymph nodes: No  lymphadenopathy noted in the cervical, supraclavicular, or axillary nodes Heart:regular rate and rhythm, without any murmurs or gallops.   Lung: Clear to auscultation without any rhonchi, wheezes or dullness to percussion. Abdomin: Soft, nontender without any shifting dullness or ascites. Musculoskeletal: No clubbing or cyanosis. Neurological: No motor or sensory deficits. Skin: No rashes or lesions.            Lab Results: Lab Results  Component Value Date   WBC 7.3 10/08/2021   HGB 11.2 (L) 10/08/2021   HCT 32.6 (L) 10/08/2021   MCV 95.0 10/08/2021   PLT 260 10/08/2021     Chemistry      Component Value Date/Time   NA 135 08/13/2021 0829   K 4.0 08/13/2021 0829   CL 103 08/13/2021 0829   CO2 25 08/13/2021 0829   BUN 14 08/13/2021 0829   CREATININE 0.66 08/13/2021 0829      Component Value Date/Time   CALCIUM 8.9 08/13/2021 0829   ALKPHOS 70 08/13/2021 0829   AST 29 08/13/2021 0829   ALT 18 08/13/2021 0829   BILITOT 0.8 08/13/2021 0829        Impression and Plan:  70 year old woman with:   1.  Ureteral cancer diagnosed in 2021.  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   She continues to receive nivolumab without any major complications.  Risks and benefits of continuing were reviewed.  Complications include dermatological issues, immune mediated issues as well as GI toxicities.  Plan is to update her staging scans and the next 4 weeks.  Alternative treatment options such as Padcev could be considered if she has relapsed disease.   2.  IV access: Port-A-Cath remains in place and continues to be in use at this time.   3.  Antiemetics: Compazine is available to her without any nausea or vomiting.   4.  Immune mediated complications: I continue to educate her about potential issues including pneumonitis, colitis and thyroid disease.   5.  Goals of care: Her disease remains curable and aggressive measures are warranted.   6.  Follow-up:  In 1 month for the next cycle of therapy.   30  minutes were spent on this visit.  Time was dedicated to reviewing laboratory data, disease status update and outlining future plan of care.  [name], MD 12/8/202211:56 AM

## 2021-10-09 ENCOUNTER — Ambulatory Visit: Payer: Medicare Other | Admitting: Physical Therapy

## 2021-10-09 DIAGNOSIS — M25612 Stiffness of left shoulder, not elsewhere classified: Secondary | ICD-10-CM | POA: Diagnosis not present

## 2021-10-09 DIAGNOSIS — M25512 Pain in left shoulder: Secondary | ICD-10-CM

## 2021-10-09 NOTE — Therapy (Signed)
[facility] [place], [place], [address] Phone: [phone]   Fax:  [phone]  Physical Therapy Treatment  Patient Details  Name: Sandra Mann MRN: [id number] Date of Birth: 05-May-1951 Referring Provider (PT): Supple   Encounter Date: 10/09/2021   PT End of Session - 10/09/21 0950     Visit Number 4    Number of Visits 16    Date for PT Re-Evaluation 12/11/21    PT Start Time 0904    PT Stop Time 0948    PT Time Calculation (min) 44 min    Activity Tolerance Patient tolerated treatment well    Behavior During Therapy [facility] for tasks assessed/performed             Past Medical History:  Diagnosis Date   Chest pain    GERD (gastroesophageal reflux disease)    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   Oral mass    left   Osteopenia    PONV (postoperative nausea and vomiting)    Rheumatoid arteritis ([place])    Rheumatoid arthritis ([place])    Ulcerative proctitis ([place])    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   Wears glasses     Past Surgical History:  Procedure Laterality Date   COLONOSCOPY     CYSTOSCOPY WITH INJECTION N/A 03/04/2021   Procedure: CYSTOSCOPY WITH INJECTION OF INDOCYANINE GREEN DYE;  Surgeon: Alexis Frock, MD;  [facility];  Service: Urology;  Laterality: N/A;   CYSTOSCOPY WITH URETHRAL DILATATION N/A 09/23/2020   Procedure: CYSTOSCOPY WITH URETHRAL DILATATION, EXAM UNDER ANESTHESIA, BIOPSY OF URETHRAL MASS;  Surgeon: Robley Fries, MD;  [facility];  Service: Urology;  Laterality: N/A;   EXCISION ORAL TUMOR Left 03/27/2014   Procedure: EXCISION OF ORAL CANCER;  Surgeon: Izora Gala, MD;  Location: [place];  Service: ENT;  Laterality: Left;   FINGER GANGLION CYST EXCISION Right 2007   small dip   INCISION AND DRAINAGE ABSCESS Left 06/12/2014   Procedure: LEFT INCISION AND DRAINAGE MANDIBULAR ABSCESS;  Surgeon: Izora Gala, MD;  Location: [place];   Service: ENT;  Laterality: Left;   INCISION AND DRAINAGE ABSCESS Left 11/22/2014   Procedure: INCISION AND DRAINAGE LEFT  MANDIBULAR ABSCESS;  Surgeon: Izora Gala, MD;  Location: [place];  Service: ENT;  Laterality: Left;   INCISION AND DRAINAGE OF PERITONSILLAR ABCESS Left 06/01/2014   Procedure: INCISION AND DRAINAGE Masticator Space Infection;  Surgeon: Izora Gala, MD;  Location: [place];  Service: ENT;  Laterality: Left;   IR IMAGING GUIDED PORT INSERTION  10/21/2020   LYMPH NODE DISSECTION Bilateral 03/04/2021   Procedure: LYMPH NODE DISSECTION;  Surgeon: Alexis Frock, MD;  [facility];  Service: Urology;  Laterality: Bilateral;   MULTIPLE EXTRACTIONS WITH ALVEOLOPLASTY Left 03/27/2014   Procedure: EXTRACTIONS OF NECESSARY TOOTH ;  Surgeon: Ceasar Mons, DDS;  Location: [place];  Service: Oral Surgery;  Laterality: Left;   REVERSE SHOULDER ARTHROPLASTY Left 09/03/2021   Procedure: REVERSE SHOULDER ARTHROPLASTY;  Surgeon: Justice Britain, MD;  [facility];  Service: Orthopedics;  Laterality: Left;   ROBOT ASSISTED LAPAROSCOPIC COMPLETE CYSTECT ILEAL CONDUIT N/A 03/04/2021   Procedure: XI ROBOTIC ASSISTED LAPAROSCOPIC COMPLETE CYSTECT ILEAL CONDUIT;  Surgeon: Alexis Frock, MD;  [facility];  Service: Urology;  Laterality: N/A;  6.5 HRS   ROBOTIC ASSISTED LAPAROSCOPIC HYSTERECTOMY AND SALPINGECTOMY Bilateral 03/04/2021   Procedure: XI ROBOTIC ASSISTED LAPAROSCOPIC HYSTERECTOMY AND SALPINGECTOMY;  Surgeon:  Alexis Frock, MD;  [facility];  Service: Urology;  Laterality: Bilateral;   TUBAL LIGATION  ~ 1988    There were no vitals filed for this visit.   Subjective Assessment - 10/09/21 0949     Subjective COVID-19 screen performed prior to patient entering clinic.  Forgot to take pain medication.  Patient doing HEP three times a day.    Pertinent History RA and history of cancer    Limitations Lifting    Patient Stated Goals reach overhead, wash under her  other arm, buckle her seatbelt    Currently in Pain? Yes    Pain Score 2     Pain Location Shoulder    Pain Orientation Left    Pain Type Surgical pain    Pain Onset More than a month ago                [facility] PT Assessment - 10/09/21 0001       PROM   Left Shoulder External Rotation 25 Degrees                           [facility] Adult PT Treatment/Exercise - 10/09/21 0001       Modalities   Modalities Vasopneumatic      Vasopneumatic   Number Minutes Vasopneumatic  15 minutes    Vasopnuematic Location  --   Left shoulder with pillow between elbow and thorax.   Vasopneumatic Pressure Low      Manual Therapy   Manual Therapy Passive ROM    Passive ROM In reclined position:  PROM X 23 minutes into left shoulder flexion, ER and abduction with gentle LLLDS technique utilized.                          PT Long Term Goals - 09/28/21 1219       PT LONG TERM GOAL #1   Title Patient will be independent with her HEP.    Time 8    Period Weeks    Status New    Target Date 11/23/21      PT LONG TERM GOAL #2   Title Patient will be able to demonstrate at least 120 degrees of active shoulder flexion with her left UE.    Time 8    Period Weeks    Status New    Target Date 11/23/21      PT LONG TERM GOAL #3   Title Patient will be able to demonstrate at least 100 degrees of left shoulder abduction for improved shoulder moiblity.    Time 8    Period Weeks    Status New    Target Date 11/23/21      PT LONG TERM GOAL #4   Title Patient will able to buckle her seatbelt with her left UE for improved safety.    Baseline unable    Time 8    Period Weeks    Status New    Target Date 11/23/21                   Plan - 10/09/21 1248     Clinical Impression Statement Patient's range of motion is improving nicely per protocol.  Her passive left shoulder ER was measured to 25 degrees today.    Personal Factors and Comorbidities  Comorbidity 1;Comorbidity 2    Comorbidities RA, history of cancer,    Examination-Activity Limitations Bathing;Carry;Reach  Overhead    Examination-Participation Restrictions Cleaning    Stability/Clinical Decision Making Evolving/Moderate complexity    Rehab Potential Good    PT Frequency 2x / week    PT Duration 8 weeks    PT Treatment/Interventions Cryotherapy;Electrical Stimulation;Moist Heat;Neuromuscular re-education;Therapeutic exercise;Therapeutic activities;Patient/family education;Manual techniques;Passive range of motion;Taping;Vasopneumatic Device    PT Next Visit Plan Review HEP, progress per protocol, continue with left shoulder PROM.  Pulleys and UE Ranger.    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

## 2021-10-13 ENCOUNTER — Other Ambulatory Visit: Payer: Self-pay

## 2021-10-13 ENCOUNTER — Ambulatory Visit: Payer: Medicare Other | Admitting: Physical Therapy

## 2021-10-13 DIAGNOSIS — M25512 Pain in left shoulder: Secondary | ICD-10-CM

## 2021-10-13 DIAGNOSIS — M25612 Stiffness of left shoulder, not elsewhere classified: Secondary | ICD-10-CM | POA: Diagnosis not present

## 2021-10-13 NOTE — Therapy (Signed)
[facility] [place], [place], [address] Phone: [phone]   Fax:  [phone]  Physical Therapy Treatment  Patient Details  Name: Sandra Mann MRN: [id number] Date of Birth: [birth year] Referring Provider (PT): Supple   Encounter Date: 10/13/2021   PT End of Session - 10/13/21 0942     Visit Number 5    Number of Visits 16    Date for PT Re-Evaluation 12/11/21    PT Start Time 0817    PT Stop Time 0900    PT Time Calculation (min) 43 min    Activity Tolerance Patient tolerated treatment well    Behavior During Therapy [facility] for tasks assessed/performed             Past Medical History:  Diagnosis Date   Chest pain    GERD (gastroesophageal reflux disease)    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   Oral mass    left   Osteopenia    PONV (postoperative nausea and vomiting)    Rheumatoid arteritis ([place])    Rheumatoid arthritis ([place])    Ulcerative proctitis ([place])    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   Wears glasses     Past Surgical History:  Procedure Laterality Date   COLONOSCOPY     CYSTOSCOPY WITH INJECTION N/A 03/04/2021   Procedure: CYSTOSCOPY WITH INJECTION OF INDOCYANINE GREEN DYE;  Surgeon: Alexis Frock, MD;  [facility];  Service: Urology;  Laterality: N/A;   CYSTOSCOPY WITH URETHRAL DILATATION N/A 09/23/2020   Procedure: CYSTOSCOPY WITH URETHRAL DILATATION, EXAM UNDER ANESTHESIA, BIOPSY OF URETHRAL MASS;  Surgeon: Robley Fries, MD;  [facility];  Service: Urology;  Laterality: N/A;   EXCISION ORAL TUMOR Left 03/27/2014   Procedure: EXCISION OF ORAL CANCER;  Surgeon: Izora Gala, MD;  Location: [place];  Service: ENT;  Laterality: Left;   FINGER GANGLION CYST EXCISION Right 2007   small dip   INCISION AND DRAINAGE ABSCESS Left 06/12/2014   Procedure: LEFT INCISION AND DRAINAGE MANDIBULAR ABSCESS;  Surgeon: Izora Gala, MD;  Location: [place];   Service: ENT;  Laterality: Left;   INCISION AND DRAINAGE ABSCESS Left 11/22/2014   Procedure: INCISION AND DRAINAGE LEFT  MANDIBULAR ABSCESS;  Surgeon: Izora Gala, MD;  Location: [place];  Service: ENT;  Laterality: Left;   INCISION AND DRAINAGE OF PERITONSILLAR ABCESS Left 06/01/2014   Procedure: INCISION AND DRAINAGE Masticator Space Infection;  Surgeon: Izora Gala, MD;  Location: [place];  Service: ENT;  Laterality: Left;   IR IMAGING GUIDED PORT INSERTION  10/21/2020   LYMPH NODE DISSECTION Bilateral 03/04/2021   Procedure: LYMPH NODE DISSECTION;  Surgeon: Alexis Frock, MD;  [facility];  Service: Urology;  Laterality: Bilateral;   MULTIPLE EXTRACTIONS WITH ALVEOLOPLASTY Left 03/27/2014   Procedure: EXTRACTIONS OF NECESSARY TOOTH ;  Surgeon: Ceasar Mons, DDS;  Location: [place];  Service: Oral Surgery;  Laterality: Left;   REVERSE SHOULDER ARTHROPLASTY Left 09/03/2021   Procedure: REVERSE SHOULDER ARTHROPLASTY;  Surgeon: Justice Britain, MD;  [facility];  Service: Orthopedics;  Laterality: Left;   ROBOT ASSISTED LAPAROSCOPIC COMPLETE CYSTECT ILEAL CONDUIT N/A 03/04/2021   Procedure: XI ROBOTIC ASSISTED LAPAROSCOPIC COMPLETE CYSTECT ILEAL CONDUIT;  Surgeon: Alexis Frock, MD;  [facility];  Service: Urology;  Laterality: N/A;  6.5 HRS   ROBOTIC ASSISTED LAPAROSCOPIC HYSTERECTOMY AND SALPINGECTOMY Bilateral 03/04/2021   Procedure: XI ROBOTIC ASSISTED LAPAROSCOPIC HYSTERECTOMY AND SALPINGECTOMY;  Surgeon:  Alexis Frock, MD;  [facility];  Service: Urology;  Laterality: Bilateral;   TUBAL LIGATION  ~ 1988    There were no vitals filed for this visit.   Subjective Assessment - 10/13/21 0930     Subjective COVID-19 screen performed prior to patient entering clinic.  Doctor was very pleased.  No longer needs sling and can massage scar with Vit. E.    Pertinent History RA and history of cancer    Limitations Lifting    Patient Stated Goals reach overhead, wash  under her other arm, buckle her seatbelt    Currently in Pain? Yes    Pain Score 2     Pain Location Shoulder    Pain Orientation Left    Pain Descriptors / Indicators Discomfort    Pain Type Surgical pain    Pain Onset More than a month ago                               [facility] Adult PT Treatment/Exercise - 10/13/21 0001       Vasopneumatic   Number Minutes Vasopneumatic  15 minutes    Vasopnuematic Location  --   Left shoulder with pillowbetween thorax and elbow.   Vasopneumatic Pressure Low      Manual Therapy   Manual Therapy Passive ROM    Passive ROM In supine:  PROM 23 minutes into left shoulder flexion, abduction and ER using LLLDS technique.                          PT Long Term Goals - 09/28/21 1219       PT LONG TERM GOAL #1   Title Patient will be independent with her HEP.    Time 8    Period Weeks    Status New    Target Date 11/23/21      PT LONG TERM GOAL #2   Title Patient will be able to demonstrate at least 120 degrees of active shoulder flexion with her left UE.    Time 8    Period Weeks    Status New    Target Date 11/23/21      PT LONG TERM GOAL #3   Title Patient will be able to demonstrate at least 100 degrees of left shoulder abduction for improved shoulder moiblity.    Time 8    Period Weeks    Status New    Target Date 11/23/21      PT LONG TERM GOAL #4   Title Patient will able to buckle her seatbelt with her left UE for improved safety.    Baseline unable    Time 8    Period Weeks    Status New    Target Date 11/23/21                   Plan - 10/13/21 0946     Clinical Impression Statement Patient progressing well.  Doctor pleased.  No longer sling.    Personal Factors and Comorbidities Comorbidity 1;Comorbidity 2    Comorbidities RA, history of cancer,    Examination-Activity Limitations Bathing;Carry;Reach Overhead    Examination-Participation Restrictions Cleaning     Stability/Clinical Decision Making Evolving/Moderate complexity    Rehab Potential Good    PT Treatment/Interventions Cryotherapy;Electrical Stimulation;Moist Heat;Neuromuscular re-education;Therapeutic exercise;Therapeutic activities;Patient/family education;Manual techniques;Passive range of motion;Taping;Vasopneumatic Device    PT Next Visit Plan Review HEP,  progress per protocol, continue with left shoulder PROM.  Pulleys and UE Ranger.    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

## 2021-10-15 ENCOUNTER — Other Ambulatory Visit: Payer: Self-pay

## 2021-10-15 ENCOUNTER — Ambulatory Visit: Payer: Medicare Other | Admitting: Physical Therapy

## 2021-10-15 ENCOUNTER — Encounter: Payer: Self-pay | Admitting: Physical Therapy

## 2021-10-15 DIAGNOSIS — M25612 Stiffness of left shoulder, not elsewhere classified: Secondary | ICD-10-CM

## 2021-10-15 DIAGNOSIS — M25512 Pain in left shoulder: Secondary | ICD-10-CM

## 2021-10-15 NOTE — Therapy (Signed)
[facility] [place], [place], [address] Phone: [phone]   Fax:  [phone]  Physical Therapy Treatment  Patient Details  Name: Sandra Mann MRN: [id number] Date of Birth: January 30, 1951 Referring Provider (PT): Supple   Encounter Date: 10/15/2021   PT End of Session - 10/15/21 0900     Visit Number 6    Number of Visits 16    Date for PT Re-Evaluation 12/11/21    PT Start Time 0817    PT Stop Time 0902    PT Time Calculation (min) 45 min    Activity Tolerance Patient tolerated treatment well    Behavior During Therapy [facility] for tasks assessed/performed             Past Medical History:  Diagnosis Date   Chest pain    GERD (gastroesophageal reflux disease)    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   Oral mass    left   Osteopenia    PONV (postoperative nausea and vomiting)    Rheumatoid arteritis ([place])    Rheumatoid arthritis ([place])    Ulcerative proctitis ([place])    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   Wears glasses     Past Surgical History:  Procedure Laterality Date   COLONOSCOPY     CYSTOSCOPY WITH INJECTION N/A 03/04/2021   Procedure: CYSTOSCOPY WITH INJECTION OF INDOCYANINE GREEN DYE;  Surgeon: Alexis Frock, MD;  [facility];  Service: Urology;  Laterality: N/A;   CYSTOSCOPY WITH URETHRAL DILATATION N/A 09/23/2020   Procedure: CYSTOSCOPY WITH URETHRAL DILATATION, EXAM UNDER ANESTHESIA, BIOPSY OF URETHRAL MASS;  Surgeon: Robley Fries, MD;  [facility];  Service: Urology;  Laterality: N/A;   EXCISION ORAL TUMOR Left 03/27/2014   Procedure: EXCISION OF ORAL CANCER;  Surgeon: Izora Gala, MD;  Location: [place];  Service: ENT;  Laterality: Left;   FINGER GANGLION CYST EXCISION Right 2007   small dip   INCISION AND DRAINAGE ABSCESS Left 06/12/2014   Procedure: LEFT INCISION AND DRAINAGE MANDIBULAR ABSCESS;  Surgeon: Izora Gala, MD;  Location: [place];   Service: ENT;  Laterality: Left;   INCISION AND DRAINAGE ABSCESS Left 11/22/2014   Procedure: INCISION AND DRAINAGE LEFT  MANDIBULAR ABSCESS;  Surgeon: Izora Gala, MD;  Location: [place];  Service: ENT;  Laterality: Left;   INCISION AND DRAINAGE OF PERITONSILLAR ABCESS Left 06/01/2014   Procedure: INCISION AND DRAINAGE Masticator Space Infection;  Surgeon: Izora Gala, MD;  Location: [place];  Service: ENT;  Laterality: Left;   IR IMAGING GUIDED PORT INSERTION  10/21/2020   LYMPH NODE DISSECTION Bilateral 03/04/2021   Procedure: LYMPH NODE DISSECTION;  Surgeon: Alexis Frock, MD;  [facility];  Service: Urology;  Laterality: Bilateral;   MULTIPLE EXTRACTIONS WITH ALVEOLOPLASTY Left 03/27/2014   Procedure: EXTRACTIONS OF NECESSARY TOOTH ;  Surgeon: Ceasar Mons, DDS;  Location: [place];  Service: Oral Surgery;  Laterality: Left;   REVERSE SHOULDER ARTHROPLASTY Left 09/03/2021   Procedure: REVERSE SHOULDER ARTHROPLASTY;  Surgeon: Justice Britain, MD;  [facility];  Service: Orthopedics;  Laterality: Left;   ROBOT ASSISTED LAPAROSCOPIC COMPLETE CYSTECT ILEAL CONDUIT N/A 03/04/2021   Procedure: XI ROBOTIC ASSISTED LAPAROSCOPIC COMPLETE CYSTECT ILEAL CONDUIT;  Surgeon: Alexis Frock, MD;  [facility];  Service: Urology;  Laterality: N/A;  6.5 HRS   ROBOTIC ASSISTED LAPAROSCOPIC HYSTERECTOMY AND SALPINGECTOMY Bilateral 03/04/2021   Procedure: XI ROBOTIC ASSISTED LAPAROSCOPIC HYSTERECTOMY AND SALPINGECTOMY;  Surgeon:  Alexis Frock, MD;  [facility];  Service: Urology;  Laterality: Bilateral;   TUBAL LIGATION  ~ 1988    There were no vitals filed for this visit.   Subjective Assessment - 10/15/21 0859     Subjective COVID-19 screen performed prior to patient entering clinic. Reporting more discomfort in L deltoid region. Reports ache of L deltoid region wakes her at night.    Pertinent History RA and history of cancer    Limitations Lifting    Patient Stated Goals  reach overhead, wash under her other arm, buckle her seatbelt    Currently in Pain? Yes    Pain Score 4     Pain Location Shoulder    Pain Orientation Left    Pain Descriptors / Indicators Aching    Pain Type Surgical pain    Pain Onset More than a month ago    Pain Frequency Intermittent                [facility] PT Assessment - 10/15/21 0001       Assessment   Medical Diagnosis Left RTSA    Referring Provider (PT) Supple    Onset Date/Surgical Date 09/03/21    Hand Dominance Right    Next MD Visit 11/23/2021    Prior Therapy No      Precautions   Precautions Shoulder    Type of Shoulder Precautions Reverse TSA                           [facility] Adult PT Treatment/Exercise - 10/15/21 0001       Shoulder Exercises: Seated   External Rotation AAROM;Left;20 reps    Flexion AAROM;Left;20 reps      Shoulder Exercises: Pulleys   Flexion 5 minutes      Shoulder Exercises: ROM/Strengthening   UBE (Upper Arm Bike) 120 RPM x4 nin (forward/backward)    Ranger seated; flex/ext x3 min, circles x10 reps      Modalities   Modalities Vasopneumatic      Vasopneumatic   Number Minutes Vasopneumatic  10 minutes    Vasopnuematic Location  Shoulder    Vasopneumatic Pressure Low    Vasopneumatic Temperature  34      Manual Therapy   Manual Therapy Passive ROM    Passive ROM PROM into left shoulder flex/ER/IR reclined with gentle holds at end range                          PT Long Term Goals - 09/28/21 1219       PT LONG TERM GOAL #1   Title Patient will be independent with her HEP.    Time 8    Period Weeks    Status New    Target Date 11/23/21      PT LONG TERM GOAL #2   Title Patient will be able to demonstrate at least 120 degrees of active shoulder flexion with her left UE.    Time 8    Period Weeks    Status New    Target Date 11/23/21      PT LONG TERM GOAL #3   Title Patient will be able to demonstrate at least 100 degrees of  left shoulder abduction for improved shoulder moiblity.    Time 8    Period Weeks    Status New    Target Date 11/23/21      PT LONG  TERM GOAL #4   Title Patient will able to buckle her seatbelt with her left UE for improved safety.    Baseline unable    Time 8    Period Weeks    Status New    Target Date 11/23/21                   Plan - 10/15/21 0906     Clinical Impression Statement Patient presented in clinic with reports of discomfort and ache of L deltoids region. Patient progressed to more seated AAROM exercises to progress ROM. More multimodal cueing required for [facility] ER and flexion in reclined position per patient request. Firm end feels and smooth arc of motion noted during PROM. Intermittant reports of pain in upper humeral region. Normal modalities response noted following removal of the modalities.    Personal Factors and Comorbidities Comorbidity 1;Comorbidity 2    Comorbidities RA, history of cancer,    Examination-Activity Limitations Bathing;Carry;Reach Overhead    Examination-Participation Restrictions Cleaning    Stability/Clinical Decision Making Evolving/Moderate complexity    Rehab Potential Good    PT Frequency 2x / week    PT Duration 8 weeks    PT Treatment/Interventions Cryotherapy;Electrical Stimulation;Moist Heat;Neuromuscular re-education;Therapeutic exercise;Therapeutic activities;Patient/family education;Manual techniques;Passive range of motion;Taping;Vasopneumatic Device    PT Next Visit Plan Review HEP, progress per protocol, continue with left shoulder PROM.  Pulleys and UE Ranger.    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

## 2021-10-19 ENCOUNTER — Encounter: Payer: Self-pay | Admitting: Physical Therapy

## 2021-10-19 ENCOUNTER — Ambulatory Visit: Payer: Medicare Other | Admitting: Physical Therapy

## 2021-10-19 ENCOUNTER — Other Ambulatory Visit: Payer: Self-pay

## 2021-10-19 DIAGNOSIS — M25612 Stiffness of left shoulder, not elsewhere classified: Secondary | ICD-10-CM

## 2021-10-19 DIAGNOSIS — M25512 Pain in left shoulder: Secondary | ICD-10-CM

## 2021-10-19 NOTE — Therapy (Signed)
[facility] [place], [place], [address] Phone: [phone]   Fax:  [phone]  Physical Therapy Treatment  Patient Details  Name: Sandra Mann MRN: [id number] Date of Birth: Jan 08, 1951 Referring Provider (PT): Supple   Encounter Date: 10/19/2021   PT End of Session - 10/19/21 0900     Visit Number 7    Number of Visits 16    Date for PT Re-Evaluation 12/11/21    PT Start Time 0819    PT Stop Time 0905    PT Time Calculation (min) 46 min    Activity Tolerance Patient tolerated treatment well    Behavior During Therapy [facility] for tasks assessed/performed             Past Medical History:  Diagnosis Date   Chest pain    GERD (gastroesophageal reflux disease)    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   Oral mass    left   Osteopenia    PONV (postoperative nausea and vomiting)    Rheumatoid arteritis ([place])    Rheumatoid arthritis ([place])    Ulcerative proctitis ([place])    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   Wears glasses     Past Surgical History:  Procedure Laterality Date   COLONOSCOPY     CYSTOSCOPY WITH INJECTION N/A 03/04/2021   Procedure: CYSTOSCOPY WITH INJECTION OF INDOCYANINE GREEN DYE;  Surgeon: Alexis Frock, MD;  [facility];  Service: Urology;  Laterality: N/A;   CYSTOSCOPY WITH URETHRAL DILATATION N/A 09/23/2020   Procedure: CYSTOSCOPY WITH URETHRAL DILATATION, EXAM UNDER ANESTHESIA, BIOPSY OF URETHRAL MASS;  Surgeon: Robley Fries, MD;  [facility];  Service: Urology;  Laterality: N/A;   EXCISION ORAL TUMOR Left 03/27/2014   Procedure: EXCISION OF ORAL CANCER;  Surgeon: Izora Gala, MD;  Location: [place];  Service: ENT;  Laterality: Left;   FINGER GANGLION CYST EXCISION Right 2007   small dip   INCISION AND DRAINAGE ABSCESS Left 06/12/2014   Procedure: LEFT INCISION AND DRAINAGE MANDIBULAR ABSCESS;  Surgeon: Izora Gala, MD;  Location: [place];   Service: ENT;  Laterality: Left;   INCISION AND DRAINAGE ABSCESS Left 11/22/2014   Procedure: INCISION AND DRAINAGE LEFT  MANDIBULAR ABSCESS;  Surgeon: Izora Gala, MD;  Location: [place];  Service: ENT;  Laterality: Left;   INCISION AND DRAINAGE OF PERITONSILLAR ABCESS Left 06/01/2014   Procedure: INCISION AND DRAINAGE Masticator Space Infection;  Surgeon: Izora Gala, MD;  Location: [place];  Service: ENT;  Laterality: Left;   IR IMAGING GUIDED PORT INSERTION  10/21/2020   LYMPH NODE DISSECTION Bilateral 03/04/2021   Procedure: LYMPH NODE DISSECTION;  Surgeon: Alexis Frock, MD;  [facility];  Service: Urology;  Laterality: Bilateral;   MULTIPLE EXTRACTIONS WITH ALVEOLOPLASTY Left 03/27/2014   Procedure: EXTRACTIONS OF NECESSARY TOOTH ;  Surgeon: Ceasar Mons, DDS;  Location: [place];  Service: Oral Surgery;  Laterality: Left;   REVERSE SHOULDER ARTHROPLASTY Left 09/03/2021   Procedure: REVERSE SHOULDER ARTHROPLASTY;  Surgeon: Justice Britain, MD;  [facility];  Service: Orthopedics;  Laterality: Left;   ROBOT ASSISTED LAPAROSCOPIC COMPLETE CYSTECT ILEAL CONDUIT N/A 03/04/2021   Procedure: XI ROBOTIC ASSISTED LAPAROSCOPIC COMPLETE CYSTECT ILEAL CONDUIT;  Surgeon: Alexis Frock, MD;  [facility];  Service: Urology;  Laterality: N/A;  6.5 HRS   ROBOTIC ASSISTED LAPAROSCOPIC HYSTERECTOMY AND SALPINGECTOMY Bilateral 03/04/2021   Procedure: XI ROBOTIC ASSISTED LAPAROSCOPIC HYSTERECTOMY AND SALPINGECTOMY;  Surgeon:  Alexis Frock, MD;  [facility];  Service: Urology;  Laterality: Bilateral;   TUBAL LIGATION  ~ 1988    There were no vitals filed for this visit.   Subjective Assessment - 10/19/21 0858     Subjective COVID-19 screen performed prior to patient entering clinic. Had some pain earlier but took muscle spasm medication and it helped.    Pertinent History RA and history of cancer    Limitations Lifting    Patient Stated Goals reach overhead, wash under her  other arm, buckle her seatbelt    Currently in Pain? No/denies                [facility] PT Assessment - 10/19/21 0001       Assessment   Medical Diagnosis Left RTSA    Referring Provider (PT) Supple    Onset Date/Surgical Date 09/03/21    Hand Dominance Right    Next MD Visit 11/23/2021    Prior Therapy No      Precautions   Precautions Shoulder    Type of Shoulder Precautions Reverse TSA                           [facility] Adult PT Treatment/Exercise - 10/19/21 0001       Shoulder Exercises: Seated   External Rotation AAROM;Left;20 reps    Flexion AAROM;Left;20 reps      Shoulder Exercises: Pulleys   Flexion 5 minutes      Shoulder Exercises: ROM/Strengthening   UBE (Upper Arm Bike) 120 RPM x4 nin (forward/backward)    Ranger seated; flex/ext x3 min, circles x10 reps      Shoulder Exercises: Isometric Strengthening   Flexion 5X5"    Extension 5X5"    External Rotation 5X5"    Internal Rotation 5X5"      Modalities   Modalities Vasopneumatic      Vasopneumatic   Number Minutes Vasopneumatic  10 minutes    Vasopnuematic Location  Shoulder    Vasopneumatic Pressure Low    Vasopneumatic Temperature  34      Manual Therapy   Manual Therapy Passive ROM    Passive ROM PROM into left shoulder flex/ER/IR reclined with gentle holds at end range                          PT Long Term Goals - 09/28/21 1219       PT LONG TERM GOAL #1   Title Patient will be independent with her HEP.    Time 8    Period Weeks    Status New    Target Date 11/23/21      PT LONG TERM GOAL #2   Title Patient will be able to demonstrate at least 120 degrees of active shoulder flexion with her left UE.    Time 8    Period Weeks    Status New    Target Date 11/23/21      PT LONG TERM GOAL #3   Title Patient will be able to demonstrate at least 100 degrees of left shoulder abduction for improved shoulder moiblity.    Time 8    Period Weeks    Status  New    Target Date 11/23/21      PT LONG TERM GOAL #4   Title Patient will able to buckle her seatbelt with her left UE for improved safety.    Baseline unable  Time 8    Period Weeks    Status New    Target Date 11/23/21                   Plan - 10/19/21 0933     Clinical Impression Statement Patient presented in clinic with reports of no current L shoulder pain. Patient continued through [facility] exercises and introduced to light isometrics exercises with no reports of pain. More discomfort reported with PROM ER and end range AAROM ER. Firm end feels and smooth arc of motion noted during PROM of L shoulder. Normal vasopneumatic response noted following removal of the modality.    Personal Factors and Comorbidities Comorbidity 1;Comorbidity 2    Comorbidities RA, history of cancer,    Examination-Activity Limitations Bathing;Carry;Reach Overhead    Examination-Participation Restrictions Cleaning    Stability/Clinical Decision Making Evolving/Moderate complexity    Rehab Potential Good    PT Frequency 2x / week    PT Duration 8 weeks    PT Treatment/Interventions Cryotherapy;Electrical Stimulation;Moist Heat;Neuromuscular re-education;Therapeutic exercise;Therapeutic activities;Patient/family education;Manual techniques;Passive range of motion;Taping;Vasopneumatic Device    PT Next Visit Plan Review HEP, progress per protocol, continue with left shoulder PROM.  Pulleys and UE Ranger.    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

## 2021-10-21 ENCOUNTER — Other Ambulatory Visit: Payer: Self-pay

## 2021-10-21 ENCOUNTER — Ambulatory Visit: Payer: Medicare Other | Admitting: Physical Therapy

## 2021-10-21 DIAGNOSIS — M25512 Pain in left shoulder: Secondary | ICD-10-CM

## 2021-10-21 DIAGNOSIS — M25612 Stiffness of left shoulder, not elsewhere classified: Secondary | ICD-10-CM

## 2021-10-21 NOTE — Therapy (Signed)
[facility] [place], [place], [address] Phone: [phone]   Fax:  [phone]  Physical Therapy Treatment  Patient Details  Name: Sandra Mann MRN: [id number] Date of Birth: 1951/10/11 Referring Provider (PT): Supple   Encounter Date: 10/21/2021   PT End of Session - 10/21/21 0950     Visit Number 8    Number of Visits 16    Date for PT Re-Evaluation 12/11/21    PT Start Time 0816    PT Stop Time 0904    PT Time Calculation (min) 48 min    Activity Tolerance Patient tolerated treatment well    Behavior During Therapy [facility] for tasks assessed/performed             Past Medical History:  Diagnosis Date   Chest pain    GERD (gastroesophageal reflux disease)    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   Oral mass    left   Osteopenia    PONV (postoperative nausea and vomiting)    Rheumatoid arteritis ([place])    Rheumatoid arthritis ([place])    Ulcerative proctitis ([place])    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   Wears glasses     Past Surgical History:  Procedure Laterality Date   COLONOSCOPY     CYSTOSCOPY WITH INJECTION N/A 03/04/2021   Procedure: CYSTOSCOPY WITH INJECTION OF INDOCYANINE GREEN DYE;  Surgeon: Alexis Frock, MD;  [facility];  Service: Urology;  Laterality: N/A;   CYSTOSCOPY WITH URETHRAL DILATATION N/A 09/23/2020   Procedure: CYSTOSCOPY WITH URETHRAL DILATATION, EXAM UNDER ANESTHESIA, BIOPSY OF URETHRAL MASS;  Surgeon: Robley Fries, MD;  [facility];  Service: Urology;  Laterality: N/A;   EXCISION ORAL TUMOR Left 03/27/2014   Procedure: EXCISION OF ORAL CANCER;  Surgeon: Izora Gala, MD;  Location: [place];  Service: ENT;  Laterality: Left;   FINGER GANGLION CYST EXCISION Right 2007   small dip   INCISION AND DRAINAGE ABSCESS Left 06/12/2014   Procedure: LEFT INCISION AND DRAINAGE MANDIBULAR ABSCESS;  Surgeon: Izora Gala, MD;  Location: [place];   Service: ENT;  Laterality: Left;   INCISION AND DRAINAGE ABSCESS Left 11/22/2014   Procedure: INCISION AND DRAINAGE LEFT  MANDIBULAR ABSCESS;  Surgeon: Izora Gala, MD;  Location: [place];  Service: ENT;  Laterality: Left;   INCISION AND DRAINAGE OF PERITONSILLAR ABCESS Left 06/01/2014   Procedure: INCISION AND DRAINAGE Masticator Space Infection;  Surgeon: Izora Gala, MD;  Location: [place];  Service: ENT;  Laterality: Left;   IR IMAGING GUIDED PORT INSERTION  10/21/2020   LYMPH NODE DISSECTION Bilateral 03/04/2021   Procedure: LYMPH NODE DISSECTION;  Surgeon: Alexis Frock, MD;  [facility];  Service: Urology;  Laterality: Bilateral;   MULTIPLE EXTRACTIONS WITH ALVEOLOPLASTY Left 03/27/2014   Procedure: EXTRACTIONS OF NECESSARY TOOTH ;  Surgeon: Ceasar Mons, DDS;  Location: [place];  Service: Oral Surgery;  Laterality: Left;   REVERSE SHOULDER ARTHROPLASTY Left 09/03/2021   Procedure: REVERSE SHOULDER ARTHROPLASTY;  Surgeon: Justice Britain, MD;  [facility];  Service: Orthopedics;  Laterality: Left;   ROBOT ASSISTED LAPAROSCOPIC COMPLETE CYSTECT ILEAL CONDUIT N/A 03/04/2021   Procedure: XI ROBOTIC ASSISTED LAPAROSCOPIC COMPLETE CYSTECT ILEAL CONDUIT;  Surgeon: Alexis Frock, MD;  [facility];  Service: Urology;  Laterality: N/A;  6.5 HRS   ROBOTIC ASSISTED LAPAROSCOPIC HYSTERECTOMY AND SALPINGECTOMY Bilateral 03/04/2021   Procedure: XI ROBOTIC ASSISTED LAPAROSCOPIC HYSTERECTOMY AND SALPINGECTOMY;  Surgeon:  Alexis Frock, MD;  [facility];  Service: Urology;  Laterality: Bilateral;   TUBAL LIGATION  ~ 1988    There were no vitals filed for this visit.   Subjective Assessment - 10/21/21 0950     Subjective COVID-19 screen performed prior to patient entering clinic.  Doing okay.    Pertinent History RA and history of cancer    Limitations Lifting    Patient Stated Goals reach overhead, wash under her other arm, buckle her seatbelt    Currently in Pain? Yes     Pain Score 4     Pain Orientation Left    Pain Descriptors / Indicators Aching    Pain Onset More than a month ago                               [facility] Adult PT Treatment/Exercise - 10/21/21 0001       Shoulder Exercises: Standing   Other Standing Exercises Wall ladder x 4 minutes f/b UE Ranger on wall x 4 minutes.      Shoulder Exercises: Pulleys   Flexion 5 minutes      Modalities   Modalities Electrical Stimulation;Vasopneumatic      Electrical Stimulation   Electrical Stimulation Location Left anterior shoulder.    Electrical Stimulation Action Pre-mod.    Electrical Stimulation Parameters 80-150 Hz.x 15 minutes.    Electrical Stimulation Goals Pain      Vasopneumatic   Number Minutes Vasopneumatic  15 minutes    Vasopnuematic Location  --   Left shoulder with pillow between thorax and elbow.     Manual Therapy   Manual Therapy Passive ROM    Passive ROM In supine:  PROM x 10 minutes to patient's left shoulder.                     PT Education - 10/21/21 0955     Education Details Instructed patient in wall climbs and corner stretch.    Person(s) Educated Patient    Methods Explanation;Demonstration;Handout    Comprehension Returned demonstration;Verbalized understanding                 PT Long Term Goals - 09/28/21 1219       PT LONG TERM GOAL #1   Title Patient will be independent with her HEP.    Time 8    Period Weeks    Status New    Target Date 11/23/21      PT LONG TERM GOAL #2   Title Patient will be able to demonstrate at least 120 degrees of active shoulder flexion with her left UE.    Time 8    Period Weeks    Status New    Target Date 11/23/21      PT LONG TERM GOAL #3   Title Patient will be able to demonstrate at least 100 degrees of left shoulder abduction for improved shoulder moiblity.    Time 8    Period Weeks    Status New    Target Date 11/23/21      PT LONG TERM GOAL #4   Title  Patient will able to buckle her seatbelt with her left UE for improved safety.    Baseline unable    Time 8    Period Weeks    Status New    Target Date 11/23/21  Plan - 10/21/21 0955     Clinical Impression Statement Patient is doing very well.  Progressed to standing UE Ranger and added wall climbbs and corner stretch to her HEP which she performed with excellent technique and found them to be very helpful.    Personal Factors and Comorbidities Comorbidity 1;Comorbidity 2    Comorbidities RA, history of cancer,    Examination-Activity Limitations Bathing;Carry;Reach Overhead    Examination-Participation Restrictions Cleaning    Rehab Potential Good    PT Frequency 2x / week    PT Treatment/Interventions Cryotherapy;Electrical Stimulation;Moist Heat;Neuromuscular re-education;Therapeutic exercise;Therapeutic activities;Patient/family education;Manual techniques;Passive range of motion;Taping;Vasopneumatic Device    PT Next Visit Plan Review HEP, progress per protocol, continue with left shoulder PROM.  Pulleys and UE Ranger, wall ladder.    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

## 2021-10-23 ENCOUNTER — Other Ambulatory Visit: Payer: Self-pay

## 2021-10-23 ENCOUNTER — Ambulatory Visit (HOSPITAL_COMMUNITY)
Admission: RE | Admit: 2021-10-23 | Discharge: 2021-10-23 | Disposition: A | Discharge status: Home / Self Care | Payer: Medicare Other | Source: Ambulatory Visit | Attending: Oncology | Admitting: Oncology

## 2021-10-23 DIAGNOSIS — C680 Malignant neoplasm of urethra: Secondary | ICD-10-CM

## 2021-10-23 MED ORDER — SODIUM CHLORIDE (PF) 0.9 % IJ SOLN
INTRAMUSCULAR | Status: AC
  Filled 2021-10-23: qty 50

## 2021-10-23 MED ORDER — IOHEXOL 350 MG/ML SOLN
80.0000 mL | Freq: Once | INTRAVENOUS | Status: AC | PRN
  Administered 2021-10-23: 09:00:00 75 mL via INTRAVENOUS

## 2021-11-03 ENCOUNTER — Other Ambulatory Visit: Payer: Self-pay

## 2021-11-03 ENCOUNTER — Ambulatory Visit: Payer: Medicare Other | Attending: Orthopedic Surgery | Admitting: Physical Therapy

## 2021-11-03 DIAGNOSIS — M25612 Stiffness of left shoulder, not elsewhere classified: Secondary | ICD-10-CM | POA: Diagnosis present

## 2021-11-03 DIAGNOSIS — M25512 Pain in left shoulder: Secondary | ICD-10-CM | POA: Diagnosis present

## 2021-11-03 NOTE — Therapy (Signed)
[facility] [place], [place], [address] Phone: [phone]   Fax:  [phone]  Physical Therapy Treatment  Patient Details  Name: Sandra Mann MRN: [id number] Date of Birth: 1951-05-05 Referring Provider (PT): Supple   Encounter Date: 11/03/2021   PT End of Session - 11/03/21 0936     Visit Number 9    Number of Visits 16    Date for PT Re-Evaluation 12/11/21    PT Start Time 0900    PT Stop Time 0945    PT Time Calculation (min) 45 min    Activity Tolerance Patient tolerated treatment well    Behavior During Therapy [facility] for tasks assessed/performed             Past Medical History:  Diagnosis Date   Chest pain    GERD (gastroesophageal reflux disease)    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   Oral mass    left   Osteopenia    PONV (postoperative nausea and vomiting)    Rheumatoid arteritis ([place])    Rheumatoid arthritis ([place])    Ulcerative proctitis ([place])    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   Wears glasses     Past Surgical History:  Procedure Laterality Date   COLONOSCOPY     CYSTOSCOPY WITH INJECTION N/A 03/04/2021   Procedure: CYSTOSCOPY WITH INJECTION OF INDOCYANINE GREEN DYE;  Surgeon: Alexis Frock, MD;  [facility];  Service: Urology;  Laterality: N/A;   CYSTOSCOPY WITH URETHRAL DILATATION N/A 09/23/2020   Procedure: CYSTOSCOPY WITH URETHRAL DILATATION, EXAM UNDER ANESTHESIA, BIOPSY OF URETHRAL MASS;  Surgeon: Robley Fries, MD;  [facility];  Service: Urology;  Laterality: N/A;   EXCISION ORAL TUMOR Left 03/27/2014   Procedure: EXCISION OF ORAL CANCER;  Surgeon: Izora Gala, MD;  Location: [place];  Service: ENT;  Laterality: Left;   FINGER GANGLION CYST EXCISION Right 2007   small dip   INCISION AND DRAINAGE ABSCESS Left 06/12/2014   Procedure: LEFT INCISION AND DRAINAGE MANDIBULAR ABSCESS;  Surgeon: Izora Gala, MD;  Location: [place];   Service: ENT;  Laterality: Left;   INCISION AND DRAINAGE ABSCESS Left 11/22/2014   Procedure: INCISION AND DRAINAGE LEFT  MANDIBULAR ABSCESS;  Surgeon: Izora Gala, MD;  Location: [place];  Service: ENT;  Laterality: Left;   INCISION AND DRAINAGE OF PERITONSILLAR ABCESS Left 06/01/2014   Procedure: INCISION AND DRAINAGE Masticator Space Infection;  Surgeon: Izora Gala, MD;  Location: [place];  Service: ENT;  Laterality: Left;   IR IMAGING GUIDED PORT INSERTION  10/21/2020   LYMPH NODE DISSECTION Bilateral 03/04/2021   Procedure: LYMPH NODE DISSECTION;  Surgeon: Alexis Frock, MD;  [facility];  Service: Urology;  Laterality: Bilateral;   MULTIPLE EXTRACTIONS WITH ALVEOLOPLASTY Left 03/27/2014   Procedure: EXTRACTIONS OF NECESSARY TOOTH ;  Surgeon: Ceasar Mons, DDS;  Location: [place];  Service: Oral Surgery;  Laterality: Left;   REVERSE SHOULDER ARTHROPLASTY Left 09/03/2021   Procedure: REVERSE SHOULDER ARTHROPLASTY;  Surgeon: Justice Britain, MD;  [facility];  Service: Orthopedics;  Laterality: Left;   ROBOT ASSISTED LAPAROSCOPIC COMPLETE CYSTECT ILEAL CONDUIT N/A 03/04/2021   Procedure: XI ROBOTIC ASSISTED LAPAROSCOPIC COMPLETE CYSTECT ILEAL CONDUIT;  Surgeon: Alexis Frock, MD;  [facility];  Service: Urology;  Laterality: N/A;  6.5 HRS   ROBOTIC ASSISTED LAPAROSCOPIC HYSTERECTOMY AND SALPINGECTOMY Bilateral 03/04/2021   Procedure: XI ROBOTIC ASSISTED LAPAROSCOPIC HYSTERECTOMY AND SALPINGECTOMY;  Surgeon:  Alexis Frock, MD;  [facility];  Service: Urology;  Laterality: Bilateral;   TUBAL LIGATION  ~ 1988    There were no vitals filed for this visit.   Subjective Assessment - 11/03/21 0936     Subjective COVID-19 screen performed prior to patient entering clinic. Did HEP on vacation.    Pertinent History RA and history of cancer    Limitations Lifting    Patient Stated Goals reach overhead, wash under her other arm, buckle her seatbelt    Currently in  Pain? Yes    Pain Score 3     Pain Location Shoulder    Pain Orientation Left    Pain Descriptors / Indicators Aching    Pain Onset More than a month ago                [facility] PT Assessment - 11/03/21 0001       AROM   Overall AROM Comments In supine:  Left shoulder flexion to 135 degrees and ER to 55 degrees.                           [facility] Adult PT Treatment/Exercise - 11/03/21 0001       Exercises   Exercises Shoulder      Shoulder Exercises: Standing   Other Standing Exercises Wall slides x 2 minutes.      Shoulder Exercises: Pulleys   Flexion 5 minutes      Shoulder Exercises: ROM/Strengthening   UBE (Upper Arm Bike) 120 RPM's x 8 minutes (4 minutes forward and 4 minutes backward).      Vasopneumatic   Number Minutes Vasopneumatic  15 minutes    Vasopnuematic Location  --   Left shoulder with pillow between thorax and left elbow.   Vasopneumatic Pressure Low      Manual Therapy   Manual Therapy Passive ROM    Passive ROM In supine:  PROM x 8 minutes to patient's left shoulder into flex, abd and ER x 8 minutes.                          PT Long Term Goals - 09/28/21 1219       PT LONG TERM GOAL #1   Title Patient will be independent with her HEP.    Time 8    Period Weeks    Status New    Target Date 11/23/21      PT LONG TERM GOAL #2   Title Patient will be able to demonstrate at least 120 degrees of active shoulder flexion with her left UE.    Time 8    Period Weeks    Status New    Target Date 11/23/21      PT LONG TERM GOAL #3   Title Patient will be able to demonstrate at least 100 degrees of left shoulder abduction for improved shoulder moiblity.    Time 8    Period Weeks    Status New    Target Date 11/23/21      PT LONG TERM GOAL #4   Title Patient will able to buckle her seatbelt with her left UE for improved safety.    Baseline unable    Time 8    Period Weeks    Status New    Target Date  11/23/21  Plan - 11/03/21 0942     Clinical Impression Statement Patient doing very well and was compliant to her HEP while on vacation.  Her active left shoulder flexion in supine is 135 degrees and ER is 55 degrees.    Personal Factors and Comorbidities Comorbidity 1;Comorbidity 2    Comorbidities RA, history of cancer,    Examination-Activity Limitations Bathing;Carry;Reach Overhead    Examination-Participation Restrictions Cleaning    Rehab Potential Good    PT Frequency 2x / week    PT Duration 8 weeks    PT Next Visit Plan Review HEP, progress per protocol, continue with left shoulder PROM.  Pulleys and UE Ranger, wall ladder, ball on wall.    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

## 2021-11-05 ENCOUNTER — Inpatient Hospital Stay: Payer: Medicare Other | Admitting: Oncology

## 2021-11-05 ENCOUNTER — Inpatient Hospital Stay: Payer: Medicare Other | Attending: Oncology

## 2021-11-05 ENCOUNTER — Other Ambulatory Visit: Payer: Self-pay

## 2021-11-05 ENCOUNTER — Inpatient Hospital Stay: Payer: Medicare Other

## 2021-11-05 VITALS — BP 126/65 | HR 83 | Temp 97.6°F | Resp 17 | Wt 116.6 lb

## 2021-11-05 DIAGNOSIS — Z5112 Encounter for antineoplastic immunotherapy: Secondary | ICD-10-CM | POA: Insufficient documentation

## 2021-11-05 DIAGNOSIS — Z95828 Presence of other vascular implants and grafts: Secondary | ICD-10-CM

## 2021-11-05 DIAGNOSIS — C680 Malignant neoplasm of urethra: Secondary | ICD-10-CM

## 2021-11-05 DIAGNOSIS — Z79899 Other long term (current) drug therapy: Secondary | ICD-10-CM | POA: Insufficient documentation

## 2021-11-05 LAB — CMP (CANCER CENTER ONLY)
ALT: 20 U/L (ref 0–44)
AST: 34 U/L (ref 15–41)
Albumin: 4.3 g/dL (ref 3.5–5.0)
Alkaline Phosphatase: 74 U/L (ref 38–126)
Anion gap: 8 (ref 5–15)
BUN: 17 mg/dL (ref 8–23)
CO2: 24 mmol/L (ref 22–32)
Calcium: 9.3 mg/dL (ref 8.9–10.3)
Chloride: 103 mmol/L (ref 98–111)
Creatinine: 0.89 mg/dL (ref 0.44–1.00)
GFR, Estimated: >60 mL/min (ref >60)
Glucose, Bld: 90 mg/dL (ref 70–99)
Potassium: 4.1 mmol/L (ref 3.5–5.1)
Sodium: 135 mmol/L (ref 135–145)
Total Bilirubin: 0.6 mg/dL (ref 0.3–1.2)
Total Protein: 7.2 g/dL (ref 6.5–8.1)

## 2021-11-05 LAB — CBC WITH DIFFERENTIAL (CANCER CENTER ONLY)
Abs Immature Granulocytes: 0.02 10*3/uL (ref 0.00–0.07)
Basophils Absolute: 0 10*3/uL (ref 0.0–0.1)
Basophils Relative: 1 %
Eosinophils Absolute: 0.1 10*3/uL (ref 0.0–0.5)
Eosinophils Relative: 1 %
HCT: 34.6 % — ABNORMAL LOW (ref 36.0–46.0)
Hemoglobin: 12 g/dL (ref 12.0–15.0)
Immature Granulocytes: 0 %
Lymphocytes Relative: 17 %
Lymphs Abs: 1.1 10*3/uL (ref 0.7–4.0)
MCH: 33.1 pg (ref 26.0–34.0)
MCHC: 34.7 g/dL (ref 30.0–36.0)
MCV: 95.6 fL (ref 80.0–100.0)
Monocytes Absolute: 0.7 10*3/uL (ref 0.1–1.0)
Monocytes Relative: 10 %
Neutro Abs: 4.8 10*3/uL (ref 1.7–7.7)
Neutrophils Relative %: 71 %
Platelet Count: 215 10*3/uL (ref 150–400)
RBC: 3.62 MIL/uL — ABNORMAL LOW (ref 3.87–5.11)
RDW: 14.2 % (ref 11.5–15.5)
WBC Count: 6.7 10*3/uL (ref 4.0–10.5)
nRBC: 0 % (ref 0.0–0.2)

## 2021-11-05 LAB — TSH: TSH: 1.03 u[IU]/mL (ref 0.308–3.960)

## 2021-11-05 MED ORDER — SODIUM CHLORIDE 0.9% FLUSH
10.0000 mL | Freq: Once | INTRAVENOUS | Status: AC
  Administered 2021-11-05: 10 mL

## 2021-11-05 MED ORDER — SODIUM CHLORIDE 0.9 % IV SOLN: Freq: Once | INTRAVENOUS | Status: AC

## 2021-11-05 MED ORDER — SODIUM CHLORIDE 0.9 % IV SOLN
480.0000 mg | Freq: Once | INTRAVENOUS | Status: AC
  Administered 2021-11-05: 480 mg via INTRAVENOUS
  Filled 2021-11-05: qty 48

## 2021-11-05 MED ORDER — HEPARIN SOD (PORK) LOCK FLUSH 100 UNIT/ML IV SOLN
500.0000 [IU] | Freq: Once | INTRAVENOUS | Status: AC | PRN
  Administered 2021-11-05: 500 [IU]

## 2021-11-05 MED ORDER — SODIUM CHLORIDE 0.9% FLUSH
10.0000 mL | INTRAVENOUS | Status: DC | PRN
  Administered 2021-11-05: 10 mL

## 2021-11-05 MED ORDER — VALACYCLOVIR HCL 500 MG PO TABS: 500.0000 mg | ORAL_TABLET | Freq: Every day | ORAL | 1 refills | Status: DC

## 2021-11-05 NOTE — Progress Notes (Signed)
Hematology and Oncology Follow Up Visit  Sandra Mann [id number] February 22, 1951 71 y.o. 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   Prior Therapy: She is status post cystoscopy and biopsy in November 2021 which showed high-grade urothelial carcinoma. Neoadjuvant chemotherapy utilizing gemcitabine and cisplatin started on October 29, 2020.  She completed 4 cycles of therapy on January 07, 2021. She is status post robotic assisted laparoscopic cystectomy, hysterectomy and lymph node dissection completed on Mar 04, 2021 completed by Dr. Tammi Klippel.  He final pathology showed T4N1 disease.  She had 1 out of 16 lymph node involvement.  Current therapy: Nivolumab 480 mg every 4 weeks started in July 2022.  She is here for cycle 6 of therapy.    Interim History: [name] is here for a follow-up visit.  Since the last visit, she reports no major changes in her health.  She denies any recent hospitalizations or illnesses.  She continues to tolerate nivolumab without any recent complaints.  She does have occasional blisters on her arms and lips that are manageable with topical creams.  She denies any shortness of breath or difficulty breathing.  She denies any worsening diarrhea.  She does have occasional constipation.  Medications: Updated on review. Current Outpatient Medications  Medication Sig Dispense Refill   acetaminophen (TYLENOL) 500 MG tablet Take 500 mg by mouth every 6 (six) hours as needed for moderate pain.     cyclobenzaprine (FLEXERIL) 10 MG tablet Take 1 tablet (10 mg total) by mouth 3 (three) times daily as needed for muscle spasms. 30 tablet 1   cycloSPORINE (RESTASIS) 0.05 % ophthalmic emulsion Place 1 drop into both eyes 2 (two) times daily.     folic acid (FOLVITE) 706 MCG tablet Take 1,600 mcg by mouth daily.       lidocaine-prilocaine (EMLA) cream Apply 1 application topically as needed. (Patient taking differently: Apply 1 application topically as needed (for the port).) 30 g 0   Melatonin 10 MG TABS Take 10 mg by mouth at bedtime.     mesalamine (CANASA) 1000 MG suppository Place 1,000 mg rectally every other day. At bedtime     methotrexate (RHEUMATREX) 2.5 MG tablet Take 20 mg by mouth every Tuesday.     Multiple Vitamin (MULTIVITAMIN WITH MINERALS) TABS tablet Take 1 tablet by mouth at bedtime. Silver     naproxen (NAPROSYN) 500 MG tablet Take 1 tablet (500 mg total) by mouth 2 (two) times daily with a meal. 60 tablet 1   omeprazole (PRILOSEC) 40 MG capsule Take 40 mg by mouth daily.     ondansetron (ZOFRAN) 4 MG tablet Take 1 tablet (4 mg total) by mouth every 8 (eight) hours as needed for nausea or vomiting. (Patient not taking: Reported on 09/28/2021) 10 tablet 0   oxyCODONE-acetaminophen (PERCOCET) 5-325 MG tablet Take 1 tablet by mouth every 4 (four) hours as needed (max 6 q). (Patient not taking: Reported on 09/28/2021) 20 tablet 0   traMADol (ULTRAM) 50 MG tablet Take 1-2 tablets (50-100 mg total) by mouth every 6 (six) hours as needed for moderate pain. (Patient not taking: Reported on 09/28/2021) 20 tablet 0   No current facility-administered medications for this visit.     Allergies:  Allergies  Allergen Reactions   Codeine Nausea And Vomiting   Sulfa Antibiotics Nausea And Vomiting   Vicodin [Hydrocodone-Acetaminophen]  Nausea And Vomiting      Physical Exam:     Blood pressure 126/65, pulse 83, temperature 97.6 F (36.4 C), temperature source Tympanic, resp. rate 17, weight 116 lb 9 oz (52.9 kg), SpO2 100 %.      ECOG: 0    General appearance: Comfortable appearing without any discomfort Head: Normocephalic without any trauma Oropharynx: Mucous membranes are moist and pink without any thrush or ulcers. Eyes: Pupils are equal and round reactive to light. Lymph nodes:  No cervical, supraclavicular, inguinal or axillary lymphadenopathy.   Heart:regular rate and rhythm.  S1 and S2 without leg edema. Lung: Clear without any rhonchi or wheezes.  No dullness to percussion. Abdomin: Soft, nontender, nondistended with good bowel sounds.  No hepatosplenomegaly. Musculoskeletal: No joint deformity or effusion.  Full range of motion noted. Neurological: No deficits noted on motor, sensory and deep tendon reflex exam. Skin: Raised lesion noted on her right upper arm.  Mild erythema associated with it.             Lab Results: Lab Results  Component Value Date   WBC 7.3 10/08/2021   HGB 11.2 (L) 10/08/2021   HCT 32.6 (L) 10/08/2021   MCV 95.0 10/08/2021   PLT 260 10/08/2021     Chemistry      Component Value Date/Time   NA 140 10/08/2021 1113   K 3.9 10/08/2021 1113   CL 106 10/08/2021 1113   CO2 23 10/08/2021 1113   BUN 13 10/08/2021 1113   CREATININE 0.78 10/08/2021 1113      Component Value Date/Time   CALCIUM 8.9 10/08/2021 1113   ALKPHOS 88 10/08/2021 1113   AST 37 10/08/2021 1113   ALT 26 10/08/2021 1113   BILITOT 0.8 10/08/2021 1113      IMPRESSION: 1. Stable exam. No new or progressive findings to suggest recurrent or metastatic disease in the chest, abdomen, or pelvis. 2. Status post cystectomy with right lower quadrant ileal diversion. 3. Stable 8 mm short axis right external iliac node. 4. Aortic Atherosclerosis (ICD10-I70.0).  Impression and Plan:  71 year old woman with:   1.  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  Risks and benefits of continuing nivolumab adjuvant therapy were reviewed at this time.  CT scan images obtained on October 23, 2021 were reviewed and showed no evidence of metastatic disease.  At this time I recommended continuing the same dose and schedule.  We will repeat imaging studies in 4 to 6 months.  She is agreeable to proceed at this time.   2.  IV access:  Port-A-Cath currently in use without any issues.   3.  Antiemetics: No nausea or vomiting reported at this time.  Compazine is available to her.   4.  Immune mediated complications: She has not experienced any autoimmune issues.  These include pneumonitis, colitis and thyroid disease.   5.  Goals of care: Therapy remains curative at this time with aggressive measures warranted.    6.  Follow-up: In 4 weeks for the next cycle of therapy.   30  minutes were dedicated to this encounter.  The time was spent on reviewing laboratory data, disease status update and outlining future plan of care.  [name], MD 1/5/202310:57 AM

## 2021-11-06 ENCOUNTER — Telehealth: Payer: Self-pay | Admitting: Oncology

## 2021-11-06 ENCOUNTER — Other Ambulatory Visit: Payer: Self-pay

## 2021-11-06 ENCOUNTER — Encounter: Payer: Self-pay | Admitting: Physical Therapy

## 2021-11-06 ENCOUNTER — Ambulatory Visit: Payer: Medicare Other | Admitting: Physical Therapy

## 2021-11-06 NOTE — Telephone Encounter (Signed)
Scheduled per 01/05 los, patient has been called and voicemail was left.

## 2021-11-06 NOTE — Therapy (Signed)
[facility] [place], [place], [address] Phone: [phone]   Fax:  [phone]  Patient Details  Name: Sandra Mann MRN: [id number] Date of Birth: January 14, 1951 Referring Provider:  [name], MD  Encounter Date: 11/06/2021  Patient presented in clinic with more L shoulder pain that started spontaneously yesterday. Patient reported more pain during PT session on 11/03/2021. Patient states that she went to immunotherapy yesterday and when trying to park her large truck the shooting pains began. Patient unable to do HEP last night once the sharp, shooting pains began which are rated as 10/10. Upon arrival to clinic, a 6 cm x 6 cm firm area was palpable just lateral to the L shoulder incision and was tender intermittantly. The top of the nodule was 3 cm below the tip of the acromian. Patient able to demonstrate WNL L shoulder flexion but arrived with 5/10 L shoulder constant pain. Patient's L shoulder was reviewed by PT staff in which she was encouraged to contact surgeon's office to get an earlier appointment to be evaluated just in case.  Standley Brooking, PTA 11/06/2021, 9:35 AM  [facility] [address] [place], [place], [address] Phone: [phone]   Fax:  [phone]

## 2021-11-09 ENCOUNTER — Encounter: Payer: Medicare Other | Admitting: Physical Therapy

## 2021-11-10 ENCOUNTER — Ambulatory Visit: Payer: Medicare Other | Admitting: Physical Therapy

## 2021-11-12 ENCOUNTER — Encounter: Payer: Medicare Other | Admitting: Physical Therapy

## 2021-12-03 ENCOUNTER — Inpatient Hospital Stay: Payer: Medicare Other | Attending: Oncology

## 2021-12-03 ENCOUNTER — Inpatient Hospital Stay: Payer: Medicare Other

## 2021-12-03 ENCOUNTER — Inpatient Hospital Stay: Payer: Medicare Other | Admitting: Oncology

## 2021-12-03 ENCOUNTER — Other Ambulatory Visit: Payer: Self-pay

## 2021-12-03 VITALS — BP 130/64 | HR 79 | Temp 97.9°F | Resp 15 | Ht 63.5 in | Wt 115.2 lb

## 2021-12-03 DIAGNOSIS — Z95828 Presence of other vascular implants and grafts: Secondary | ICD-10-CM | POA: Diagnosis not present

## 2021-12-03 DIAGNOSIS — Z5112 Encounter for antineoplastic immunotherapy: Secondary | ICD-10-CM | POA: Diagnosis not present

## 2021-12-03 DIAGNOSIS — Z79899 Other long term (current) drug therapy: Secondary | ICD-10-CM | POA: Insufficient documentation

## 2021-12-03 DIAGNOSIS — C680 Malignant neoplasm of urethra: Secondary | ICD-10-CM | POA: Insufficient documentation

## 2021-12-03 LAB — CMP (CANCER CENTER ONLY)
ALT: 18 U/L (ref 0–44)
AST: 30 U/L (ref 15–41)
Albumin: 3.8 g/dL (ref 3.5–5.0)
Alkaline Phosphatase: 86 U/L (ref 38–126)
Anion gap: 6 (ref 5–15)
BUN: 14 mg/dL (ref 8–23)
CO2: 27 mmol/L (ref 22–32)
Calcium: 9 mg/dL (ref 8.9–10.3)
Chloride: 104 mmol/L (ref 98–111)
Creatinine: 0.75 mg/dL (ref 0.44–1.00)
GFR, Estimated: >60 mL/min (ref >60)
Glucose, Bld: 88 mg/dL (ref 70–99)
Potassium: 3.8 mmol/L (ref 3.5–5.1)
Sodium: 137 mmol/L (ref 135–145)
Total Bilirubin: 0.7 mg/dL (ref 0.3–1.2)
Total Protein: 6.7 g/dL (ref 6.5–8.1)

## 2021-12-03 LAB — CBC WITH DIFFERENTIAL (CANCER CENTER ONLY)
Abs Immature Granulocytes: 0.01 10*3/uL (ref 0.00–0.07)
Basophils Absolute: 0 10*3/uL (ref 0.0–0.1)
Basophils Relative: 0 %
Eosinophils Absolute: 0.1 10*3/uL (ref 0.0–0.5)
Eosinophils Relative: 2 %
HCT: 31 % — ABNORMAL LOW (ref 36.0–46.0)
Hemoglobin: 10.7 g/dL — ABNORMAL LOW (ref 12.0–15.0)
Immature Granulocytes: 0 %
Lymphocytes Relative: 26 %
Lymphs Abs: 1.5 10*3/uL (ref 0.7–4.0)
MCH: 32.4 pg (ref 26.0–34.0)
MCHC: 34.5 g/dL (ref 30.0–36.0)
MCV: 93.9 fL (ref 80.0–100.0)
Monocytes Absolute: 0.7 10*3/uL (ref 0.1–1.0)
Monocytes Relative: 11 %
Neutro Abs: 3.6 10*3/uL (ref 1.7–7.7)
Neutrophils Relative %: 61 %
Platelet Count: 227 10*3/uL (ref 150–400)
RBC: 3.3 MIL/uL — ABNORMAL LOW (ref 3.87–5.11)
RDW: 14.6 % (ref 11.5–15.5)
WBC Count: 5.9 10*3/uL (ref 4.0–10.5)
nRBC: 0 % (ref 0.0–0.2)

## 2021-12-03 LAB — TSH: TSH: 1.309 u[IU]/mL (ref 0.308–3.960)

## 2021-12-03 MED ORDER — SODIUM CHLORIDE 0.9% FLUSH
10.0000 mL | Freq: Once | INTRAVENOUS | Status: AC
  Administered 2021-12-03: 10 mL

## 2021-12-03 MED ORDER — SODIUM CHLORIDE 0.9 % IV SOLN
480.0000 mg | Freq: Once | INTRAVENOUS | Status: AC
  Administered 2021-12-03: 480 mg via INTRAVENOUS
  Filled 2021-12-03: qty 48

## 2021-12-03 MED ORDER — HEPARIN SOD (PORK) LOCK FLUSH 100 UNIT/ML IV SOLN
500.0000 [IU] | Freq: Once | INTRAVENOUS | Status: AC | PRN
  Administered 2021-12-03: 500 [IU]

## 2021-12-03 MED ORDER — SODIUM CHLORIDE 0.9% FLUSH
10.0000 mL | INTRAVENOUS | Status: DC | PRN
  Administered 2021-12-03: 10 mL

## 2021-12-03 MED ORDER — SODIUM CHLORIDE 0.9 % IV SOLN: Freq: Once | INTRAVENOUS | Status: AC

## 2021-12-03 NOTE — Progress Notes (Signed)
Hematology and Oncology Follow Up Visit  Sandra Mann [id number] 04/27/1951 71 y.o. 12/03/2021 10:02 AM Sandra Mann, MDEksir, Sandra Conroy, MD   Principle Diagnosis: 71 year old woman withT4N1 high-grade urothelial carcinoma of the ureter diagnosed in November 2021.   Prior Therapy: She is status post cystoscopy and biopsy in November 2021 which showed high-grade urothelial carcinoma. Neoadjuvant chemotherapy utilizing gemcitabine and cisplatin started on October 29, 2020.  She completed 4 cycles of therapy on January 07, 2021. She is status post robotic assisted laparoscopic cystectomy, hysterectomy and lymph node dissection completed on Mar 04, 2021 completed by Dr. Tammi Mann.  He final pathology showed T4N1 disease.  She had 1 out of 16 lymph node involvement.  Current therapy: Nivolumab 480 mg every 4 weeks started in July 2022.  She is here for cycle 7 of therapy.    Interim History: Sandra Mann presents today for return evaluation.  Since the last visit, she reports no Mann changes in her health.  She did develop pneumonia but did not require any hospitalizations.  She finished a course of antibiotics and has fully recovered at this time.  We will  Medications: Reviewed without changes. Current Outpatient Medications  Medication Sig Dispense Refill   acetaminophen (TYLENOL) 500 MG tablet Take 500 mg by mouth every 6 (six) hours as needed for moderate pain.     cyclobenzaprine (FLEXERIL) 10 MG tablet Take 1 tablet (10 mg total) by mouth 3 (three) times daily as needed for muscle spasms. 30 tablet 1   cycloSPORINE (RESTASIS) 0.05 % ophthalmic emulsion Place 1 drop into both eyes 2 (two) times daily.     folic acid (FOLVITE) 740 MCG tablet Take 1,600 mcg by mouth daily.      lidocaine-prilocaine (EMLA) cream Apply 1 application topically as needed. (Patient taking differently: Apply 1 application topically as needed (for the port).) 30 g 0   Melatonin 10 MG TABS Take 10 mg by mouth at  bedtime.     mesalamine (CANASA) 1000 MG suppository Place 1,000 mg rectally every other day. At bedtime     methotrexate (RHEUMATREX) 2.5 MG tablet Take 20 mg by mouth every Tuesday.     Multiple Vitamin (MULTIVITAMIN WITH MINERALS) TABS tablet Take 1 tablet by mouth at bedtime. Silver     naproxen (NAPROSYN) 500 MG tablet Take 1 tablet (500 mg total) by mouth 2 (two) times daily with a meal. 60 tablet 1   omeprazole (PRILOSEC) 40 MG capsule Take 40 mg by mouth daily.     ondansetron (ZOFRAN) 4 MG tablet Take 1 tablet (4 mg total) by mouth every 8 (eight) hours as needed for nausea or vomiting. (Patient not taking: Reported on 09/28/2021) 10 tablet 0   oxyCODONE-acetaminophen (PERCOCET) 5-325 MG tablet Take 1 tablet by mouth every 4 (four) hours as needed (max 6 q). (Patient not taking: Reported on 09/28/2021) 20 tablet 0   traMADol (ULTRAM) 50 MG tablet Take 1-2 tablets (50-100 mg total) by mouth every 6 (six) hours as needed for moderate pain. (Patient not taking: Reported on 09/28/2021) 20 tablet 0   valACYclovir (VALTREX) 500 MG tablet Take 1 tablet (500 mg total) by mouth daily. 14 tablet 1   No current facility-administered medications for this visit.     Allergies:  Allergies  Allergen Reactions   Codeine Nausea And Vomiting   Sulfa Antibiotics Nausea And Vomiting   Vicodin [Hydrocodone-Acetaminophen] Nausea And Vomiting      Physical Exam:   Blood pressure 130/64, pulse 79,  temperature 97.9 F (36.6 C), temperature source Axillary, resp. rate 15, height 5' 3.5" (1.613 m), weight 115 lb 3.2 oz (52.3 kg), SpO2 98 %.         ECOG: 0   General appearance: Alert, awake without any distress. Head: Atraumatic without abnormalities Oropharynx: Without any thrush or ulcers. Eyes: No scleral icterus. Lymph nodes: No lymphadenopathy noted in the cervical, supraclavicular, or axillary nodes Heart:regular rate and rhythm, without any murmurs or gallops.   Lung: Clear to  auscultation without any rhonchi, wheezes or dullness to percussion. Abdomin: Soft, nontender without any shifting dullness or ascites. Musculoskeletal: No clubbing or cyanosis. Neurological: No motor or sensory deficits. Skin: No rashes or lesions.             Lab Results: Lab Results  Component Value Date   WBC 6.7 11/05/2021   HGB 12.0 11/05/2021   HCT 34.6 (L) 11/05/2021   MCV 95.6 11/05/2021   PLT 215 11/05/2021     Chemistry      Component Value Date/Time   NA 135 11/05/2021 1114   K 4.1 11/05/2021 1114   CL 103 11/05/2021 1114   CO2 24 11/05/2021 1114   BUN 17 11/05/2021 1114   CREATININE 0.89 11/05/2021 1114      Component Value Date/Time   CALCIUM 9.3 11/05/2021 1114   ALKPHOS 74 11/05/2021 1114   AST 34 11/05/2021 1114   ALT 20 11/05/2021 1114   BILITOT 0.6 11/05/2021 1114        Impression and Plan:  71 year old woman with:   1.  Ureteral cancer diagnosed in 2021.  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  She continues to be on adjuvant nivolumab after completing chemotherapy, surgical resection without any evidence of metastatic disease.  The plan is to continue for a total of 12 cycles.  Complications including immune mediated issues as well as GI toxicity and dermatological issues were reiterated.  The plan is to update her staging scans in April 2023.  Different salvage therapy options including antibiotic drug conjugate and systemic chemotherapy.   2.  IV access: Port-A-Cath continues to be in use without any issues.   3.  Antiemetics: Compazine is available to her without any nausea or vomiting.   4.  Immune mediated complications: I continue to educate her about complication clued pneumonitis, colitis and thyroid disease.  She has not experienced any.   5.  Goals of care: Her disease is incurable although aggressive measures are warranted.    6.  Follow-up: She will return in 4 weeks for the next cycle of  therapy.   30  minutes were spent on this encounter.  The time was dedicated to updating her disease status, treatment choices discussion, answering questions about future plan of care and prognosis.  Sandra Button, MD 2/2/202310:02 AM

## 2021-12-03 NOTE — Patient Instructions (Signed)
[facility]  Discharge Instructions: ?Thank you for choosing [facility] to provide your oncology and hematology care.  ? ?If you have a lab appointment with the Cancer Center, please go directly to the Cancer Center and check in at the registration area. ?  ?Wear comfortable clothing and clothing appropriate for easy access to any Portacath or PICC line.  ? ?We strive to give you quality time with your provider. You may need to reschedule your appointment if you arrive late (15 or more minutes).  Arriving late affects you and other patients whose appointments are after yours.  Also, if you miss three or more appointments without notifying the office, you may be dismissed from the clinic at the provider?s discretion.    ?  ?For prescription refill requests, have your pharmacy contact our office and allow 72 hours for refills to be completed.   ? ?Today you received the following chemotherapy and/or immunotherapy agents Opdivo    ?  ?To help prevent nausea and vomiting after your treatment, we encourage you to take your nausea medication as directed. ? ?BELOW ARE SYMPTOMS THAT SHOULD BE REPORTED IMMEDIATELY: ?*FEVER GREATER THAN 100.4 F (38 ?C) OR HIGHER ?*CHILLS OR SWEATING ?*NAUSEA AND VOMITING THAT IS NOT CONTROLLED WITH YOUR NAUSEA MEDICATION ?*UNUSUAL SHORTNESS OF BREATH ?*UNUSUAL BRUISING OR BLEEDING ?*URINARY PROBLEMS (pain or burning when urinating, or frequent urination) ?*BOWEL PROBLEMS (unusual diarrhea, constipation, pain near the anus) ?TENDERNESS IN MOUTH AND THROAT WITH OR WITHOUT PRESENCE OF ULCERS (sore throat, sores in mouth, or a toothache) ?UNUSUAL RASH, SWELLING OR PAIN  ?UNUSUAL VAGINAL DISCHARGE OR ITCHING  ? ?Items with * indicate a potential emergency and should be followed up as soon as possible or go to the Emergency Department if any problems should occur. ? ?Please show the CHEMOTHERAPY ALERT CARD or IMMUNOTHERAPY ALERT CARD at check-in to the  Emergency Department and triage nurse. ? ?Should you have questions after your visit or need to cancel or reschedule your appointment, please contact [facility]  Dept: [phone]  and follow the prompts.  Office hours are 8:00 a.m. to 4:30 p.m. Monday - Friday. Please note that voicemails left after 4:00 p.m. may not be returned until the following business day.  We are closed weekends and major holidays. You have access to a nurse at all times for urgent questions. Please call the main number to the clinic Dept: [phone] and follow the prompts. ? ? ?For any non-urgent questions, you may also contact your provider using MyChart. We now offer e-Visits for anyone 18 and older to request care online for non-urgent symptoms. For details visit [email or web address]. ?  ?Also download the MyChart app! Go to the app store, search "MyChart", open the app, select [facility], and log in with your MyChart username and password. ? ?Due to Covid, a mask is required upon entering the hospital/clinic. If you do not have a mask, one will be given to you upon arrival. For doctor visits, patients may have 1 support person aged 18 or older with them. For treatment visits, patients cannot have anyone with them due to current Covid guidelines and our immunocompromised population.  ? ?

## 2021-12-08 ENCOUNTER — Other Ambulatory Visit: Payer: Self-pay

## 2021-12-08 ENCOUNTER — Ambulatory Visit: Payer: Medicare Other | Attending: Orthopedic Surgery | Admitting: Physical Therapy

## 2021-12-08 DIAGNOSIS — M25512 Pain in left shoulder: Secondary | ICD-10-CM | POA: Diagnosis present

## 2021-12-08 DIAGNOSIS — M25612 Stiffness of left shoulder, not elsewhere classified: Secondary | ICD-10-CM | POA: Diagnosis present

## 2021-12-08 NOTE — Therapy (Signed)
[facility] [place], [place], [address] Phone: [phone]   Fax:  [phone]  Physical Therapy Treatment  Patient Details  Name: Sandra Mann MRN: [id number] Date of Birth: [birth year] Referring Provider (PT): Supple   Encounter Date: 12/08/2021   PT End of Session - 12/08/21 1021     Visit Number 10    Number of Visits 16    Date for PT Re-Evaluation 12/11/21    PT Start Time 0902    PT Stop Time 8315    PT Time Calculation (min) 46 min    Activity Tolerance Patient tolerated treatment well    Behavior During Therapy [facility] for tasks assessed/performed             Past Medical History:  Diagnosis Date   Chest pain    GERD (gastroesophageal reflux disease)    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   Oral mass    left   Osteopenia    PONV (postoperative nausea and vomiting)    Rheumatoid arteritis ([place])    Rheumatoid arthritis ([place])    Ulcerative proctitis ([place])    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   Wears glasses     Past Surgical History:  Procedure Laterality Date   COLONOSCOPY     CYSTOSCOPY WITH INJECTION N/A 03/04/2021   Procedure: CYSTOSCOPY WITH INJECTION OF INDOCYANINE GREEN DYE;  Surgeon: Alexis Frock, MD;  [facility];  Service: Urology;  Laterality: N/A;   CYSTOSCOPY WITH URETHRAL DILATATION N/A 09/23/2020   Procedure: CYSTOSCOPY WITH URETHRAL DILATATION, EXAM UNDER ANESTHESIA, BIOPSY OF URETHRAL MASS;  Surgeon: Robley Fries, MD;  [facility];  Service: Urology;  Laterality: N/A;   EXCISION ORAL TUMOR Left 03/27/2014   Procedure: EXCISION OF ORAL CANCER;  Surgeon: Izora Gala, MD;  Location: [place];  Service: ENT;  Laterality: Left;   FINGER GANGLION CYST EXCISION Right 2007   small dip   INCISION AND DRAINAGE ABSCESS Left 06/12/2014   Procedure: LEFT INCISION AND DRAINAGE MANDIBULAR ABSCESS;  Surgeon: Izora Gala, MD;  Location: [place];   Service: ENT;  Laterality: Left;   INCISION AND DRAINAGE ABSCESS Left 11/22/2014   Procedure: INCISION AND DRAINAGE LEFT  MANDIBULAR ABSCESS;  Surgeon: Izora Gala, MD;  Location: [place];  Service: ENT;  Laterality: Left;   INCISION AND DRAINAGE OF PERITONSILLAR ABCESS Left 06/01/2014   Procedure: INCISION AND DRAINAGE Masticator Space Infection;  Surgeon: Izora Gala, MD;  Location: [place];  Service: ENT;  Laterality: Left;   IR IMAGING GUIDED PORT INSERTION  10/21/2020   LYMPH NODE DISSECTION Bilateral 03/04/2021   Procedure: LYMPH NODE DISSECTION;  Surgeon: Alexis Frock, MD;  [facility];  Service: Urology;  Laterality: Bilateral;   MULTIPLE EXTRACTIONS WITH ALVEOLOPLASTY Left 03/27/2014   Procedure: EXTRACTIONS OF NECESSARY TOOTH ;  Surgeon: Ceasar Mons, DDS;  Location: [place];  Service: Oral Surgery;  Laterality: Left;   REVERSE SHOULDER ARTHROPLASTY Left 09/03/2021   Procedure: REVERSE SHOULDER ARTHROPLASTY;  Surgeon: Justice Britain, MD;  [facility];  Service: Orthopedics;  Laterality: Left;   ROBOT ASSISTED LAPAROSCOPIC COMPLETE CYSTECT ILEAL CONDUIT N/A 03/04/2021   Procedure: XI ROBOTIC ASSISTED LAPAROSCOPIC COMPLETE CYSTECT ILEAL CONDUIT;  Surgeon: Alexis Frock, MD;  [facility];  Service: Urology;  Laterality: N/A;  6.5 HRS   ROBOTIC ASSISTED LAPAROSCOPIC HYSTERECTOMY AND SALPINGECTOMY Bilateral 03/04/2021   Procedure: XI ROBOTIC ASSISTED LAPAROSCOPIC HYSTERECTOMY AND SALPINGECTOMY;  Surgeon:  Alexis Frock, MD;  [facility];  Service: Urology;  Laterality: Bilateral;   TUBAL LIGATION  ~ 1988    There were no vitals filed for this visit.   Subjective Assessment - 12/08/21 1019     Subjective COVID-19 screen performed prior to patient entering clinic.  Patient presenting to clinic with no left shoulder pain.  She returned to her surgeon and X-rays looked great.  He said she could continue independently with her HEP but she has decided to finish  out her last few visits.    Pertinent History RA and history of cancer    Limitations Lifting    Patient Stated Goals reach overhead, wash under her other arm, buckle her seatbelt    Currently in Pain? No/denies    Pain Location Shoulder    Pain Orientation Left    Pain Descriptors / Indicators Aching    Pain Type Surgical pain    Pain Onset More than a month ago                               [facility] Adult PT Treatment/Exercise - 12/08/21 0001       Exercises   Exercises Shoulder      Shoulder Exercises: Pulleys   Flexion 5 minutes    Other Pulley Exercises Wall slides x 2 minutes f/b UE ranger on wall x 3 minutes.      Modalities   Modalities Electrical Stimulation;Moist Careers adviser Stimulation Location Left middle deltoid region.    Electrical Stimulation Action Low-level Pre-mod electrical stimulation at 80-150 Hz. with HMP x 15 minutes.    Electrical Stimulation Goals Pain      Manual Therapy   Manual Therapy Soft tissue mobilization    Soft tissue mobilization Gentle STW/M x 13 minutes to patient's left middle deltoid.                          PT Long Term Goals - 09/28/21 1219       PT LONG TERM GOAL #1   Title Patient will be independent with her HEP.    Time 8    Period Weeks    Status New    Target Date 11/23/21      PT LONG TERM GOAL #2   Title Patient will be able to demonstrate at least 120 degrees of active shoulder flexion with her left UE.    Time 8    Period Weeks    Status New    Target Date 11/23/21      PT LONG TERM GOAL #3   Title Patient will be able to demonstrate at least 100 degrees of left shoulder abduction for improved shoulder moiblity.    Time 8    Period Weeks    Status New    Target Date 11/23/21      PT LONG TERM GOAL #4   Title Patient will able to buckle her seatbelt with her left UE for improved safety.    Baseline unable    Time 8    Period Weeks     Status New    Target Date 11/23/21                   Plan - 12/08/21 1035     Clinical Impression Statement See "Therapy Note" section.    Personal  Factors and Comorbidities Comorbidity 1;Comorbidity 2    Comorbidities RA, history of cancer,    Examination-Activity Limitations Bathing;Carry;Reach Overhead    Examination-Participation Restrictions Cleaning    Stability/Clinical Decision Making Evolving/Moderate complexity    Rehab Potential Good    PT Frequency 2x / week    PT Duration 8 weeks    PT Treatment/Interventions Cryotherapy;Electrical Stimulation;Moist Heat;Neuromuscular re-education;Therapeutic exercise;Therapeutic activities;Patient/family education;Manual techniques;Passive range of motion;Taping;Vasopneumatic Device    PT Next Visit Plan Review HEP, progress per protocol, continue with left shoulder PROM.  Pulleys and UE Ranger, wall ladder, ball on wall.    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  See note below for Objective Data and Assessment of Progress/Goals. Patient returns to PT having last been seen on 1//6/23.  Today, she reports no pain and her active left shoulder active range of motion remains unchanged since last measured:  Flexion is 135 degrees and ER is 55 degrees.  She had a flare-up early last month and reported a nodule but this  has now resolved.  She had some palpable discomfort over her left middle deltoid that responded well to soft tissue work today.    Ayako Tapanes, [place], PT 12/08/2021, 10:37 AM  [facility] [address] [place], [place], [address] Phone: [phone]   Fax:  [phone]  Name: Sandra Mann MRN: [id number] Date of Birth: 06-Apr-1951

## 2021-12-09 ENCOUNTER — Ambulatory Visit: Payer: Medicare Other | Admitting: Physical Therapy

## 2021-12-09 ENCOUNTER — Other Ambulatory Visit: Payer: Self-pay

## 2021-12-09 DIAGNOSIS — M25612 Stiffness of left shoulder, not elsewhere classified: Secondary | ICD-10-CM

## 2021-12-09 DIAGNOSIS — M25512 Pain in left shoulder: Secondary | ICD-10-CM | POA: Diagnosis not present

## 2021-12-09 NOTE — Therapy (Signed)
[facility] [place], [place], [address] Phone: [phone]   Fax:  [phone]  Physical Therapy Treatment  Patient Details  Name: Sandra Mann MRN: [id number] Date of Birth: [birth year] Referring Provider (PT): Supple   Encounter Date: 12/09/2021   PT End of Session - 12/09/21 1235     Visit Number 11    Number of Visits 16    Date for PT Re-Evaluation 12/11/21    PT Start Time 1115    PT Stop Time 8916    PT Time Calculation (min) 46 min    Activity Tolerance Patient tolerated treatment well    Behavior During Therapy [facility] for tasks assessed/performed             Past Medical History:  Diagnosis Date   Chest pain    GERD (gastroesophageal reflux disease)    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   Oral mass    left   Osteopenia    PONV (postoperative nausea and vomiting)    Rheumatoid arteritis ([place])    Rheumatoid arthritis ([place])    Ulcerative proctitis ([place])    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   Wears glasses     Past Surgical History:  Procedure Laterality Date   COLONOSCOPY     CYSTOSCOPY WITH INJECTION N/A 03/04/2021   Procedure: CYSTOSCOPY WITH INJECTION OF INDOCYANINE GREEN DYE;  Surgeon: Alexis Frock, MD;  [facility];  Service: Urology;  Laterality: N/A;   CYSTOSCOPY WITH URETHRAL DILATATION N/A 09/23/2020   Procedure: CYSTOSCOPY WITH URETHRAL DILATATION, EXAM UNDER ANESTHESIA, BIOPSY OF URETHRAL MASS;  Surgeon: Robley Fries, MD;  [facility];  Service: Urology;  Laterality: N/A;   EXCISION ORAL TUMOR Left 03/27/2014   Procedure: EXCISION OF ORAL CANCER;  Surgeon: Izora Gala, MD;  Location: [place];  Service: ENT;  Laterality: Left;   FINGER GANGLION CYST EXCISION Right 2007   small dip   INCISION AND DRAINAGE ABSCESS Left 06/12/2014   Procedure: LEFT INCISION AND DRAINAGE MANDIBULAR ABSCESS;  Surgeon: Izora Gala, MD;  Location: [place];   Service: ENT;  Laterality: Left;   INCISION AND DRAINAGE ABSCESS Left 11/22/2014   Procedure: INCISION AND DRAINAGE LEFT  MANDIBULAR ABSCESS;  Surgeon: Izora Gala, MD;  Location: [place];  Service: ENT;  Laterality: Left;   INCISION AND DRAINAGE OF PERITONSILLAR ABCESS Left 06/01/2014   Procedure: INCISION AND DRAINAGE Masticator Space Infection;  Surgeon: Izora Gala, MD;  Location: [place];  Service: ENT;  Laterality: Left;   IR IMAGING GUIDED PORT INSERTION  10/21/2020   LYMPH NODE DISSECTION Bilateral 03/04/2021   Procedure: LYMPH NODE DISSECTION;  Surgeon: Alexis Frock, MD;  [facility];  Service: Urology;  Laterality: Bilateral;   MULTIPLE EXTRACTIONS WITH ALVEOLOPLASTY Left 03/27/2014   Procedure: EXTRACTIONS OF NECESSARY TOOTH ;  Surgeon: Ceasar Mons, DDS;  Location: [place];  Service: Oral Surgery;  Laterality: Left;   REVERSE SHOULDER ARTHROPLASTY Left 09/03/2021   Procedure: REVERSE SHOULDER ARTHROPLASTY;  Surgeon: Justice Britain, MD;  [facility];  Service: Orthopedics;  Laterality: Left;   ROBOT ASSISTED LAPAROSCOPIC COMPLETE CYSTECT ILEAL CONDUIT N/A 03/04/2021   Procedure: XI ROBOTIC ASSISTED LAPAROSCOPIC COMPLETE CYSTECT ILEAL CONDUIT;  Surgeon: Alexis Frock, MD;  [facility];  Service: Urology;  Laterality: N/A;  6.5 HRS   ROBOTIC ASSISTED LAPAROSCOPIC HYSTERECTOMY AND SALPINGECTOMY Bilateral 03/04/2021   Procedure: XI ROBOTIC ASSISTED LAPAROSCOPIC HYSTERECTOMY AND SALPINGECTOMY;  Surgeon:  Alexis Frock, MD;  [facility];  Service: Urology;  Laterality: Bilateral;   TUBAL LIGATION  ~ 1988    There were no vitals filed for this visit.   Subjective Assessment - 12/09/21 1229     Subjective COVID-19 screen performed prior to patient entering clinic.  Pain low at rest but increases with increased use and activity.  Patient requesting soft tissue work today.    Pertinent History RA and history of cancer    Limitations Lifting    Patient Stated  Goals reach overhead, wash under her other arm, buckle her seatbelt    Pain Type Surgical pain    Pain Onset More than a month ago                               [facility] Adult PT Treatment/Exercise - 12/09/21 0001       Modalities   Modalities Electrical Stimulation;Moist Heat      Moist Heat Therapy   Number Minutes Moist Heat 15 Minutes    Moist Heat Location --   Left shoulder.     Acupuncturist Location Left shoulder.    Electrical Stimulation Action Low-level IFC at 80-150 Hz.    Electrical Stimulation Parameters 40% scan x 15 minutes.    Electrical Stimulation Goals Pain      Manual Therapy   Manual Therapy Soft tissue mobilization    Soft tissue mobilization In supine:  Gentle STW/m x 23 minutes to patient's left shoulder musculature and biceps.                          PT Long Term Goals - 09/28/21 1219       PT LONG TERM GOAL #1   Title Patient will be independent with her HEP.    Time 8    Period Weeks    Status New    Target Date 11/23/21      PT LONG TERM GOAL #2   Title Patient will be able to demonstrate at least 120 degrees of active shoulder flexion with her left UE.    Time 8    Period Weeks    Status New    Target Date 11/23/21      PT LONG TERM GOAL #3   Title Patient will be able to demonstrate at least 100 degrees of left shoulder abduction for improved shoulder moiblity.    Time 8    Period Weeks    Status New    Target Date 11/23/21      PT LONG TERM GOAL #4   Title Patient will able to buckle her seatbelt with her left UE for improved safety.    Baseline unable    Time 8    Period Weeks    Status New    Target Date 11/23/21                   Plan - 12/09/21 1240     Clinical Impression Statement Patient did well with STW/M to her left UE.  She had some palpable tenderness over her left middle deltoid and muscle belly of her left biceps.    Personal  Factors and Comorbidities Comorbidity 1;Comorbidity 2    Comorbidities RA, history of cancer,    Examination-Activity Limitations Bathing;Carry;Reach Overhead    Examination-Participation Restrictions Cleaning    Stability/Clinical Decision Making Evolving/Moderate complexity  Rehab Potential Good    PT Frequency 2x / week    PT Duration 8 weeks    PT Treatment/Interventions Cryotherapy;Electrical Stimulation;Moist Heat;Neuromuscular re-education;Therapeutic exercise;Therapeutic activities;Patient/family education;Manual techniques;Passive range of motion;Taping;Vasopneumatic Device    PT Next Visit Plan Review HEP, progress per protocol, continue with left shoulder PROM.  Pulleys and UE Ranger, wall ladder, ball on wall.    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

## 2021-12-11 ENCOUNTER — Ambulatory Visit: Payer: Medicare Other | Admitting: Physical Therapy

## 2021-12-11 ENCOUNTER — Other Ambulatory Visit: Payer: Self-pay

## 2021-12-11 DIAGNOSIS — M25512 Pain in left shoulder: Secondary | ICD-10-CM | POA: Diagnosis not present

## 2021-12-11 DIAGNOSIS — M25612 Stiffness of left shoulder, not elsewhere classified: Secondary | ICD-10-CM

## 2021-12-11 NOTE — Therapy (Signed)
[facility] [place], [place], [address] Phone: [phone]   Fax:  [phone]  Physical Therapy Treatment  Patient Details  Name: Sandra Mann MRN: [id number] Date of Birth: [birth year] Referring Provider (PT): Supple   Encounter Date: 12/11/2021   PT End of Session - 12/11/21 1149     Visit Number 12    Number of Visits 16    Date for PT Re-Evaluation 12/11/21    PT Start Time 0947    PT Stop Time 1021    PT Time Calculation (min) 34 min    Activity Tolerance Patient tolerated treatment well    Behavior During Therapy [facility] for tasks assessed/performed             Past Medical History:  Diagnosis Date   Chest pain    GERD (gastroesophageal reflux disease)    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   Oral mass    left   Osteopenia    PONV (postoperative nausea and vomiting)    Rheumatoid arteritis ([place])    Rheumatoid arthritis ([place])    Ulcerative proctitis ([place])    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   Wears glasses     Past Surgical History:  Procedure Laterality Date   COLONOSCOPY     CYSTOSCOPY WITH INJECTION N/A 03/04/2021   Procedure: CYSTOSCOPY WITH INJECTION OF INDOCYANINE GREEN DYE;  Surgeon: Alexis Frock, MD;  [facility];  Service: Urology;  Laterality: N/A;   CYSTOSCOPY WITH URETHRAL DILATATION N/A 09/23/2020   Procedure: CYSTOSCOPY WITH URETHRAL DILATATION, EXAM UNDER ANESTHESIA, BIOPSY OF URETHRAL MASS;  Surgeon: Robley Fries, MD;  [facility];  Service: Urology;  Laterality: N/A;   EXCISION ORAL TUMOR Left 03/27/2014   Procedure: EXCISION OF ORAL CANCER;  Surgeon: Izora Gala, MD;  Location: [place];  Service: ENT;  Laterality: Left;   FINGER GANGLION CYST EXCISION Right 2007   small dip   INCISION AND DRAINAGE ABSCESS Left 06/12/2014   Procedure: LEFT INCISION AND DRAINAGE MANDIBULAR ABSCESS;  Surgeon: Izora Gala, MD;  Location: [place];   Service: ENT;  Laterality: Left;   INCISION AND DRAINAGE ABSCESS Left 11/22/2014   Procedure: INCISION AND DRAINAGE LEFT  MANDIBULAR ABSCESS;  Surgeon: Izora Gala, MD;  Location: [place];  Service: ENT;  Laterality: Left;   INCISION AND DRAINAGE OF PERITONSILLAR ABCESS Left 06/01/2014   Procedure: INCISION AND DRAINAGE Masticator Space Infection;  Surgeon: Izora Gala, MD;  Location: [place];  Service: ENT;  Laterality: Left;   IR IMAGING GUIDED PORT INSERTION  10/21/2020   LYMPH NODE DISSECTION Bilateral 03/04/2021   Procedure: LYMPH NODE DISSECTION;  Surgeon: Alexis Frock, MD;  [facility];  Service: Urology;  Laterality: Bilateral;   MULTIPLE EXTRACTIONS WITH ALVEOLOPLASTY Left 03/27/2014   Procedure: EXTRACTIONS OF NECESSARY TOOTH ;  Surgeon: Ceasar Mons, DDS;  Location: [place];  Service: Oral Surgery;  Laterality: Left;   REVERSE SHOULDER ARTHROPLASTY Left 09/03/2021   Procedure: REVERSE SHOULDER ARTHROPLASTY;  Surgeon: Justice Britain, MD;  [facility];  Service: Orthopedics;  Laterality: Left;   ROBOT ASSISTED LAPAROSCOPIC COMPLETE CYSTECT ILEAL CONDUIT N/A 03/04/2021   Procedure: XI ROBOTIC ASSISTED LAPAROSCOPIC COMPLETE CYSTECT ILEAL CONDUIT;  Surgeon: Alexis Frock, MD;  [facility];  Service: Urology;  Laterality: N/A;  6.5 HRS   ROBOTIC ASSISTED LAPAROSCOPIC HYSTERECTOMY AND SALPINGECTOMY Bilateral 03/04/2021   Procedure: XI ROBOTIC ASSISTED LAPAROSCOPIC HYSTERECTOMY AND SALPINGECTOMY;  Surgeon:  Alexis Frock, MD;  [facility];  Service: Urology;  Laterality: Bilateral;   TUBAL LIGATION  ~ 1988    There were no vitals filed for this visit.   Subjective Assessment - 12/11/21 1150     Subjective COVID-19 screen performed prior to patient entering clinic.  No pain at rest.    Pertinent History RA and history of cancer    Limitations Lifting    Patient Stated Goals reach overhead, wash under her other arm, buckle her seatbelt    Currently in Pain?  No/denies                               [facility] Adult PT Treatment/Exercise - 12/11/21 0001       Electrical Stimulation   Electrical Stimulation Location Left shoulder    Electrical Stimulation Action Low-level IFC at 80-150 Hz    Electrical Stimulation Parameters 100% scan x 15 minutes.    Electrical Stimulation Goals Pain      Manual Therapy   Manual Therapy Soft tissue mobilization    Soft tissue mobilization In supine:  Gentle STW/M x 8 minutes to patient's left shoulder musculature.                          PT Long Term Goals - 09/28/21 1219       PT LONG TERM GOAL #1   Title Patient will be independent with her HEP.    Time 8    Period Weeks    Status New    Target Date 11/23/21      PT LONG TERM GOAL #2   Title Patient will be able to demonstrate at least 120 degrees of active shoulder flexion with her left UE.    Time 8    Period Weeks    Status New    Target Date 11/23/21      PT LONG TERM GOAL #3   Title Patient will be able to demonstrate at least 100 degrees of left shoulder abduction for improved shoulder moiblity.    Time 8    Period Weeks    Status New    Target Date 11/23/21      PT LONG TERM GOAL #4   Title Patient will able to buckle her seatbelt with her left UE for improved safety.    Baseline unable    Time 8    Period Weeks    Status New    Target Date 11/23/21                   Plan - 12/11/21 1156     Clinical Impression Statement The patient reports no left shoulder pain at rest. Her antigravity left shoulder flexion was measured to 135 degrees today.  Noted a very small "scratched" area on left shoulder.  She states that it is due to her immunotherapy and also had a large area on her right arm.  Electrical performed away from this area.  She tolerated treatment without complaint.    Personal Factors and Comorbidities Comorbidity 1;Comorbidity 2    Comorbidities RA, history of cancer,     Examination-Activity Limitations Bathing;Carry;Reach Overhead    Examination-Participation Restrictions Cleaning    Stability/Clinical Decision Making Evolving/Moderate complexity             Patient will benefit from skilled therapeutic intervention in order to improve the following deficits and impairments:  Decreased range of motion, Impaired UE functional use, Decreased activity tolerance, Pain, Decreased strength  Visit Diagnosis: Acute pain of left shoulder  Stiffness of left shoulder, not elsewhere classified     Problem List Patient Active Problem List   Diagnosis Date Noted   Port-A-Cath in place 01/14/2021   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

## 2021-12-14 ENCOUNTER — Other Ambulatory Visit: Payer: Self-pay

## 2021-12-14 ENCOUNTER — Ambulatory Visit: Payer: Medicare Other | Admitting: *Deleted

## 2021-12-14 DIAGNOSIS — M25512 Pain in left shoulder: Secondary | ICD-10-CM | POA: Diagnosis not present

## 2021-12-14 DIAGNOSIS — M25612 Stiffness of left shoulder, not elsewhere classified: Secondary | ICD-10-CM

## 2021-12-14 NOTE — Therapy (Signed)
[facility] [place], [place], [address] Phone: [phone]   Fax:  [phone]  Physical Therapy Treatment  Patient Details  Name: Sandra Mann MRN: [id number] Date of Birth: 04/28/1951 Referring Provider (PT): Supple   Encounter Date: 12/14/2021   PT End of Session - 12/14/21 0915     Visit Number 13    Number of Visits 16    Date for PT Re-Evaluation 12/11/21    PT Start Time 0900    PT Stop Time 1155    PT Time Calculation (min) 52 min    Activity Tolerance Patient tolerated treatment well             Past Medical History:  Diagnosis Date   Chest pain    GERD (gastroesophageal reflux disease)    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   Oral mass    left   Osteopenia    PONV (postoperative nausea and vomiting)    Rheumatoid arteritis ([place])    Rheumatoid arthritis ([place])    Ulcerative proctitis ([place])    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   Wears glasses     Past Surgical History:  Procedure Laterality Date   COLONOSCOPY     CYSTOSCOPY WITH INJECTION N/A 03/04/2021   Procedure: CYSTOSCOPY WITH INJECTION OF INDOCYANINE GREEN DYE;  Surgeon: Alexis Frock, MD;  [facility];  Service: Urology;  Laterality: N/A;   CYSTOSCOPY WITH URETHRAL DILATATION N/A 09/23/2020   Procedure: CYSTOSCOPY WITH URETHRAL DILATATION, EXAM UNDER ANESTHESIA, BIOPSY OF URETHRAL MASS;  Surgeon: Robley Fries, MD;  [facility];  Service: Urology;  Laterality: N/A;   EXCISION ORAL TUMOR Left 03/27/2014   Procedure: EXCISION OF ORAL CANCER;  Surgeon: Izora Gala, MD;  Location: [place];  Service: ENT;  Laterality: Left;   FINGER GANGLION CYST EXCISION Right 2007   small dip   INCISION AND DRAINAGE ABSCESS Left 06/12/2014   Procedure: LEFT INCISION AND DRAINAGE MANDIBULAR ABSCESS;  Surgeon: Izora Gala, MD;  Location: [place];  Service: ENT;  Laterality: Left;   INCISION AND DRAINAGE ABSCESS  Left 11/22/2014   Procedure: INCISION AND DRAINAGE LEFT  MANDIBULAR ABSCESS;  Surgeon: Izora Gala, MD;  Location: [place];  Service: ENT;  Laterality: Left;   INCISION AND DRAINAGE OF PERITONSILLAR ABCESS Left 06/01/2014   Procedure: INCISION AND DRAINAGE Masticator Space Infection;  Surgeon: Izora Gala, MD;  Location: [place];  Service: ENT;  Laterality: Left;   IR IMAGING GUIDED PORT INSERTION  10/21/2020   LYMPH NODE DISSECTION Bilateral 03/04/2021   Procedure: LYMPH NODE DISSECTION;  Surgeon: Alexis Frock, MD;  [facility];  Service: Urology;  Laterality: Bilateral;   MULTIPLE EXTRACTIONS WITH ALVEOLOPLASTY Left 03/27/2014   Procedure: EXTRACTIONS OF NECESSARY TOOTH ;  Surgeon: Ceasar Mons, DDS;  Location: [place];  Service: Oral Surgery;  Laterality: Left;   REVERSE SHOULDER ARTHROPLASTY Left 09/03/2021   Procedure: REVERSE SHOULDER ARTHROPLASTY;  Surgeon: Justice Britain, MD;  [facility];  Service: Orthopedics;  Laterality: Left;   ROBOT ASSISTED LAPAROSCOPIC COMPLETE CYSTECT ILEAL CONDUIT N/A 03/04/2021   Procedure: XI ROBOTIC ASSISTED LAPAROSCOPIC COMPLETE CYSTECT ILEAL CONDUIT;  Surgeon: Alexis Frock, MD;  [facility];  Service: Urology;  Laterality: N/A;  6.5 HRS   ROBOTIC ASSISTED LAPAROSCOPIC HYSTERECTOMY AND SALPINGECTOMY Bilateral 03/04/2021   Procedure: XI ROBOTIC ASSISTED LAPAROSCOPIC HYSTERECTOMY AND SALPINGECTOMY;  Surgeon: Alexis Frock, MD;  [facility];  Service: Urology;  Laterality: Bilateral;   TUBAL LIGATION  ~ 1988    There were no vitals filed for this visit.   Subjective Assessment - 12/14/21 0910     Subjective COVID-19 screen performed prior to patient entering clinic.  No pain at rest.4/10 with use. Would like to cont. PT to work on strengthening and decrease pain with ADL's    Pertinent History RA and history of cancer    Limitations Lifting    Patient Stated Goals reach overhead, wash under her other arm, buckle her  seatbelt    Currently in Pain? Yes    Pain Score 4     Pain Location Shoulder    Pain Orientation Left    Pain Descriptors / Indicators Aching    Pain Type Surgical pain    Pain Onset More than a month ago                               [facility] Adult PT Treatment/Exercise - 12/14/21 0001       Exercises   Exercises Shoulder      Shoulder Exercises: Standing   Protraction Strengthening;Left;20 reps;Theraband   2x10   Theraband Level (Shoulder Protraction) Level 1 (Yellow)    Internal Rotation Strengthening;Left;20 reps;Theraband   2x10   Theraband Level (Shoulder Internal Rotation) Level 1 (Yellow)    Extension Strengthening;Left;20 reps;Theraband   2x10   Theraband Level (Shoulder Extension) Level 1 (Yellow)    Row Strengthening;Left;20 reps;Theraband   2x10   Theraband Level (Shoulder Row) Level 1 (Yellow)    Shoulder Elevation AROM   3x10   Other Standing Exercises handout and tubing given      Shoulder Exercises: Pulleys   Other Pulley Exercises UE ranger flexion, CW/CCW 3x10      Electrical Stimulation   Electrical Stimulation Location Left shoulder    Electrical Stimulation Action Premod x 15 mins    Electrical Stimulation Parameters 80-_0     Electrical Stimulation Goals Pain      Vasopneumatic   Number Minutes Vasopneumatic  15 minutes    Vasopnuematic Location  Shoulder    Vasopneumatic Pressure Low    Vasopneumatic Temperature  34                          PT Long Term Goals - 12/14/21 1007       PT LONG TERM GOAL #1   Title Patient will be independent with her HEP.    Time 8    Period Weeks    Status Partially Met      PT LONG TERM GOAL #2   Title Patient will be able to demonstrate at least 120 degrees of active shoulder flexion with her left UE.    Period Weeks    Status Achieved      PT LONG TERM GOAL #3   Title Patient will be able to demonstrate at least 100 degrees of left shoulder abduction for improved  shoulder moiblity.    Time 8    Period Weeks    Status Partially Met      PT LONG TERM GOAL #4   Title Patient will able to buckle her seatbelt with her left UE for improved safety.    Period Weeks    Status Partially Met                   Plan - 12/14/21 1003  Clinical Impression Statement Pt arrived today doing fairly well with LT shldr , but reports having 4/10 pain and higher sometimes while using LT UE with ADL's. She reports wanting to continue PT to work on strengthening and decreasing  pain with ADL's. Pt did well with light strengthening exs today. yellow tband and handout given for HEP. Pt to call MD office about continuing PT.    Personal Factors and Comorbidities Comorbidity 1;Comorbidity 2    Comorbidities RA, history of cancer,    Examination-Activity Limitations Bathing;Carry;Reach Overhead    Examination-Participation Restrictions Cleaning    Stability/Clinical Decision Making Evolving/Moderate complexity    Rehab Potential Good    PT Frequency 2x / week    PT Duration 8 weeks    PT Treatment/Interventions Cryotherapy;Electrical Stimulation;Moist Heat;Neuromuscular re-education;Therapeutic exercise;Therapeutic activities;Patient/family education;Manual techniques;Passive range of motion;Taping;Vasopneumatic Device    PT Next Visit Plan Review HEP, progress per protocol, continue with left shoulder PROM.  Pulleys and UE Ranger, wall ladder, ball on wall.    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

## 2021-12-18 ENCOUNTER — Other Ambulatory Visit: Payer: Self-pay

## 2021-12-18 ENCOUNTER — Ambulatory Visit: Payer: Medicare Other | Admitting: *Deleted

## 2021-12-18 DIAGNOSIS — M25512 Pain in left shoulder: Secondary | ICD-10-CM | POA: Diagnosis not present

## 2021-12-18 DIAGNOSIS — M25612 Stiffness of left shoulder, not elsewhere classified: Secondary | ICD-10-CM

## 2021-12-18 NOTE — Therapy (Signed)
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   Oral mass    left   Osteopenia    PONV (postoperative nausea and vomiting)    Rheumatoid arteritis ([place])    Rheumatoid arthritis ([place])    Ulcerative proctitis ([place])    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   Wears glasses     Past Surgical History:  Procedure Laterality Date   COLONOSCOPY     CYSTOSCOPY WITH INJECTION N/A 03/04/2021   Procedure: CYSTOSCOPY WITH INJECTION OF INDOCYANINE GREEN DYE;  Surgeon: Alexis Frock, MD;  [facility];  Service: Urology;  Laterality: N/A;   CYSTOSCOPY WITH URETHRAL DILATATION N/A 09/23/2020   Procedure: CYSTOSCOPY WITH URETHRAL DILATATION, EXAM UNDER ANESTHESIA, BIOPSY OF URETHRAL MASS;  Surgeon: Robley Fries, MD;  [facility];  Service: Urology;  Laterality: N/A;   EXCISION ORAL TUMOR Left 03/27/2014   Procedure: EXCISION OF ORAL CANCER;  Surgeon: Izora Gala, MD;  Location: [place];  Service: ENT;  Laterality: Left;   FINGER GANGLION CYST EXCISION Right 2007   small dip   INCISION AND DRAINAGE ABSCESS Left 06/12/2014   Procedure: LEFT INCISION AND DRAINAGE MANDIBULAR ABSCESS;  Surgeon: Izora Gala, MD;  Location: [place];  Service: ENT;  Laterality: Left;   INCISION AND DRAINAGE ABSCESS Left 11/22/2014   Procedure: INCISION AND DRAINAGE LEFT   MANDIBULAR ABSCESS;  Surgeon: Izora Gala, MD;  Location: [place];  Service: ENT;  Laterality: Left;   INCISION AND DRAINAGE OF PERITONSILLAR ABCESS Left 06/01/2014   Procedure: INCISION AND DRAINAGE Masticator Space Infection;  Surgeon: Izora Gala, MD;  Location: [place];  Service: ENT;  Laterality: Left;   IR IMAGING GUIDED PORT INSERTION  10/21/2020   LYMPH NODE DISSECTION Bilateral 03/04/2021   Procedure: LYMPH NODE DISSECTION;  Surgeon: Alexis Frock, MD;  [facility];  Service: Urology;  Laterality: Bilateral;   MULTIPLE EXTRACTIONS WITH ALVEOLOPLASTY Left 03/27/2014   Procedure: EXTRACTIONS OF NECESSARY TOOTH ;  Surgeon: Ceasar Mons, DDS;  Location: [place];  Service: Oral Surgery;  Laterality: Left;   REVERSE SHOULDER ARTHROPLASTY Left 09/03/2021   Procedure: REVERSE SHOULDER ARTHROPLASTY;  Surgeon: Justice Britain, MD;  [facility];  Service: Orthopedics;  Laterality: Left;   ROBOT ASSISTED LAPAROSCOPIC COMPLETE CYSTECT ILEAL CONDUIT N/A 03/04/2021   Procedure: XI ROBOTIC ASSISTED LAPAROSCOPIC COMPLETE CYSTECT ILEAL CONDUIT;  Surgeon: Alexis Frock, MD;  [facility];  Service: Urology;  Laterality: N/A;  6.5 HRS   ROBOTIC ASSISTED LAPAROSCOPIC HYSTERECTOMY AND SALPINGECTOMY Bilateral 03/04/2021   Procedure: XI ROBOTIC ASSISTED LAPAROSCOPIC HYSTERECTOMY AND SALPINGECTOMY;  Surgeon: Alexis Frock, MD;  [facility];  Service: Urology;  Laterality: Bilateral;   TUBAL LIGATION  ~  1988    There were no vitals filed for this visit.   Subjective Assessment - 12/18/21 0949     Subjective COVID-19 screen performed prior to patient entering clinic.  LT shldr soreness    Pertinent History RA and history of cancer    Limitations Lifting    Patient Stated Goals reach overhead, wash under her other arm, buckle her seatbelt    Currently in Pain? Yes    Pain Score 4     Pain Location Shoulder    Pain Orientation Left    Pain Descriptors / Indicators Sore                                [facility] Adult PT Treatment/Exercise - 12/18/21 0001       Exercises   Exercises Shoulder      Shoulder Exercises: Standing   Protraction Strengthening;Left;20 reps;Theraband    Theraband Level (Shoulder Protraction) Level 1 (Yellow)    Internal Rotation Strengthening;Left;20 reps;Theraband   2x10   Theraband Level (Shoulder Internal Rotation) Level 1 (Yellow)    Extension Strengthening;Left;20 reps;Theraband   2x10   Theraband Level (Shoulder Extension) Level 1 (Yellow)    Row Strengthening;Left;20 reps;Theraband   2x10   Theraband Level (Shoulder Row) Level 1 (Yellow)    Shoulder Elevation AROM   3x10   Other Standing Exercises Bicep curl attempted 3# x5  but stopped due to pain.      Shoulder Exercises: Pulleys   Other Pulley Exercises UE ranger flexion, CW/CCW 3x10      Electrical Stimulation   Electrical Stimulation Location Left shoulder    Electrical Stimulation Action premod x 15 mins    Electrical Stimulation Parameters 80-_0     Electrical Stimulation Goals Pain      Vasopneumatic   Number Minutes Vasopneumatic  15 minutes    Vasopnuematic Location  Shoulder    Vasopneumatic Pressure Low    Vasopneumatic Temperature  34                          PT Long Term Goals - 12/14/21 1007       PT LONG TERM GOAL #1   Title Patient will be independent with her HEP.    Time 8    Period Weeks    Status Partially Met      PT LONG TERM GOAL #2   Title Patient will be able to demonstrate at least 120 degrees of active shoulder flexion with her left UE.    Period Weeks    Status Achieved      PT LONG TERM GOAL #3   Title Patient will be able to demonstrate at least 100 degrees of left shoulder abduction for improved shoulder moiblity.    Time 8    Period Weeks    Status Partially Met      PT LONG TERM GOAL #4   Title Patient will able to buckle her seatbelt with her left UE for improved safety.    Period  Weeks    Status Partially Met                   Plan - 12/18/21 0959     Clinical Impression Statement Pt arrived today doing fairly well with mainly LT shldr soreness. She was able to continue with light strengthening exs for LT shldr with  mainly fatigue.Bicep curls were attempted today,  but Pt had increased pain so it was discontinued.Pt has partially met majority of LTGs at this time. Normal estim/ Vaso end of session.    Personal Factors and Comorbidities Comorbidity 1;Comorbidity 2    Comorbidities RA, history of cancer,    Examination-Activity Limitations Bathing;Carry;Reach Overhead    Stability/Clinical Decision Making Evolving/Moderate complexity    PT Treatment/Interventions Cryotherapy;Electrical Stimulation;Moist Heat;Neuromuscular re-education;Therapeutic exercise;Therapeutic activities;Patient/family education;Manual techniques;Passive range of motion;Taping;Vasopneumatic Device    PT Next Visit Plan Review HEP, progress per protocol, continue with left shoulder PROM.  Pulleys and UE Ranger, wall ladder, ball on wall.             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

## 2021-12-21 ENCOUNTER — Ambulatory Visit: Payer: Medicare Other | Admitting: Physical Therapy

## 2021-12-21 ENCOUNTER — Other Ambulatory Visit: Payer: Self-pay

## 2021-12-21 DIAGNOSIS — M25512 Pain in left shoulder: Secondary | ICD-10-CM

## 2021-12-21 DIAGNOSIS — M25612 Stiffness of left shoulder, not elsewhere classified: Secondary | ICD-10-CM

## 2021-12-21 NOTE — Therapy (Signed)
[facility] [place], [place], [address] Phone: [phone]   Fax:  [phone]  Physical Therapy Treatment  Patient Details  Name: Sandra Mann MRN: [id number] Date of Birth: 1951-09-02 Referring Provider (PT): Supple   Encounter Date: 12/21/2021   PT End of Session - 12/21/21 1021     Visit Number 15    Number of Visits 24    Date for PT Re-Evaluation 01/08/22    PT Start Time 0900    PT Stop Time 0940    PT Time Calculation (min) 40 min    Activity Tolerance Patient tolerated treatment well    Behavior During Therapy [facility] for tasks assessed/performed             Past Medical History:  Diagnosis Date   Chest pain    GERD (gastroesophageal reflux disease)    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   Oral mass    left   Osteopenia    PONV (postoperative nausea and vomiting)    Rheumatoid arteritis ([place])    Rheumatoid arthritis ([place])    Ulcerative proctitis ([place])    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   Wears glasses     Past Surgical History:  Procedure Laterality Date   COLONOSCOPY     CYSTOSCOPY WITH INJECTION N/A 03/04/2021   Procedure: CYSTOSCOPY WITH INJECTION OF INDOCYANINE GREEN DYE;  Surgeon: Alexis Frock, MD;  [facility];  Service: Urology;  Laterality: N/A;   CYSTOSCOPY WITH URETHRAL DILATATION N/A 09/23/2020   Procedure: CYSTOSCOPY WITH URETHRAL DILATATION, EXAM UNDER ANESTHESIA, BIOPSY OF URETHRAL MASS;  Surgeon: Robley Fries, MD;  [facility];  Service: Urology;  Laterality: N/A;   EXCISION ORAL TUMOR Left 03/27/2014   Procedure: EXCISION OF ORAL CANCER;  Surgeon: Izora Gala, MD;  Location: [place];  Service: ENT;  Laterality: Left;   FINGER GANGLION CYST EXCISION Right 2007   small dip   INCISION AND DRAINAGE ABSCESS Left 06/12/2014   Procedure: LEFT INCISION AND DRAINAGE MANDIBULAR ABSCESS;  Surgeon: Izora Gala, MD;  Location: [place];   Service: ENT;  Laterality: Left;   INCISION AND DRAINAGE ABSCESS Left 11/22/2014   Procedure: INCISION AND DRAINAGE LEFT  MANDIBULAR ABSCESS;  Surgeon: Izora Gala, MD;  Location: [place];  Service: ENT;  Laterality: Left;   INCISION AND DRAINAGE OF PERITONSILLAR ABCESS Left 06/01/2014   Procedure: INCISION AND DRAINAGE Masticator Space Infection;  Surgeon: Izora Gala, MD;  Location: [place];  Service: ENT;  Laterality: Left;   IR IMAGING GUIDED PORT INSERTION  10/21/2020   LYMPH NODE DISSECTION Bilateral 03/04/2021   Procedure: LYMPH NODE DISSECTION;  Surgeon: Alexis Frock, MD;  [facility];  Service: Urology;  Laterality: Bilateral;   MULTIPLE EXTRACTIONS WITH ALVEOLOPLASTY Left 03/27/2014   Procedure: EXTRACTIONS OF NECESSARY TOOTH ;  Surgeon: Ceasar Mons, DDS;  Location: [place];  Service: Oral Surgery;  Laterality: Left;   REVERSE SHOULDER ARTHROPLASTY Left 09/03/2021   Procedure: REVERSE SHOULDER ARTHROPLASTY;  Surgeon: Justice Britain, MD;  [facility];  Service: Orthopedics;  Laterality: Left;   ROBOT ASSISTED LAPAROSCOPIC COMPLETE CYSTECT ILEAL CONDUIT N/A 03/04/2021   Procedure: XI ROBOTIC ASSISTED LAPAROSCOPIC COMPLETE CYSTECT ILEAL CONDUIT;  Surgeon: Alexis Frock, MD;  [facility];  Service: Urology;  Laterality: N/A;  6.5 HRS   ROBOTIC ASSISTED LAPAROSCOPIC HYSTERECTOMY AND SALPINGECTOMY Bilateral 03/04/2021   Procedure: XI ROBOTIC ASSISTED LAPAROSCOPIC HYSTERECTOMY AND SALPINGECTOMY;  Surgeon:  Alexis Frock, MD;  [facility];  Service: Urology;  Laterality: Bilateral;   TUBAL LIGATION  ~ 1988    There were no vitals filed for this visit.   Subjective Assessment - 12/21/21 1019     Subjective Sore from doing HEP twice a day over the weekend.    Pertinent History RA and history of cancer    Limitations Lifting    Patient Stated Goals reach overhead, wash under her other arm, buckle her seatbelt    Currently in Pain? Yes    Pain Score 4      Pain Location Shoulder    Pain Orientation Left    Pain Descriptors / Indicators Sore    Pain Type Surgical pain    Pain Onset More than a month ago                               [facility] Adult PT Treatment/Exercise - 12/21/21 0001       Exercises   Exercises Shoulder      Shoulder Exercises: Standing   Other Standing Exercises UE Ranger on wall into flexion and circles, CW and CCW x 3 minutes.    Other Standing Exercises Yellow theraband [id number]      Shoulder Exercises: ROM/Strengthening   UBE (Upper Arm Bike) 8 minutes at 120 RPM's.      Modalities   Modalities Electrical Stimulation;Vasopneumatic      Moist Heat Therapy   Number Minutes Moist Heat 20 Minutes      Electrical Stimulation   Electrical Stimulation Location Left shoulder    Electrical Stimulation Action Pre-mod.    Electrical Stimulation Parameters 80-150 Hz x 20 minutes.    Electrical Stimulation Goals Pain                          PT Long Term Goals - 12/14/21 1007       PT LONG TERM GOAL #1   Title Patient will be independent with her HEP.    Time 8    Period Weeks    Status Partially Met      PT LONG TERM GOAL #2   Title Patient will be able to demonstrate at least 120 degrees of active shoulder flexion with her left UE.    Period Weeks    Status Achieved      PT LONG TERM GOAL #3   Title Patient will be able to demonstrate at least 100 degrees of left shoulder abduction for improved shoulder moiblity.    Time 8    Period Weeks    Status Partially Met      PT LONG TERM GOAL #4   Title Patient will able to buckle her seatbelt with her left UE for improved safety.    Period Weeks    Status Partially Met                   Plan - 12/21/21 1020     Clinical Impression Statement The patient is sore today from doing her HEP twice a day over the weekend.  She is very motivated and stays active.  Recommended she not do her HEP today and let her shoulder  recover from the soreness.    Personal Factors and Comorbidities Comorbidity 1;Comorbidity 2    Comorbidities RA, history of cancer,    Examination-Activity Limitations Bathing;Carry;Reach Overhead    Examination-Participation Restrictions Cleaning  Stability/Clinical Decision Making Evolving/Moderate complexity    Rehab Potential Good    PT Frequency 2x / week    PT Duration 8 weeks    PT Treatment/Interventions Cryotherapy;Electrical Stimulation;Moist Heat;Neuromuscular re-education;Therapeutic exercise;Therapeutic activities;Patient/family education;Manual techniques;Passive range of motion;Taping;Vasopneumatic Device    PT Next Visit Plan Review HEP, progress per protocol, continue with left shoulder PROM.  Pulleys and UE Ranger, wall ladder, ball on wall.    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

## 2021-12-25 ENCOUNTER — Other Ambulatory Visit: Payer: Self-pay

## 2021-12-25 ENCOUNTER — Encounter: Payer: Self-pay | Admitting: Physical Therapy

## 2021-12-25 ENCOUNTER — Ambulatory Visit: Payer: Medicare Other | Admitting: Physical Therapy

## 2021-12-25 DIAGNOSIS — M25612 Stiffness of left shoulder, not elsewhere classified: Secondary | ICD-10-CM

## 2021-12-25 DIAGNOSIS — M25512 Pain in left shoulder: Secondary | ICD-10-CM

## 2021-12-25 NOTE — Therapy (Signed)
[facility] [place], [place], [address] Phone: [phone]   Fax:  [phone]  Physical Therapy Treatment  Patient Details  Name: Sandra Mann MRN: [id number] Date of Birth: 1951-01-09 Referring Provider (PT): Supple   Encounter Date: 12/25/2021   PT End of Session - 12/25/21 0908     Visit Number 16    Number of Visits 24    Date for PT Re-Evaluation 01/08/22    PT Start Time 0903    PT Stop Time 0950    PT Time Calculation (min) 47 min    Activity Tolerance Patient tolerated treatment well    Behavior During Therapy [facility] for tasks assessed/performed             Past Medical History:  Diagnosis Date   Chest pain    GERD (gastroesophageal reflux disease)    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   Oral mass    left   Osteopenia    PONV (postoperative nausea and vomiting)    Rheumatoid arteritis ([place])    Rheumatoid arthritis ([place])    Ulcerative proctitis ([place])    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   Wears glasses     Past Surgical History:  Procedure Laterality Date   COLONOSCOPY     CYSTOSCOPY WITH INJECTION N/A 03/04/2021   Procedure: CYSTOSCOPY WITH INJECTION OF INDOCYANINE GREEN DYE;  Surgeon: Alexis Frock, MD;  [facility]: [facility];  Service: Urology;  Laterality: N/A;   CYSTOSCOPY WITH URETHRAL DILATATION N/A 09/23/2020   Procedure: CYSTOSCOPY WITH URETHRAL DILATATION, EXAM UNDER ANESTHESIA, BIOPSY OF URETHRAL MASS;  Surgeon: Robley Fries, MD;  [facility]: [facility];  Service: Urology;  Laterality: N/A;   EXCISION ORAL TUMOR Left 03/27/2014   Procedure: EXCISION OF ORAL CANCER;  Surgeon: Izora Gala, MD;  Location: [place];  Service: ENT;  Laterality: Left;   FINGER GANGLION CYST EXCISION Right 2007   small dip   INCISION AND DRAINAGE ABSCESS Left 06/12/2014   Procedure: LEFT INCISION AND DRAINAGE MANDIBULAR ABSCESS;  Surgeon: Izora Gala, MD;  Location: [place];   Service: ENT;  Laterality: Left;   INCISION AND DRAINAGE ABSCESS Left 11/22/2014   Procedure: INCISION AND DRAINAGE LEFT  MANDIBULAR ABSCESS;  Surgeon: Izora Gala, MD;  Location: [place];  Service: ENT;  Laterality: Left;   INCISION AND DRAINAGE OF PERITONSILLAR ABCESS Left 06/01/2014   Procedure: INCISION AND DRAINAGE Masticator Space Infection;  Surgeon: Izora Gala, MD;  Location: [place];  Service: ENT;  Laterality: Left;   IR IMAGING GUIDED PORT INSERTION  10/21/2020   LYMPH NODE DISSECTION Bilateral 03/04/2021   Procedure: LYMPH NODE DISSECTION;  Surgeon: Alexis Frock, MD;  [facility];  Service: Urology;  Laterality: Bilateral;   MULTIPLE EXTRACTIONS WITH ALVEOLOPLASTY Left 03/27/2014   Procedure: EXTRACTIONS OF NECESSARY TOOTH ;  Surgeon: Ceasar Mons, DDS;  Location: [place];  Service: Oral Surgery;  Laterality: Left;   REVERSE SHOULDER ARTHROPLASTY Left 09/03/2021   Procedure: REVERSE SHOULDER ARTHROPLASTY;  Surgeon: Justice Britain, MD;  [facility];  Service: Orthopedics;  Laterality: Left;   ROBOT ASSISTED LAPAROSCOPIC COMPLETE CYSTECT ILEAL CONDUIT N/A 03/04/2021   Procedure: XI ROBOTIC ASSISTED LAPAROSCOPIC COMPLETE CYSTECT ILEAL CONDUIT;  Surgeon: Alexis Frock, MD;  [facility];  Service: Urology;  Laterality: N/A;  6.5 HRS   ROBOTIC ASSISTED LAPAROSCOPIC HYSTERECTOMY AND SALPINGECTOMY Bilateral 03/04/2021   Procedure: XI ROBOTIC ASSISTED LAPAROSCOPIC HYSTERECTOMY AND SALPINGECTOMY;  Surgeon:  Alexis Frock, MD;  [facility];  Service: Urology;  Laterality: Bilateral;   TUBAL LIGATION  ~ 1988    There were no vitals filed for this visit.   Subjective Assessment - 12/25/21 0907     Subjective Reports more soreness and waking her up the last several mornings.    Pertinent History RA and history of cancer    Limitations Lifting    Patient Stated Goals reach overhead, wash under her other arm, buckle her seatbelt    Currently in Pain? Yes     Pain Score 4     Pain Location Shoulder    Pain Orientation Left;Anterior    Pain Descriptors / Indicators Sore    Pain Type Surgical pain    Pain Onset More than a month ago    Pain Frequency Intermittent                [facility] PT Assessment - 12/25/21 0001       Assessment   Medical Diagnosis Left RTSA    Referring Provider (PT) Supple    Onset Date/Surgical Date 09/03/21    Hand Dominance Right    Next MD Visit 11/23/2021    Prior Therapy No      Precautions   Precautions Shoulder    Type of Shoulder Precautions Reverse TSA                           [facility] Adult PT Treatment/Exercise - 12/25/21 0001       Shoulder Exercises: Standing   Protraction Strengthening;Left;20 reps;Theraband    Theraband Level (Shoulder Protraction) Level 1 (Yellow)    External Rotation Strengthening;Left;20 reps;Theraband    Theraband Level (Shoulder External Rotation) Level 1 (Yellow)    Internal Rotation Strengthening;Left;20 reps;Theraband    Theraband Level (Shoulder Internal Rotation) Level 1 (Yellow)    Extension Strengthening;Left;20 reps;Theraband    Theraband Level (Shoulder Extension) Level 1 (Yellow)    Row Strengthening;Left;20 reps;Theraband    Theraband Level (Shoulder Row) Level 1 (Yellow)      Shoulder Exercises: ROM/Strengthening   UBE (Upper Arm Bike) 90 RPM x8 min    Ball on Wall into flex x20 reps      Modalities   Modalities Electrical Stimulation;Cryotherapy      Moist Heat Therapy   Number Minutes Moist Heat --    Moist Heat Location --      Cryotherapy   Number Minutes Cryotherapy 10 Minutes    Cryotherapy Location Shoulder    Type of Cryotherapy Ice pack      Electrical Stimulation   Electrical Stimulation Location L anterior shoulder    Electrical Stimulation Action Pre-mod    Electrical Stimulation Parameters 80-150 hz x10 min    Electrical Stimulation Goals Pain      Manual Therapy   Manual Therapy Passive ROM    Passive ROM  PROM of L shoulder into flex, Er, IR with gentle holds                          PT Long Term Goals - 12/25/21 0947       PT LONG TERM GOAL #1   Title Patient will be independent with her HEP.    Time 8    Period Weeks    Status Partially Met      PT LONG TERM GOAL #2   Title Patient will be able to demonstrate at  least 120 degrees of active shoulder flexion with her left UE.    Period Weeks    Status Achieved      PT LONG TERM GOAL #3   Title Patient will be able to demonstrate at least 100 degrees of left shoulder abduction for improved shoulder moiblity.    Time 8    Period Weeks    Status Partially Met      PT LONG TERM GOAL #4   Title Patient will able to buckle her seatbelt with her left UE for improved safety.    Period Weeks    Status Achieved                   Plan - 12/25/21 0948     Clinical Impression Statement Patient progressed to some light AAROM and light strengthening activities with reports of greatest difficulty and pain with ER and IR respectively. Patient reported more soreness recently and disturbing her sleep. ER PROM end range is also limited due to pain as well as some end range flexion. Normal modalities response noted following removal of the modalities.    Personal Factors and Comorbidities Comorbidity 1;Comorbidity 2    Comorbidities RA, history of cancer,    Examination-Activity Limitations Bathing;Carry;Reach Overhead    Examination-Participation Restrictions Cleaning    Stability/Clinical Decision Making Evolving/Moderate complexity    Rehab Potential Good    PT Frequency 2x / week    PT Duration 8 weeks    PT Treatment/Interventions Cryotherapy;Electrical Stimulation;Moist Heat;Neuromuscular re-education;Therapeutic exercise;Therapeutic activities;Patient/family education;Manual techniques;Passive range of motion;Taping;Vasopneumatic Device    PT Next Visit Plan Review HEP, progress per protocol, continue with left  shoulder PROM.  Pulleys and UE Ranger, wall ladder, ball on wall.    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

## 2021-12-28 ENCOUNTER — Ambulatory Visit: Payer: Medicare Other | Admitting: Physical Therapy

## 2021-12-28 ENCOUNTER — Encounter: Payer: Self-pay | Admitting: Physical Therapy

## 2021-12-28 ENCOUNTER — Other Ambulatory Visit: Payer: Self-pay

## 2021-12-28 DIAGNOSIS — M25512 Pain in left shoulder: Secondary | ICD-10-CM

## 2021-12-28 DIAGNOSIS — M25612 Stiffness of left shoulder, not elsewhere classified: Secondary | ICD-10-CM

## 2021-12-28 NOTE — Therapy (Addendum)
[facility] [place], [place], [address] Phone: [phone]   Fax:  [phone]  Physical Therapy Treatment  Patient Details  Name: Sandra Mann MRN: [id number] Date of Birth: [birth year] Referring Provider (PT): Supple   Encounter Date: 12/28/2021   PT End of Session - 12/28/21 1028     Visit Number 17    Number of Visits 24    Date for PT Re-Evaluation 01/08/22    PT Start Time 0900    PT Stop Time 7169    PT Time Calculation (min) 44 min    Activity Tolerance Patient tolerated treatment well    Behavior During Therapy [facility] for tasks assessed/performed             Past Medical History:  Diagnosis Date   Chest pain    GERD (gastroesophageal reflux disease)    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   Oral mass    left   Osteopenia    PONV (postoperative nausea and vomiting)    Rheumatoid arteritis ([place])    Rheumatoid arthritis ([place])    Ulcerative proctitis ([place])    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   Wears glasses     Past Surgical History:  Procedure Laterality Date   COLONOSCOPY     CYSTOSCOPY WITH INJECTION N/A 03/04/2021   Procedure: CYSTOSCOPY WITH INJECTION OF INDOCYANINE GREEN DYE;  Surgeon: Alexis Frock, MD;  [facility];  Service: Urology;  Laterality: N/A;   CYSTOSCOPY WITH URETHRAL DILATATION N/A 09/23/2020   Procedure: CYSTOSCOPY WITH URETHRAL DILATATION, EXAM UNDER ANESTHESIA, BIOPSY OF URETHRAL MASS;  Surgeon: Robley Fries, MD;  [facility];  Service: Urology;  Laterality: N/A;   EXCISION ORAL TUMOR Left 03/27/2014   Procedure: EXCISION OF ORAL CANCER;  Surgeon: Izora Gala, MD;  Location: [place];  Service: ENT;  Laterality: Left;   FINGER GANGLION CYST EXCISION Right 2007   small dip   INCISION AND DRAINAGE ABSCESS Left 06/12/2014   Procedure: LEFT INCISION AND DRAINAGE MANDIBULAR ABSCESS;  Surgeon: Izora Gala, MD;  Location: [place];   Service: ENT;  Laterality: Left;   INCISION AND DRAINAGE ABSCESS Left 11/22/2014   Procedure: INCISION AND DRAINAGE LEFT  MANDIBULAR ABSCESS;  Surgeon: Izora Gala, MD;  Location: [place];  Service: ENT;  Laterality: Left;   INCISION AND DRAINAGE OF PERITONSILLAR ABCESS Left 06/01/2014   Procedure: INCISION AND DRAINAGE Masticator Space Infection;  Surgeon: Izora Gala, MD;  Location: [place];  Service: ENT;  Laterality: Left;   IR IMAGING GUIDED PORT INSERTION  10/21/2020   LYMPH NODE DISSECTION Bilateral 03/04/2021   Procedure: LYMPH NODE DISSECTION;  Surgeon: Alexis Frock, MD;  [facility];  Service: Urology;  Laterality: Bilateral;   MULTIPLE EXTRACTIONS WITH ALVEOLOPLASTY Left 03/27/2014   Procedure: EXTRACTIONS OF NECESSARY TOOTH ;  Surgeon: Ceasar Mons, DDS;  Location: [place];  Service: Oral Surgery;  Laterality: Left;   REVERSE SHOULDER ARTHROPLASTY Left 09/03/2021   Procedure: REVERSE SHOULDER ARTHROPLASTY;  Surgeon: Justice Britain, MD;  [facility];  Service: Orthopedics;  Laterality: Left;   ROBOT ASSISTED LAPAROSCOPIC COMPLETE CYSTECT ILEAL CONDUIT N/A 03/04/2021   Procedure: XI ROBOTIC ASSISTED LAPAROSCOPIC COMPLETE CYSTECT ILEAL CONDUIT;  Surgeon: Alexis Frock, MD;  [facility];  Service: Urology;  Laterality: N/A;  6.5 HRS   ROBOTIC ASSISTED LAPAROSCOPIC HYSTERECTOMY AND SALPINGECTOMY Bilateral 03/04/2021   Procedure: XI ROBOTIC ASSISTED LAPAROSCOPIC HYSTERECTOMY AND SALPINGECTOMY;  Surgeon:  Alexis Frock, MD;  [facility];  Service: Urology;  Laterality: Bilateral;   TUBAL LIGATION  ~ 1988    There were no vitals filed for this visit.   Subjective Assessment - 12/28/21 0940     Subjective COVID-19 screen performed prior to patient entering clinic.  No new complaints.    Pertinent History RA and history of cancer    Limitations Lifting    Patient Stated Goals reach overhead, wash under her other arm, buckle her seatbelt    Currently in  Pain? Yes    Pain Score 4     Pain Location Shoulder    Pain Descriptors / Indicators Sore    Pain Onset More than a month ago                               [facility] Adult PT Treatment/Exercise - 12/28/21 0001       Exercises   Exercises Shoulder      Shoulder Exercises: Supine   Other Supine Exercises 1# punches to fatigue x 2 f/b rhy stabs CW and CCW to fatigue.      Shoulder Exercises: Sidelying   Other Sidelying Exercises Non-resisted SDLY ER to fatigue x 2.      Shoulder Exercises: Standing   Other Standing Exercises UE Ranger on wall x 4 minutes into flexion and circles (CCW and CW) f/b RW 4 with yellow therband 2 sets each direction f/b 1# flexion to 80 degrees to fatigue x 2.    Other Standing Exercises Ball on wall x 2 minutes.      Shoulder Exercises: ROM/Strengthening   UBE (Upper Arm Bike) 90 RPM's x 8 minutes.      Modalities   Modalities Health visitor Stimulation Location Left shoulder.    Electrical Stimulation Action Pre-mod.    Electrical Stimulation Parameters 80-150 Hz x 15 minutes.    Electrical Stimulation Goals Pain      Vasopneumatic   Number Minutes Vasopneumatic  15 minutes    Vasopnuematic Location  --   Left shoulder with pillow between thorax and left elbow.   Vasopneumatic Pressure Low                          PT Long Term Goals - 12/25/21 0947       PT LONG TERM GOAL #1   Title Patient will be independent with her HEP.    Time 8    Period Weeks    Status Partially Met      PT LONG TERM GOAL #2   Title Patient will be able to demonstrate at least 120 degrees of active shoulder flexion with her left UE.    Period Weeks    Status Achieved      PT LONG TERM GOAL #3   Title Patient will be able to demonstrate at least 100 degrees of left shoulder abduction for improved shoulder moiblity.    Time 8    Period Weeks    Status Partially Met       PT LONG TERM GOAL #4   Title Patient will able to buckle her seatbelt with her left UE for improved safety.    Period Weeks    Status Achieved                   Plan -  12/28/21 1107     Clinical Impression Statement Patient did well with light strengthening today.  [facility] ER performed without resistance.    Personal Factors and Comorbidities Comorbidity 1;Comorbidity 2    Comorbidities RA, history of cancer,    Examination-Activity Limitations Bathing;Carry;Reach Overhead    Examination-Participation Restrictions Cleaning    Stability/Clinical Decision Making Evolving/Moderate complexity    Rehab Potential Good    PT Frequency 2x / week    PT Duration 8 weeks    PT Next Visit Plan Review HEP, progress per protocol, continue with left shoulder PROM.  Pulleys and UE Ranger, wall ladder, ball on wall.             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

## 2021-12-30 ENCOUNTER — Encounter: Payer: Self-pay | Admitting: Physical Therapy

## 2021-12-30 ENCOUNTER — Ambulatory Visit: Payer: Medicare Other | Attending: Orthopedic Surgery | Admitting: Physical Therapy

## 2021-12-30 ENCOUNTER — Other Ambulatory Visit: Payer: Self-pay

## 2021-12-30 DIAGNOSIS — M25612 Stiffness of left shoulder, not elsewhere classified: Secondary | ICD-10-CM | POA: Diagnosis present

## 2021-12-30 DIAGNOSIS — M25512 Pain in left shoulder: Secondary | ICD-10-CM | POA: Insufficient documentation

## 2021-12-30 NOTE — Therapy (Signed)
[facility] [place], [place], [address] Phone: [phone]   Fax:  [phone]  Physical Therapy Treatment  Patient Details  Name: Sandra Mann MRN: [id number] Date of Birth: [birth year] Referring Provider (PT): Supple   Encounter Date: 12/30/2021   PT End of Session - 12/30/21 1002     Visit Number 18    Number of Visits 24    Date for PT Re-Evaluation 01/08/22    PT Start Time 0956    PT Stop Time 1028    PT Time Calculation (min) 32 min    Activity Tolerance Patient tolerated treatment well    Behavior During Therapy [facility] for tasks assessed/performed             Past Medical History:  Diagnosis Date   Chest pain    GERD (gastroesophageal reflux disease)    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   Oral mass    left   Osteopenia    PONV (postoperative nausea and vomiting)    Rheumatoid arteritis ([place])    Rheumatoid arthritis ([place])    Ulcerative proctitis ([place])    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   Wears glasses     Past Surgical History:  Procedure Laterality Date   COLONOSCOPY     CYSTOSCOPY WITH INJECTION N/A 03/04/2021   Procedure: CYSTOSCOPY WITH INJECTION OF INDOCYANINE GREEN DYE;  Surgeon: Alexis Frock, MD;  Location: [facility];  Service: Urology;  Laterality: N/A;   CYSTOSCOPY WITH URETHRAL DILATATION N/A 09/23/2020   Procedure: CYSTOSCOPY WITH URETHRAL DILATATION, EXAM UNDER ANESTHESIA, BIOPSY OF URETHRAL MASS;  Surgeon: Robley Fries, MD;  Location: [facility];  Service: Urology;  Laterality: N/A;   EXCISION ORAL TUMOR Left 03/27/2014   Procedure: EXCISION OF ORAL CANCER;  Surgeon: Izora Gala, MD;  Location: [facility];  Service: ENT;  Laterality: Left;   FINGER GANGLION CYST EXCISION Right 2007   small dip   INCISION AND DRAINAGE ABSCESS Left 06/12/2014   Procedure: LEFT INCISION AND DRAINAGE MANDIBULAR ABSCESS;  Surgeon: Izora Gala, MD;  Location: [facility];   Service: ENT;  Laterality: Left;   INCISION AND DRAINAGE ABSCESS Left 11/22/2014   Procedure: INCISION AND DRAINAGE LEFT  MANDIBULAR ABSCESS;  Surgeon: Izora Gala, MD;  Location: [facility];  Service: ENT;  Laterality: Left;   INCISION AND DRAINAGE OF PERITONSILLAR ABCESS Left 06/01/2014   Procedure: INCISION AND DRAINAGE Masticator Space Infection;  Surgeon: Izora Gala, MD;  Location: [facility];  Service: ENT;  Laterality: Left;   IR IMAGING GUIDED PORT INSERTION  10/21/2020   LYMPH NODE DISSECTION Bilateral 03/04/2021   Procedure: LYMPH NODE DISSECTION;  Surgeon: Alexis Frock, MD;  [facility];  Service: Urology;  Laterality: Bilateral;   MULTIPLE EXTRACTIONS WITH ALVEOLOPLASTY Left 03/27/2014   Procedure: EXTRACTIONS OF NECESSARY TOOTH ;  Surgeon: Ceasar Mons, DDS;  Location: [facility];  Service: Oral Surgery;  Laterality: Left;   REVERSE SHOULDER ARTHROPLASTY Left 09/03/2021   Procedure: REVERSE SHOULDER ARTHROPLASTY;  Surgeon: Justice Britain, MD;  [facility];  Service: Orthopedics;  Laterality: Left;   ROBOT ASSISTED LAPAROSCOPIC COMPLETE CYSTECT ILEAL CONDUIT N/A 03/04/2021   Procedure: XI ROBOTIC ASSISTED LAPAROSCOPIC COMPLETE CYSTECT ILEAL CONDUIT;  Surgeon: Alexis Frock, MD;  [facility];  Service: Urology;  Laterality: N/A;  6.5 HRS   ROBOTIC ASSISTED LAPAROSCOPIC HYSTERECTOMY AND SALPINGECTOMY Bilateral 03/04/2021   Procedure: XI ROBOTIC ASSISTED LAPAROSCOPIC HYSTERECTOMY AND SALPINGECTOMY;  Surgeon:  Alexis Frock, MD;  [facility];  Service: Urology;  Laterality: Bilateral;   TUBAL LIGATION  ~ 1988    There were no vitals filed for this visit.   Subjective Assessment - 12/30/21 1001     Subjective COVID-19 screen performed prior to patient entering clinic. Reports soreness.    Pertinent History RA and history of cancer    Limitations Lifting    Patient Stated Goals reach overhead, wash under her other arm, buckle her seatbelt    Currently in Pain?  Yes    Pain Score 4     Pain Location Shoulder    Pain Orientation Left;Lateral    Pain Descriptors / Indicators Sore    Pain Type Surgical pain    Pain Onset More than a month ago    Pain Frequency Intermittent                [facility] PT Assessment - 12/30/21 0001       Assessment   Medical Diagnosis Left RTSA    Referring Provider (PT) Supple    Onset Date/Surgical Date 09/03/21    Hand Dominance Right    Next MD Visit 01/2022    Prior Therapy No      Precautions   Precautions Shoulder    Type of Shoulder Precautions Reverse TSA                           [facility] Adult PT Treatment/Exercise - 12/30/21 0001       Shoulder Exercises: Supine   Protraction Strengthening;Left;20 reps;Weights    Protraction Weight (lbs) 1    Flexion Strengthening;Left;20 reps;Weights    Shoulder Flexion Weight (lbs) 1      Shoulder Exercises: Standing   Protraction Strengthening;Left;20 reps;Theraband    Theraband Level (Shoulder Protraction) Level 1 (Yellow)    External Rotation Strengthening;Left;20 reps;Theraband    Theraband Level (Shoulder External Rotation) Level 1 (Yellow)    Internal Rotation Strengthening;Left;20 reps;Theraband    Theraband Level (Shoulder Internal Rotation) Level 1 (Yellow)    Flexion Strengthening;Left;15 reps;Weights    Shoulder Flexion Weight (lbs) 1    Flexion Limitations to low and mid cabinet    ABduction AROM;Left;20 reps    Extension Strengthening;Left;20 reps;Theraband    Theraband Level (Shoulder Extension) Level 2 (Red)    Row Strengthening;Left;20 reps;Theraband    Theraband Level (Shoulder Row) Level 2 (Red)      Shoulder Exercises: ROM/Strengthening   UBE (Upper Arm Bike) 120 RPM's x 8 minutes.    Ranger standing; flex x2 min, CW x2 min      Modalities   Modalities Psychologist, educational Location L shoulder    Electrical Stimulation Action Pre-Mod     Electrical Stimulation Parameters 80-150 hz x10 min    Electrical Stimulation Goals Pain      Vasopneumatic   Number Minutes Vasopneumatic  10 minutes    Vasopnuematic Location  Shoulder    Vasopneumatic Pressure Low    Vasopneumatic Temperature  34                          PT Long Term Goals - 12/25/21 0947       PT LONG TERM GOAL #1   Title Patient will be independent with her HEP.    Time 8    Period Weeks    Status Partially Met  PT LONG TERM GOAL #2   Title Patient will be able to demonstrate at least 120 degrees of active shoulder flexion with her left UE.    Period Weeks    Status Achieved      PT LONG TERM GOAL #3   Title Patient will be able to demonstrate at least 100 degrees of left shoulder abduction for improved shoulder moiblity.    Time 8    Period Weeks    Status Partially Met      PT LONG TERM GOAL #4   Title Patient will able to buckle her seatbelt with her left UE for improved safety.    Period Weeks    Status Achieved                   Plan - 12/30/21 1121     Clinical Impression Statement Patient presented in clinic with only soreness reported in L shoulder. Patient progressed to more functional strengthening with only intermittant rest breaks. Patient functional in reaching to cabinets with 1# weight to simulate dinnerware. ER limited via weakness and discomfort with resistance. Normal modalities response noted following removal of the modalities.    Personal Factors and Comorbidities Comorbidity 1;Comorbidity 2    Comorbidities RA, history of cancer,    Examination-Activity Limitations Bathing;Carry;Reach Overhead    Examination-Participation Restrictions Cleaning    Stability/Clinical Decision Making Evolving/Moderate complexity    Rehab Potential Good    PT Frequency 2x / week    PT Duration 8 weeks    PT Treatment/Interventions Cryotherapy;Electrical Stimulation;Moist Heat;Neuromuscular re-education;Therapeutic  exercise;Therapeutic activities;Patient/family education;Manual techniques;Passive range of motion;Taping;Vasopneumatic Device    PT Next Visit Plan Review HEP, progress per protocol, continue with left shoulder PROM.  Pulleys and UE Ranger, wall ladder, ball on wall.    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

## 2021-12-31 ENCOUNTER — Inpatient Hospital Stay: Payer: Medicare Other | Admitting: Oncology

## 2021-12-31 ENCOUNTER — Inpatient Hospital Stay: Payer: Medicare Other | Attending: Oncology

## 2021-12-31 ENCOUNTER — Inpatient Hospital Stay: Payer: Medicare Other

## 2021-12-31 ENCOUNTER — Ambulatory Visit: Payer: Medicare Other

## 2021-12-31 ENCOUNTER — Other Ambulatory Visit: Payer: Medicare Other

## 2021-12-31 ENCOUNTER — Other Ambulatory Visit: Payer: Self-pay | Admitting: *Deleted

## 2021-12-31 ENCOUNTER — Ambulatory Visit: Payer: Medicare Other | Admitting: Oncology

## 2021-12-31 VITALS — BP 120/50 | HR 64 | Temp 97.4°F | Resp 18 | Wt 117.3 lb

## 2021-12-31 DIAGNOSIS — Z79899 Other long term (current) drug therapy: Secondary | ICD-10-CM | POA: Insufficient documentation

## 2021-12-31 DIAGNOSIS — M069 Rheumatoid arthritis, unspecified: Secondary | ICD-10-CM | POA: Insufficient documentation

## 2021-12-31 DIAGNOSIS — Z95828 Presence of other vascular implants and grafts: Secondary | ICD-10-CM

## 2021-12-31 DIAGNOSIS — C680 Malignant neoplasm of urethra: Secondary | ICD-10-CM

## 2021-12-31 DIAGNOSIS — Z5112 Encounter for antineoplastic immunotherapy: Secondary | ICD-10-CM | POA: Insufficient documentation

## 2021-12-31 LAB — CMP (CANCER CENTER ONLY)
ALT: 13 U/L (ref 0–44)
AST: 25 U/L (ref 15–41)
Albumin: 4.1 g/dL (ref 3.5–5.0)
Alkaline Phosphatase: 72 U/L (ref 38–126)
Anion gap: 6 (ref 5–15)
BUN: 11 mg/dL (ref 8–23)
CO2: 27 mmol/L (ref 22–32)
Calcium: 9.3 mg/dL (ref 8.9–10.3)
Chloride: 104 mmol/L (ref 98–111)
Creatinine: 0.71 mg/dL (ref 0.44–1.00)
GFR, Estimated: >60 mL/min (ref >60)
Glucose, Bld: 89 mg/dL (ref 70–99)
Potassium: 3.7 mmol/L (ref 3.5–5.1)
Sodium: 137 mmol/L (ref 135–145)
Total Bilirubin: 1 mg/dL (ref 0.3–1.2)
Total Protein: 6.8 g/dL (ref 6.5–8.1)

## 2021-12-31 LAB — CBC WITH DIFFERENTIAL (CANCER CENTER ONLY)
Abs Immature Granulocytes: 0.01 10*3/uL (ref 0.00–0.07)
Basophils Absolute: 0 10*3/uL (ref 0.0–0.1)
Basophils Relative: 1 %
Eosinophils Absolute: 0.1 10*3/uL (ref 0.0–0.5)
Eosinophils Relative: 2 %
HCT: 33.9 % — ABNORMAL LOW (ref 36.0–46.0)
Hemoglobin: 11.5 g/dL — ABNORMAL LOW (ref 12.0–15.0)
Immature Granulocytes: 0 %
Lymphocytes Relative: 27 %
Lymphs Abs: 1.5 10*3/uL (ref 0.7–4.0)
MCH: 32.5 pg (ref 26.0–34.0)
MCHC: 33.9 g/dL (ref 30.0–36.0)
MCV: 95.8 fL (ref 80.0–100.0)
Monocytes Absolute: 0.7 10*3/uL (ref 0.1–1.0)
Monocytes Relative: 12 %
Neutro Abs: 3.4 10*3/uL (ref 1.7–7.7)
Neutrophils Relative %: 58 %
Platelet Count: 234 10*3/uL (ref 150–400)
RBC: 3.54 MIL/uL — ABNORMAL LOW (ref 3.87–5.11)
RDW: 14.2 % (ref 11.5–15.5)
WBC Count: 5.7 10*3/uL (ref 4.0–10.5)
nRBC: 0 % (ref 0.0–0.2)

## 2021-12-31 LAB — TSH: TSH: 1.159 u[IU]/mL (ref 0.308–3.960)

## 2021-12-31 MED ORDER — SODIUM CHLORIDE 0.9% FLUSH
10.0000 mL | INTRAVENOUS | Status: DC | PRN
  Administered 2021-12-31: 10 mL

## 2021-12-31 MED ORDER — HEPARIN SOD (PORK) LOCK FLUSH 100 UNIT/ML IV SOLN
500.0000 [IU] | Freq: Once | INTRAVENOUS | Status: AC | PRN
  Administered 2021-12-31: 500 [IU]

## 2021-12-31 MED ORDER — SODIUM CHLORIDE 0.9 % IV SOLN: Freq: Once | INTRAVENOUS | Status: AC

## 2021-12-31 MED ORDER — SODIUM CHLORIDE 0.9% FLUSH
10.0000 mL | Freq: Once | INTRAVENOUS | Status: AC
  Administered 2021-12-31: 10 mL

## 2021-12-31 MED ORDER — SODIUM CHLORIDE 0.9 % IV SOLN
480.0000 mg | Freq: Once | INTRAVENOUS | Status: AC
  Administered 2021-12-31: 480 mg via INTRAVENOUS
  Filled 2021-12-31: qty 48

## 2021-12-31 NOTE — Patient Instructions (Signed)
[facility]  Discharge Instructions: ?Thank you for choosing [facility] to provide your oncology and hematology care.  ? ?If you have a lab appointment with the [facility], please go directly to the [facility] and check in at the registration area. ?  ?Wear comfortable clothing and clothing appropriate for easy access to any Portacath or PICC line.  ? ?We strive to give you quality time with your provider. You may need to reschedule your appointment if you arrive late (15 or more minutes).  Arriving late affects you and other patients whose appointments are after yours.  Also, if you miss three or more appointments without notifying the office, you may be dismissed from the clinic at the provider?s discretion.    ?  ?For prescription refill requests, have your pharmacy contact our office and allow 72 hours for refills to be completed.   ? ?Today you received the following chemotherapy and/or immunotherapy agents Opdivo    ?  ?To help prevent nausea and vomiting after your treatment, we encourage you to take your nausea medication as directed. ? ?BELOW ARE SYMPTOMS THAT SHOULD BE REPORTED IMMEDIATELY: ?*FEVER GREATER THAN 100.4 F (38 ?C) OR HIGHER ?*CHILLS OR SWEATING ?*NAUSEA AND VOMITING THAT IS NOT CONTROLLED WITH YOUR NAUSEA MEDICATION ?*UNUSUAL SHORTNESS OF BREATH ?*UNUSUAL BRUISING OR BLEEDING ?*URINARY PROBLEMS (pain or burning when urinating, or frequent urination) ?*BOWEL PROBLEMS (unusual diarrhea, constipation, pain near the anus) ?TENDERNESS IN MOUTH AND THROAT WITH OR WITHOUT PRESENCE OF ULCERS (sore throat, sores in mouth, or a toothache) ?UNUSUAL RASH, SWELLING OR PAIN  ?UNUSUAL VAGINAL DISCHARGE OR ITCHING  ? ?Items with * indicate a potential emergency and should be followed up as soon as possible or go to the Emergency Department if any problems should occur. ? ?Please show the CHEMOTHERAPY ALERT CARD or IMMUNOTHERAPY ALERT CARD at check-in to the  Emergency Department and triage nurse. ? ?Should you have questions after your visit or need to cancel or reschedule your appointment, please contact [facility]  Dept: [phone]  and follow the prompts.  Office hours are 8:00 a.m. to 4:30 p.m. Monday - Friday. Please note that voicemails left after 4:00 p.m. may not be returned until the following business day.  We are closed weekends and major holidays. You have access to a nurse at all times for urgent questions. Please call the main number to the clinic Dept: [phone] and follow the prompts. ? ? ?For any non-urgent questions, you may also contact your provider using MyChart. We now offer e-Visits for anyone 23 and older to request care online for non-urgent symptoms. For details visit [email or web address]. ?  ?Also download the MyChart app! Go to the app store, search "MyChart", open the app, select [facility], and log in with your MyChart username and password. ? ?Due to Covid, a mask is required upon entering the hospital/clinic. If you do not have a mask, one will be given to you upon arrival. For doctor visits, patients may have 1 support person aged 70 or older with them. For treatment visits, patients cannot have anyone with them due to current Covid guidelines and our immunocompromised population.  ? ?

## 2021-12-31 NOTE — Progress Notes (Signed)
Hematology and Oncology Follow Up Visit ? ?Sandra Mann ?[id number] ?[birth year] 71 y.o. ?12/31/2021 12:00 PM ?Sandra Mann, MDEksir, Sandra Conroy, MD  ? 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  She developed T4N1 high-grade urothelial carcinoma at that time. ? ?Prior Therapy: ?She is status post cystoscopy and biopsy in November 2021 which showed high-grade urothelial carcinoma. ?Neoadjuvant chemotherapy utilizing gemcitabine and cisplatin started on October 29, 2020.  She completed 4 cycles of therapy on January 07, 2021. ?She is status post robotic assisted laparoscopic cystectomy, hysterectomy and lymph node dissection completed on Mar 04, 2021 completed by Dr. Tammi Klippel.  He final pathology showed T4N1 disease.  She had 1 out of 16 lymph node involvement. ? ?Current therapy: Nivolumab 480 mg every 4 weeks started in July 2022.  She is here for cycle 8 of therapy.  ? ? ?Interim History: Ms. [name] returns today for a follow-up visit.  Since last visit, she reports no Mann changes in her health.  She tolerated nivolumab without any complaints.  She denies any nausea, vomiting or abdominal pain.  She denies any arthralgias or myalgias.  She denies dyspnea exertion or changes in her bowels. ? ?Medications: Updated on review. ?Current Outpatient Medications  ?Medication Sig Dispense Refill  ? acetaminophen (TYLENOL) 500 MG tablet Take 500 mg by mouth every 6 (six) hours as needed for moderate pain.    ? cyclobenzaprine (FLEXERIL) 10 MG tablet Take 1 tablet (10 mg total) by mouth 3 (three) times daily as needed for muscle spasms. 30 tablet 1  ? cycloSPORINE (RESTASIS) 0.05 % ophthalmic emulsion Place 1 drop into both eyes 2 (two) times daily.    ? folic acid (FOLVITE) 203 MCG tablet Take 1,600 mcg by mouth daily.     ? lidocaine-prilocaine (EMLA) cream Apply 1 application topically as needed. (Patient taking differently: Apply 1 application topically as needed (for the  port).) 30 g 0  ? Melatonin 10 MG TABS Take 10 mg by mouth at bedtime.    ? mesalamine (CANASA) 1000 MG suppository Place 1,000 mg rectally every other day. At bedtime    ? methotrexate (RHEUMATREX) 2.5 MG tablet Take 20 mg by mouth every Tuesday.    ? Multiple Vitamin (MULTIVITAMIN WITH MINERALS) TABS tablet Take 1 tablet by mouth at bedtime. Silver    ? naproxen (NAPROSYN) 500 MG tablet Take 1 tablet (500 mg total) by mouth 2 (two) times daily with a meal. 60 tablet 1  ? omeprazole (PRILOSEC) 40 MG capsule Take 40 mg by mouth daily.    ? ondansetron (ZOFRAN) 4 MG tablet Take 1 tablet (4 mg total) by mouth every 8 (eight) hours as needed for nausea or vomiting. (Patient not taking: Reported on 09/28/2021) 10 tablet 0  ? oxyCODONE-acetaminophen (PERCOCET) 5-325 MG tablet Take 1 tablet by mouth every 4 (four) hours as needed (max 6 q). (Patient not taking: Reported on 09/28/2021) 20 tablet 0  ? traMADol (ULTRAM) 50 MG tablet Take 1-2 tablets (50-100 mg total) by mouth every 6 (six) hours as needed for moderate pain. (Patient not taking: Reported on 09/28/2021) 20 tablet 0  ? valACYclovir (VALTREX) 500 MG tablet Take 1 tablet (500 mg total) by mouth daily. 14 tablet 1  ? ?No current facility-administered medications for this visit.  ? ? ? ?Allergies:  ?Allergies  ?Allergen Reactions  ? Codeine Nausea And Vomiting  ? Sulfa Antibiotics Nausea And Vomiting  ? Vicodin [Hydrocodone-Acetaminophen] Nausea And Vomiting  ? ? ? ? ?  Physical Exam: ? ? ? ? ? ? ? ?Blood pressure (!) 120/50, pulse 64, temperature (!) 97.4 ?F (36.3 ?C), resp. rate 18, weight 117 lb 4.8 oz (53.2 kg), SpO2 100 %. ? ? ? ? ?ECOG: 0 ? ? ? ?General appearance: Comfortable appearing without any discomfort ?Head: Normocephalic without any trauma ?Oropharynx: Mucous membranes are moist and pink without any thrush or ulcers. ?Eyes: Pupils are equal and round reactive to light. ?Lymph nodes: No cervical, supraclavicular, inguinal or axillary lymphadenopathy.    ?Heart:regular rate and rhythm.  S1 and S2 without leg edema. ?Lung: Clear without any rhonchi or wheezes.  No dullness to percussion. ?Abdomin: Soft, nontender, nondistended with good bowel sounds.  No hepatosplenomegaly. ?Musculoskeletal: No joint deformity or effusion.  Full range of motion noted. ?Neurological: No deficits noted on motor, sensory and deep tendon reflex exam. ?Skin: No petechial rash or dryness.  Appeared moist.  ? ? ? ? ? ? ? ? ? ? ? ? ?Lab Results: ?Lab Results  ?Component Value Date  ? WBC 5.9 12/03/2021  ? HGB 10.7 (L) 12/03/2021  ? HCT 31.0 (L) 12/03/2021  ? MCV 93.9 12/03/2021  ? PLT 227 12/03/2021  ? ?  Chemistry   ?   ?Component Value Date/Time  ? NA 137 12/03/2021 0954  ? K 3.8 12/03/2021 0954  ? CL 104 12/03/2021 0954  ? CO2 27 12/03/2021 0954  ? BUN 14 12/03/2021 0954  ? CREATININE 0.75 12/03/2021 0954  ?    ?Component Value Date/Time  ? CALCIUM 9.0 12/03/2021 0954  ? ALKPHOS 86 12/03/2021 0954  ? AST 30 12/03/2021 0954  ? ALT 18 12/03/2021 0954  ? BILITOT 0.7 12/03/2021 0954  ?  ? ? ? ? ?Impression and Plan: ? ?71 year old woman with: ?  ?1.  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 ? ? ?Her disease status was updated at this time and treatment versus were reviewed.  She continues to receive adjuvant nivolumab after surgical resection and currently has tolerated very well.  Plan is to continue with current therapy and update her staging scans in April 2023.  The plan is to continue nivolumab to complete 1 year of therapy.  She is agreeable to proceed. ? ? ?2.  IV access: Port-A-Cath remains in place and in use currently. ?  ?3.  Antiemetics: No nausea or vomiting reported at this time. ?  ?4.  Immune mediated complications: I continue to educate about potential complication clued pneumonitis, colitis and thyroid disease. ? ? ?5.  Goals of care: If he remains curative at this time and aggressive measures are warranted. ? ? ? 6.  Follow-up: In 1 month for  repeat follow-up.. ?  ?30  minutes were spent on this visit.  The time was dedicated to reviewing her disease status, treatment choices and addressing complication related to her cancer and cancer therapy ? ?[name], MD ?3/2/202312:00 PM ? ?

## 2022-01-01 ENCOUNTER — Ambulatory Visit: Payer: Medicare Other

## 2022-01-01 ENCOUNTER — Other Ambulatory Visit: Payer: Medicare Other

## 2022-01-01 ENCOUNTER — Ambulatory Visit: Payer: Medicare Other | Admitting: Oncology

## 2022-01-05 ENCOUNTER — Other Ambulatory Visit: Payer: Self-pay

## 2022-01-05 ENCOUNTER — Ambulatory Visit: Payer: Medicare Other | Admitting: *Deleted

## 2022-01-05 DIAGNOSIS — M25512 Pain in left shoulder: Secondary | ICD-10-CM | POA: Diagnosis not present

## 2022-01-05 DIAGNOSIS — M25612 Stiffness of left shoulder, not elsewhere classified: Secondary | ICD-10-CM

## 2022-01-05 NOTE — Therapy (Signed)
[facility] ?[facility] ?[address] ?[place], [place], [address] ?Phone: [phone]   Fax:  [phone] ? ?Physical Therapy Treatment ? ?Patient Details  ?Name: Sandra Mann ?MRN: [id number] ?Date of Birth: 30-Apr-1951 ?Referring Provider (PT): Supple ? ? ?Encounter Date: 01/05/2022 ? ? PT End of Session - 01/05/22 0955   ? ? Visit Number 19   ? Number of Visits 24   ? Date for PT Re-Evaluation 01/08/22   ? PT Start Time 2979   ? PT Stop Time 1035   ? PT Time Calculation (min) 50 min   ? ?  ?  ? ?  ? ? ?Past Medical History:  ?Diagnosis Date  ? Chest pain   ? GERD (gastroesophageal reflux disease)   ? 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  ? Oral mass   ? left  ? Osteopenia   ? PONV (postoperative nausea and vomiting)   ? Rheumatoid arteritis ([place])   ? Rheumatoid arthritis ([place])   ? Ulcerative proctitis ([place])   ? 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 Wears glasses   ? ? ?Past Surgical History:  ?Procedure Laterality Date  ? COLONOSCOPY    ? CYSTOSCOPY WITH INJECTION N/A 03/04/2021  ? Procedure: CYSTOSCOPY WITH INJECTION OF INDOCYANINE GREEN DYE;  Surgeon: Alexis Frock, MD;  [facility];  Service: Urology;  Laterality: N/A;  ? CYSTOSCOPY WITH URETHRAL DILATATION N/A 09/23/2020  ? Procedure: CYSTOSCOPY WITH URETHRAL DILATATION, EXAM UNDER ANESTHESIA, BIOPSY OF URETHRAL MASS;  Surgeon: Robley Fries, MD;  [facility];  Service: Urology;  Laterality: N/A;  ? EXCISION ORAL TUMOR Left 03/27/2014  ? Procedure: EXCISION OF ORAL CANCER;  Surgeon: Izora Gala, MD;  Location: [facility];  Service: ENT;  Laterality: Left;  ? FINGER GANGLION CYST EXCISION Right 2007  ? small dip  ? INCISION AND DRAINAGE ABSCESS Left 06/12/2014  ? Procedure: LEFT INCISION AND DRAINAGE MANDIBULAR ABSCESS;  Surgeon: Izora Gala, MD;  Location: [facility];  Service: ENT;  Laterality: Left;  ? INCISION AND DRAINAGE ABSCESS Left 11/22/2014  ? Procedure: INCISION AND DRAINAGE LEFT   MANDIBULAR ABSCESS;  Surgeon: Izora Gala, MD;  Location: [facility];  Service: ENT;  Laterality: Left;  ? INCISION AND DRAINAGE OF PERITONSILLAR ABCESS Left 06/01/2014  ? Procedure: INCISION AND DRAINAGE Masticator Space Infection;  Surgeon: Izora Gala, MD;  Location: [facility];  Service: ENT;  Laterality: Left;  ? IR IMAGING GUIDED PORT INSERTION  10/21/2020  ? LYMPH NODE DISSECTION Bilateral 03/04/2021  ? Procedure: LYMPH NODE DISSECTION;  Surgeon: Alexis Frock, MD;  [facility];  Service: Urology;  Laterality: Bilateral;  ? MULTIPLE EXTRACTIONS WITH ALVEOLOPLASTY Left 03/27/2014  ? Procedure: EXTRACTIONS OF NECESSARY TOOTH ;  Surgeon: Ceasar Mons, DDS;  Location: [facility];  Service: Oral Surgery;  Laterality: Left;  ? REVERSE SHOULDER ARTHROPLASTY Left 09/03/2021  ? Procedure: REVERSE SHOULDER ARTHROPLASTY;  Surgeon: Justice Britain, MD;  [facility];  Service: Orthopedics;  Laterality: Left;  ? ROBOT ASSISTED LAPAROSCOPIC COMPLETE CYSTECT ILEAL CONDUIT N/A 03/04/2021  ? Procedure: XI ROBOTIC ASSISTED LAPAROSCOPIC COMPLETE CYSTECT ILEAL CONDUIT;  Surgeon: Alexis Frock, MD;  [facility];  Service: Urology;  Laterality: N/A;  6.5 HRS  ? ROBOTIC ASSISTED LAPAROSCOPIC HYSTERECTOMY AND SALPINGECTOMY Bilateral 03/04/2021  ? Procedure: XI ROBOTIC ASSISTED LAPAROSCOPIC HYSTERECTOMY AND SALPINGECTOMY;  Surgeon: Alexis Frock, MD;  [facility];  Service: Urology;  Laterality: Bilateral;  ? TUBAL LIGATION  ~  1988  ? ? ?There were no vitals filed for this visit. ? ? Subjective Assessment - 01/05/22 0954   ? ? Subjective COVID-19 screen performed prior to patient entering clinic. Reports soreness and stiffness LT shldr   ? Pertinent History RA and history of cancer   ? Limitations Lifting   ? Patient Stated Goals reach overhead, wash under her other arm, buckle her seatbelt   ? Currently in Pain? Yes   ? Pain Score 2    ? Pain Location Shoulder   ? Pain Orientation Right   ? Pain Descriptors /  Indicators Aching;Sore   ? ?  ?  ? ?  ? ? ? ? ? ? ? ? ? ? ? ? ? ? ? ? ? ? ? ? [facility] Adult PT Treatment/Exercise - 01/05/22 0001   ? ?  ? Exercises  ? Exercises Shoulder   ?  ? Shoulder Exercises: Standing  ? Protraction Strengthening;Left;20 reps;Theraband   ? Theraband Level (Shoulder Protraction) Level 1 (Yellow)   ? External Rotation Strengthening;Left;20 reps;Theraband   ? Theraband Level (Shoulder External Rotation) Level 1 (Yellow)   ? Internal Rotation Strengthening;Left;20 reps;Theraband   ? Theraband Level (Shoulder Internal Rotation) Level 1 (Yellow)   ? Row Strengthening;Left;20 reps;Theraband   ? Theraband Level (Shoulder Row) Level 1 (Yellow)   ?  ? Shoulder Exercises: ROM/Strengthening  ? UBE (Upper Arm Bike) 120 RPM's x 8 minutes.   ?  ? Modalities  ? Modalities Electrical Stimulation;Vasopneumatic   ?  ? Electrical Stimulation  ? Electrical Stimulation Location L shoulder   ? Electrical Stimulation Action premod   ? Electrical Stimulation Parameters 80-150hz  x15 mins   ? Electrical Stimulation Goals Pain   ?  ? Vasopneumatic  ? Number Minutes Vasopneumatic  15 minutes   ? Vasopnuematic Location  Shoulder   ? Vasopneumatic Pressure Low   ? Vasopneumatic Temperature  34   ?  ? Manual Therapy  ? Soft tissue mobilization STW/ TPR  to LT shldr posteriolateral aspect   ? ?  ?  ? ?  ? ? ? ? ? ? ? ? ? ? ? ? ? ? ? PT Long Term Goals - 12/25/21 0947   ? ?  ? PT LONG TERM GOAL #1  ? Title Patient will be independent with her HEP.   ? Time 8   ? Period Weeks   ? Status Partially Met   ?  ? PT LONG TERM GOAL #2  ? Title Patient will be able to demonstrate at least 120 degrees of active shoulder flexion with her left UE.   ? Period Weeks   ? Status Achieved   ?  ? PT LONG TERM GOAL #3  ? Title Patient will be able to demonstrate at least 100 degrees of left shoulder abduction for improved shoulder moiblity.   ? Time 8   ? Period Weeks   ? Status Partially Met   ?  ? PT LONG TERM GOAL #4  ? Title Patient will able  to buckle her seatbelt with her left UE for improved safety.   ? Period Weeks   ? Status Achieved   ? ?  ?  ? ?  ? ? ? ? ? ? ? ? Plan - 01/05/22 1103   ? ? Clinical Impression Statement Pt arrived today doing about the same with LT shldr sore/aching. She was able to continue with shldr strengthening f/b manual STW to LT shldr posterior cuff and  lateral aspect  with notable TPs. Premod and vaso end of Rx.   ? Personal Factors and Comorbidities Comorbidity 1;Comorbidity 2   ? Comorbidities RA, history of cancer,   ? Examination-Activity Limitations Bathing;Carry;Reach Overhead   ? Examination-Participation Restrictions Cleaning   ? Stability/Clinical Decision Making Evolving/Moderate complexity   ? Rehab Potential Good   ? PT Frequency 2x / week   ? PT Duration 8 weeks   ? PT Treatment/Interventions Cryotherapy;Electrical Stimulation;Moist Heat;Neuromuscular re-education;Therapeutic exercise;Therapeutic activities;Patient/family education;Manual techniques;Passive range of motion;Taping;Vasopneumatic Device   ? PT Next Visit Plan Review HEP, progress per protocol, continue with left shoulder PROM.  Pulleys and UE Ranger, wall ladder, ball on wall.   ? Consulted and Agree with Plan of Care Patient   ? ?  ?  ? ?  ? ? ?Patient will benefit from skilled therapeutic intervention in order to improve the following deficits and impairments:  Decreased range of motion, Impaired UE functional use, Decreased activity tolerance, Pain, Decreased strength ? ?Visit Diagnosis: ?Acute pain of left shoulder ? ?Stiffness of left shoulder, not elsewhere classified ? ? ? ? ?Problem List ?Patient Active Problem List  ? Diagnosis Date Noted  ? Port-A-Cath in place 01/14/2021  ? Urethral CA ([place]) 10/10/2020  ? Infection of mandible 05/29/2014  ? 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 ? ?Halyn Flaugher,CHRIS, PTA ?01/05/2022, 11:17 AM ? ?[place] ?[facility] ?[address] ?[place], [place], [address] ?Phone:  [phone]   Fax:  [phone] ? ?Name: ILINE BUCHINGER ?MRN: [id number] ?Date of Birth: 1951/10/29 ? ? ? ?

## 2022-01-07 ENCOUNTER — Encounter: Payer: Medicare Other | Admitting: *Deleted

## 2022-01-12 ENCOUNTER — Ambulatory Visit: Payer: Medicare Other | Admitting: *Deleted

## 2022-01-12 ENCOUNTER — Other Ambulatory Visit: Payer: Self-pay

## 2022-01-12 DIAGNOSIS — M25512 Pain in left shoulder: Secondary | ICD-10-CM

## 2022-01-12 DIAGNOSIS — M25612 Stiffness of left shoulder, not elsewhere classified: Secondary | ICD-10-CM

## 2022-01-12 NOTE — Therapy (Signed)
[facility] ?[facility] ?[address] ?[place], [place], [address] ?Phone: [phone]   Fax:  [phone] ? ?Physical Therapy Treatment ? ?Patient Details  ?Name: Sandra Mann ?MRN: [id number] ?Date of Birth: February 01, 1951 ?Referring Provider (PT): Supple ? ? ?Encounter Date: 01/12/2022 ? ? PT End of Session - 01/12/22 0957   ? ? Visit Number 20   ? Number of Visits 24   ? Date for PT Re-Evaluation 01/08/22   ? PT Start Time [phone]   ? PT Stop Time 6144   ? PT Time Calculation (min) 54 min   ? ?  ?  ? ?  ? ? ?Past Medical History:  ?Diagnosis Date  ? Chest pain   ? GERD (gastroesophageal reflux disease)   ? 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  ? Oral mass   ? left  ? Osteopenia   ? PONV (postoperative nausea and vomiting)   ? Rheumatoid arteritis ([place])   ? Rheumatoid arthritis ([place])   ? Ulcerative proctitis ([place])   ? 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 Wears glasses   ? ? ?Past Surgical History:  ?Procedure Laterality Date  ? COLONOSCOPY    ? CYSTOSCOPY WITH INJECTION N/A 03/04/2021  ? Procedure: CYSTOSCOPY WITH INJECTION OF INDOCYANINE GREEN DYE;  Surgeon: Alexis Frock, MD;  [facility];  Service: Urology;  Laterality: N/A;  ? CYSTOSCOPY WITH URETHRAL DILATATION N/A 09/23/2020  ? Procedure: CYSTOSCOPY WITH URETHRAL DILATATION, EXAM UNDER ANESTHESIA, BIOPSY OF URETHRAL MASS;  Surgeon: Robley Fries, MD;  [facility];  Service: Urology;  Laterality: N/A;  ? EXCISION ORAL TUMOR Left 03/27/2014  ? Procedure: EXCISION OF ORAL CANCER;  Surgeon: Izora Gala, MD;  Location: [facility];  Service: ENT;  Laterality: Left;  ? FINGER GANGLION CYST EXCISION Right 2007  ? small dip  ? INCISION AND DRAINAGE ABSCESS Left 06/12/2014  ? Procedure: LEFT INCISION AND DRAINAGE MANDIBULAR ABSCESS;  Surgeon: Izora Gala, MD;  Location: [facility];  Service: ENT;  Laterality: Left;  ? INCISION AND DRAINAGE ABSCESS Left 11/22/2014  ? Procedure: INCISION AND DRAINAGE LEFT   MANDIBULAR ABSCESS;  Surgeon: Izora Gala, MD;  Location: [facility];  Service: ENT;  Laterality: Left;  ? INCISION AND DRAINAGE OF PERITONSILLAR ABCESS Left 06/01/2014  ? Procedure: INCISION AND DRAINAGE Masticator Space Infection;  Surgeon: Izora Gala, MD;  Location: [facility];  Service: ENT;  Laterality: Left;  ? IR IMAGING GUIDED PORT INSERTION  10/21/2020  ? LYMPH NODE DISSECTION Bilateral 03/04/2021  ? Procedure: LYMPH NODE DISSECTION;  Surgeon: Alexis Frock, MD;  [facility];  Service: Urology;  Laterality: Bilateral;  ? MULTIPLE EXTRACTIONS WITH ALVEOLOPLASTY Left 03/27/2014  ? Procedure: EXTRACTIONS OF NECESSARY TOOTH ;  Surgeon: Ceasar Mons, DDS;  Location: [facility];  Service: Oral Surgery;  Laterality: Left;  ? REVERSE SHOULDER ARTHROPLASTY Left 09/03/2021  ? Procedure: REVERSE SHOULDER ARTHROPLASTY;  Surgeon: Justice Britain, MD;  [facility];  Service: Orthopedics;  Laterality: Left;  ? ROBOT ASSISTED LAPAROSCOPIC COMPLETE CYSTECT ILEAL CONDUIT N/A 03/04/2021  ? Procedure: XI ROBOTIC ASSISTED LAPAROSCOPIC COMPLETE CYSTECT ILEAL CONDUIT;  Surgeon: Alexis Frock, MD;  [facility];  Service: Urology;  Laterality: N/A;  6.5 HRS  ? ROBOTIC ASSISTED LAPAROSCOPIC HYSTERECTOMY AND SALPINGECTOMY Bilateral 03/04/2021  ? Procedure: XI ROBOTIC ASSISTED LAPAROSCOPIC HYSTERECTOMY AND SALPINGECTOMY;  Surgeon: Alexis Frock, MD;  [facility];  Service: Urology;  Laterality: Bilateral;  ? TUBAL LIGATION  ~  1988  ? ? ?There were no vitals filed for this visit. ? ? Subjective Assessment - 01/12/22 0955   ? ? Subjective COVID-19 screen performed prior to patient entering clinic. Reports soreness and stiffness LT shldr. Very sore from STW last Rx   ? Pertinent History RA and history of cancer   ? Limitations Lifting   ? Currently in Pain? Yes   ? Pain Score 3    ? Pain Location Shoulder   ? Pain Orientation Right   ? Pain Descriptors / Indicators Aching;Sore   ? Pain Type Surgical pain   ? Pain  Onset More than a month ago   ? ?  ?  ? ?  ? ? ? ? ? ? ? ? ? ? ? ? ? ? ? ? ? ? ? ? [facility] Adult PT Treatment/Exercise - 01/12/22 0001   ? ?  ? Exercises  ? Exercises Shoulder   ?  ? Shoulder Exercises: Standing  ? Protraction Strengthening;Left;Theraband;10 reps   2x10  ? Theraband Level (Shoulder Protraction) Level 1 (Yellow)   ? External Rotation Strengthening;Left;Theraband   2x10  ? Theraband Level (Shoulder External Rotation) Level 1 (Yellow)   ? Internal Rotation Strengthening;Left;Theraband;10 reps   2x10  ? Theraband Level (Shoulder Internal Rotation) Level 1 (Yellow)   ? Extension Left;10 reps   2x10 (30 degree angle)  ? Theraband Level (Shoulder Extension) Level 1 (Yellow)   ? Row Strengthening;Left;20 reps;Theraband   ? Theraband Level (Shoulder Row) Level 1 (Yellow)   ? Other Standing Exercises UE Ranger on wall x 4 minutes into flexion and circles (CCW and CW)   ?  ? Shoulder Exercises: ROM/Strengthening  ? UBE (Upper Arm Bike) 120 RPM's x 8 minutes.   ?  ? Electrical Stimulation  ? Electrical Stimulation Location L shoulder   ? Electrical Stimulation Action Premod   ? Electrical Stimulation Parameters 80-150hz  x 15 mins   ? Electrical Stimulation Goals Pain   ?  ? Vasopneumatic  ? Number Minutes Vasopneumatic  15 minutes   ? Vasopnuematic Location  Shoulder   ? Vasopneumatic Pressure Low   ? Vasopneumatic Temperature  34   ? ?  ?  ? ?  ? ? ? ? ? ? ? ? ? ? ? ? ? ? ? PT Long Term Goals - 12/25/21 0947   ? ?  ? PT LONG TERM GOAL #1  ? Title Patient will be independent with her HEP.   ? Time 8   ? Period Weeks   ? Status Partially Met   ?  ? PT LONG TERM GOAL #2  ? Title Patient will be able to demonstrate at least 120 degrees of active shoulder flexion with her left UE.   ? Period Weeks   ? Status Achieved   ?  ? PT LONG TERM GOAL #3  ? Title Patient will be able to demonstrate at least 100 degrees of left shoulder abduction for improved shoulder moiblity.   ? Time 8   ? Period Weeks   ? Status Partially  Met   ?  ? PT LONG TERM GOAL #4  ? Title Patient will able to buckle her seatbelt with her left UE for improved safety.   ? Period Weeks   ? Status Achieved   ? ?  ?  ? ?  ? ? ? ? ? ? ? ? Plan - 01/12/22 1010   ? ? Clinical Impression Statement Pt arrived today doing a  little better with LT shldr. Her ROM and function with LT shldr is good, but continues to have pain around deltoid insertion. Rx focused on strengthening and discussed DC after next visit. Finalize HEP and DC.   ? Personal Factors and Comorbidities Comorbidity 1;Comorbidity 2   ? Comorbidities RA, history of cancer,   ? Stability/Clinical Decision Making Evolving/Moderate complexity   ? Rehab Potential Good   ? PT Frequency 2x / week   ? PT Duration 8 weeks   ? PT Treatment/Interventions Cryotherapy;Electrical Stimulation;Moist Heat;Neuromuscular re-education;Therapeutic exercise;Therapeutic activities;Patient/family education;Manual techniques;Passive range of motion;Taping;Vasopneumatic Device   ? PT Next Visit Plan Get handouts for HEP and DC to HEP   ? Consulted and Agree with Plan of Care Patient   ? ?  ?  ? ?  ? ? ?Patient will benefit from skilled therapeutic intervention in order to improve the following deficits and impairments:  Decreased range of motion, Impaired UE functional use, Decreased activity tolerance, Pain, Decreased strength ? ?Visit Diagnosis: ?Acute pain of left shoulder ? ?Stiffness of left shoulder, not elsewhere classified ? ? ? ? ?Problem List ?Patient Active Problem List  ? Diagnosis Date Noted  ? Port-A-Cath in place 01/14/2021  ? Urethral CA ([place]) 10/10/2020  ? Infection of mandible 05/29/2014  ? 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 ? ?Kaimana Neuzil,CHRIS, PTA ?01/12/2022, 10:42 AM ? ?[facility] ?[facility] ?[place] ?[place], [place], [address] ?Phone: [phone]   Fax:  [phone] ? ?Name: SHANELL ADEN ?MRN: [id number] ?Date of Birth: 11/18/1950 ? ? ? ?

## 2022-01-14 ENCOUNTER — Other Ambulatory Visit: Payer: Self-pay

## 2022-01-14 ENCOUNTER — Ambulatory Visit: Payer: Medicare Other | Admitting: *Deleted

## 2022-01-14 DIAGNOSIS — M25512 Pain in left shoulder: Secondary | ICD-10-CM | POA: Diagnosis not present

## 2022-01-14 NOTE — Therapy (Signed)
[facility] ?[facility] ?[address] ?[place], [place], [address] ?Phone: [phone]   Fax:  [phone] ? ?Physical Therapy Treatment ? ?Patient Details  ?Name: Sandra Mann ?MRN: [id number] ?Date of Birth: August 27, 1951 ?Referring Provider (PT): Supple ? ? ?Encounter Date: 01/14/2022 ? ? PT End of Session - 01/14/22 0954   ? ? Visit Number 21   ? Number of Visits 24   ? Date for PT Re-Evaluation 01/14/22   ? Authorization Type FOTO DC done   ? PT Start Time [id number]   ? PT Stop Time 3664   ? PT Time Calculation (min) 53 min   ? Activity Tolerance Patient tolerated treatment well   ? ?  ?  ? ?  ? ? ?Past Medical History:  ?Diagnosis Date  ? Chest pain   ? GERD (gastroesophageal reflux disease)   ? 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  ? Oral mass   ? left  ? Osteopenia   ? PONV (postoperative nausea and vomiting)   ? Rheumatoid arteritis ([place])   ? Rheumatoid arthritis ([place])   ? Ulcerative proctitis ([place])   ? 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 Wears glasses   ? ? ?Past Surgical History:  ?Procedure Laterality Date  ? COLONOSCOPY    ? CYSTOSCOPY WITH INJECTION N/A 03/04/2021  ? Procedure: CYSTOSCOPY WITH INJECTION OF INDOCYANINE GREEN DYE;  Surgeon: Alexis Frock, MD;  [facility];  Service: Urology;  Laterality: N/A;  ? CYSTOSCOPY WITH URETHRAL DILATATION N/A 09/23/2020  ? Procedure: CYSTOSCOPY WITH URETHRAL DILATATION, EXAM UNDER ANESTHESIA, BIOPSY OF URETHRAL MASS;  Surgeon: Robley Fries, MD;  [facility];  Service: Urology;  Laterality: N/A;  ? EXCISION ORAL TUMOR Left 03/27/2014  ? Procedure: EXCISION OF ORAL CANCER;  Surgeon: Izora Gala, MD;  Location: [facility];  Service: ENT;  Laterality: Left;  ? FINGER GANGLION CYST EXCISION Right 2007  ? small dip  ? INCISION AND DRAINAGE ABSCESS Left 06/12/2014  ? Procedure: LEFT INCISION AND DRAINAGE MANDIBULAR ABSCESS;  Surgeon: Izora Gala, MD;  Location: [facility];  Service: ENT;  Laterality:  Left;  ? INCISION AND DRAINAGE ABSCESS Left 11/22/2014  ? Procedure: INCISION AND DRAINAGE LEFT  MANDIBULAR ABSCESS;  Surgeon: Izora Gala, MD;  Location: [facility];  Service: ENT;  Laterality: Left;  ? INCISION AND DRAINAGE OF PERITONSILLAR ABCESS Left 06/01/2014  ? Procedure: INCISION AND DRAINAGE Masticator Space Infection;  Surgeon: Izora Gala, MD;  Location: [facility];  Service: ENT;  Laterality: Left;  ? IR IMAGING GUIDED PORT INSERTION  10/21/2020  ? LYMPH NODE DISSECTION Bilateral 03/04/2021  ? Procedure: LYMPH NODE DISSECTION;  Surgeon: Alexis Frock, MD;  [facility];  Service: Urology;  Laterality: Bilateral;  ? MULTIPLE EXTRACTIONS WITH ALVEOLOPLASTY Left 03/27/2014  ? Procedure: EXTRACTIONS OF NECESSARY TOOTH ;  Surgeon: Ceasar Mons, DDS;  Location: [facility];  Service: Oral Surgery;  Laterality: Left;  ? REVERSE SHOULDER ARTHROPLASTY Left 09/03/2021  ? Procedure: REVERSE SHOULDER ARTHROPLASTY;  Surgeon: Justice Britain, MD;  [facility];  Service: Orthopedics;  Laterality: Left;  ? ROBOT ASSISTED LAPAROSCOPIC COMPLETE CYSTECT ILEAL CONDUIT N/A 03/04/2021  ? Procedure: XI ROBOTIC ASSISTED LAPAROSCOPIC COMPLETE CYSTECT ILEAL CONDUIT;  Surgeon: Alexis Frock, MD;  [facility];  Service: Urology;  Laterality: N/A;  6.5 HRS  ? ROBOTIC ASSISTED LAPAROSCOPIC HYSTERECTOMY AND SALPINGECTOMY Bilateral 03/04/2021  ? Procedure: XI ROBOTIC ASSISTED LAPAROSCOPIC HYSTERECTOMY AND SALPINGECTOMY;  Surgeon: Alexis Frock,  MD;  [facility];  Service: Urology;  Laterality: Bilateral;  ? TUBAL LIGATION  ~ 1988  ? ? ?There were no vitals filed for this visit. ? ? Subjective Assessment - 01/14/22 0951   ? ? Subjective COVID-19 screen performed prior to patient entering clinic. Reports soreness and stiffness LT shldr. DC today   ? Pertinent History RA and history of cancer   ? Limitations Lifting   ? Patient Stated Goals reach overhead, wash under her other arm, buckle her seatbelt   ? Currently in  Pain? Yes   ? Pain Score 4    ? Pain Location Shoulder   ? Pain Orientation Right   ? Pain Descriptors / Indicators Aching;Sore   ? ?  ?  ? ?  ? ? ? ? ? ? ? ? ? ? ? ? ? ? ? ? ? ? ? ? [facility] Adult PT Treatment/Exercise - 01/14/22 0001   ? ?  ? Exercises  ? Exercises Shoulder   ?  ? Shoulder Exercises: Standing  ? Protraction Strengthening;Left   2x15  ? Theraband Level (Shoulder Protraction) Level 1 (Yellow)   ? External Rotation Strengthening;Left;Theraband   2x15 smal motions  ? Theraband Level (Shoulder External Rotation) Level 1 (Yellow)   ? Internal Rotation Strengthening;Left;Theraband;10 reps;15 reps   2x15  ? Theraband Level (Shoulder Internal Rotation) Level 1 (Yellow)   ? Extension Left;10 reps   2x15 (30 degree angle) focus on posterior delt  ? Theraband Level (Shoulder Extension) Level 1 (Yellow)   ? Row Strengthening;Left;Theraband;15 reps   2x15  ? Theraband Level (Shoulder Row) Level 1 (Yellow)   ? Other Standing Exercises Bicep curl  2# 2x20   ?  ? Shoulder Exercises: ROM/Strengthening  ? UBE (Upper Arm Bike) 120 RPM's x 8 minutes.   ?  ? Modalities  ? Modalities Electrical Stimulation;Vasopneumatic   ?  ? Electrical Stimulation  ? Electrical Stimulation Location L shoulder   ? Electrical Stimulation Action Premod   ? Electrical Stimulation Parameters 80-_0  x 15 mins   ? Electrical Stimulation Goals Pain   ?  ? Vasopneumatic  ? Number Minutes Vasopneumatic  15 minutes   ? Vasopnuematic Location  Shoulder   ? Vasopneumatic Pressure Low   ? Vasopneumatic Temperature  34   ? ?  ?  ? ?  ? ? ? ? ? ? ? ? ? ? ? ? ? ? ? PT Long Term Goals - 01/14/22 0958   ? ?  ? PT LONG TERM GOAL #1  ? Title Patient will be independent with her HEP.   ? Time 8   ? Status Achieved   ?  ? PT LONG TERM GOAL #2  ? Title Patient will be able to demonstrate at least 120 degrees of active shoulder flexion with her left UE.   ? Time 8   ? Period Weeks   ? Status Achieved   ? Target Date 11/23/21   ?  ? PT LONG TERM GOAL #3  ? Title  Patient will be able to demonstrate at least 100 degrees of left shoulder abduction for improved shoulder moiblity.   ? Time 8   ? Period Weeks   ? Status Achieved   ?  ? PT LONG TERM GOAL #4  ? Title Patient will able to buckle her seatbelt with her left UE for improved safety.   ? Baseline unable   ? Period Weeks   ? Status Achieved   ? ?  ?  ? ?  ? ? ? ? ? ? ? ?  Plan - 01/14/22 1607   ? ? Clinical Impression Statement Pt arrived today doing fairly well with LT shldr, but still with pain at times. Rx focused on HEP for strengthening LT shldr. Handout given. All LTGs met. DC to HEP   ? Personal Factors and Comorbidities Comorbidity 1;Comorbidity 2   ? Comorbidities RA, history of cancer,   ? Examination-Activity Limitations Bathing;Carry;Reach Overhead   ? Stability/Clinical Decision Making Evolving/Moderate complexity   ? Rehab Potential Good   ? PT Treatment/Interventions Cryotherapy;Electrical Stimulation;Moist Heat;Neuromuscular re-education;Therapeutic exercise;Therapeutic activities;Patient/family education;Manual techniques;Passive range of motion;Taping;Vasopneumatic Device   ? PT Next Visit Plan DC to HEP   ? ?  ?  ? ?  ? ? ?Patient will benefit from skilled therapeutic intervention in order to improve the following deficits and impairments:  Decreased range of motion, Impaired UE functional use, Decreased activity tolerance, Pain, Decreased strength ? ?Visit Diagnosis: ?Acute pain of left shoulder - Plan: PT plan of care cert/re-cert ? ?Stiffness of left shoulder, not elsewhere classified - Plan: PT plan of care cert/re-cert ? ? ? ? ?Problem List ?Patient Active Problem List  ? Diagnosis Date Noted  ? Port-A-Cath in place 01/14/2021  ? 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 Infection of mandible 05/29/2014  ? 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 ?Chris Davin Archuletta PTA ? ?PHYSICAL THERAPY DISCHARGE SUMMARY ? ?Visits from Start of Care: 21 ? ?Current functional level related to goals / functional outcomes: ?See  above. ?  ?Remaining deficits: ?All goals met. ?  ?Education / Equipment: ?HEP.  ? ?Patient agrees to discharge. Patient goals were met. Patient is being discharged due to meeting the stated rehab goals. ?

## 2022-01-21 ENCOUNTER — Telehealth: Payer: Self-pay | Admitting: Oncology

## 2022-01-21 NOTE — Telephone Encounter (Signed)
Scheduled per 03/02 los, patient has been called and notified. ?

## 2022-01-26 ENCOUNTER — Other Ambulatory Visit: Payer: Self-pay

## 2022-01-26 ENCOUNTER — Encounter (HOSPITAL_COMMUNITY): Payer: Self-pay

## 2022-01-26 ENCOUNTER — Ambulatory Visit (HOSPITAL_COMMUNITY)
Admission: RE | Admit: 2022-01-26 | Discharge: 2022-01-26 | Disposition: A | Discharge status: Home / Self Care | Payer: Medicare Other | Source: Ambulatory Visit | Attending: Oncology | Admitting: Oncology

## 2022-01-26 DIAGNOSIS — C680 Malignant neoplasm of urethra: Secondary | ICD-10-CM

## 2022-01-26 MED ORDER — IOHEXOL 300 MG/ML  SOLN
100.0000 mL | Freq: Once | INTRAMUSCULAR | Status: AC | PRN
  Administered 2022-01-26: 100 mL via INTRAVENOUS

## 2022-01-26 MED ORDER — SODIUM CHLORIDE (PF) 0.9 % IJ SOLN
INTRAMUSCULAR | Status: AC
  Filled 2022-01-26: qty 50

## 2022-01-29 ENCOUNTER — Inpatient Hospital Stay (HOSPITAL_BASED_OUTPATIENT_CLINIC_OR_DEPARTMENT_OTHER): Payer: Medicare Other | Admitting: Oncology

## 2022-01-29 ENCOUNTER — Inpatient Hospital Stay: Payer: Medicare Other

## 2022-01-29 ENCOUNTER — Other Ambulatory Visit: Payer: Self-pay

## 2022-01-29 VITALS — BP 130/63 | HR 62 | Temp 97.6°F | Resp 17 | Ht 63.5 in | Wt 119.2 lb

## 2022-01-29 DIAGNOSIS — C680 Malignant neoplasm of urethra: Secondary | ICD-10-CM

## 2022-01-29 DIAGNOSIS — Z5112 Encounter for antineoplastic immunotherapy: Secondary | ICD-10-CM | POA: Diagnosis not present

## 2022-01-29 DIAGNOSIS — Z95828 Presence of other vascular implants and grafts: Secondary | ICD-10-CM

## 2022-01-29 LAB — CBC WITH DIFFERENTIAL (CANCER CENTER ONLY)
Abs Immature Granulocytes: 0.02 10*3/uL (ref 0.00–0.07)
Basophils Absolute: 0 10*3/uL (ref 0.0–0.1)
Basophils Relative: 1 %
Eosinophils Absolute: 0.1 10*3/uL (ref 0.0–0.5)
Eosinophils Relative: 1 %
HCT: 33 % — ABNORMAL LOW (ref 36.0–46.0)
Hemoglobin: 11.9 g/dL — ABNORMAL LOW (ref 12.0–15.0)
Immature Granulocytes: 0 %
Lymphocytes Relative: 25 %
Lymphs Abs: 1.6 10*3/uL (ref 0.7–4.0)
MCH: 33.7 pg (ref 26.0–34.0)
MCHC: 36.1 g/dL — ABNORMAL HIGH (ref 30.0–36.0)
MCV: 93.5 fL (ref 80.0–100.0)
Monocytes Absolute: 0.6 10*3/uL (ref 0.1–1.0)
Monocytes Relative: 10 %
Neutro Abs: 4.2 10*3/uL (ref 1.7–7.7)
Neutrophils Relative %: 63 %
Platelet Count: 215 10*3/uL (ref 150–400)
RBC: 3.53 MIL/uL — ABNORMAL LOW (ref 3.87–5.11)
RDW: 14 % (ref 11.5–15.5)
WBC Count: 6.6 10*3/uL (ref 4.0–10.5)
nRBC: 0 % (ref 0.0–0.2)

## 2022-01-29 LAB — TSH: TSH: 1.187 u[IU]/mL (ref 0.308–3.960)

## 2022-01-29 LAB — CMP (CANCER CENTER ONLY)
ALT: 15 U/L (ref 0–44)
AST: 29 U/L (ref 15–41)
Albumin: 4.2 g/dL (ref 3.5–5.0)
Alkaline Phosphatase: 69 U/L (ref 38–126)
Anion gap: 8 (ref 5–15)
BUN: 15 mg/dL (ref 8–23)
CO2: 25 mmol/L (ref 22–32)
Calcium: 9.1 mg/dL (ref 8.9–10.3)
Chloride: 104 mmol/L (ref 98–111)
Creatinine: 0.65 mg/dL (ref 0.44–1.00)
GFR, Estimated: >60 mL/min (ref >60)
Glucose, Bld: 91 mg/dL (ref 70–99)
Potassium: 3.8 mmol/L (ref 3.5–5.1)
Sodium: 137 mmol/L (ref 135–145)
Total Bilirubin: 1.6 mg/dL — ABNORMAL HIGH (ref 0.3–1.2)
Total Protein: 7 g/dL (ref 6.5–8.1)

## 2022-01-29 MED ORDER — SODIUM CHLORIDE 0.9% FLUSH
10.0000 mL | Freq: Once | INTRAVENOUS | Status: AC
  Administered 2022-01-29: 10 mL

## 2022-01-29 MED ORDER — HEPARIN SOD (PORK) LOCK FLUSH 100 UNIT/ML IV SOLN
500.0000 [IU] | Freq: Once | INTRAVENOUS | Status: AC | PRN
  Administered 2022-01-29: 500 [IU]

## 2022-01-29 MED ORDER — SODIUM CHLORIDE 0.9 % IV SOLN
480.0000 mg | Freq: Once | INTRAVENOUS | Status: AC
  Administered 2022-01-29: 480 mg via INTRAVENOUS
  Filled 2022-01-29: qty 48

## 2022-01-29 MED ORDER — SODIUM CHLORIDE 0.9% FLUSH
10.0000 mL | INTRAVENOUS | Status: DC | PRN
  Administered 2022-01-29: 10 mL

## 2022-01-29 MED ORDER — SODIUM CHLORIDE 0.9 % IV SOLN: Freq: Once | INTRAVENOUS | Status: AC

## 2022-01-29 NOTE — Progress Notes (Signed)
Per Alen Blew MD, ok to treat with Bilirubin 1.6. ?

## 2022-01-29 NOTE — Patient Instructions (Signed)
[facility]  Discharge Instructions: ?Thank you for choosing [facility] to provide your oncology and hematology care.  ? ?If you have a lab appointment with the [facility], please go directly to the [facility] and check in at the registration area. ?  ?Wear comfortable clothing and clothing appropriate for easy access to any Portacath or PICC line.  ? ?We strive to give you quality time with your provider. You may need to reschedule your appointment if you arrive late (15 or more minutes).  Arriving late affects you and other patients whose appointments are after yours.  Also, if you miss three or more appointments without notifying the office, you may be dismissed from the clinic at the provider?s discretion.    ?  ?For prescription refill requests, have your pharmacy contact our office and allow 72 hours for refills to be completed.   ? ?Today you received the following chemotherapy and/or immunotherapy agents: Nivolumab.    ?  ?To help prevent nausea and vomiting after your treatment, we encourage you to take your nausea medication as directed. ? ?BELOW ARE SYMPTOMS THAT SHOULD BE REPORTED IMMEDIATELY: ?*FEVER GREATER THAN 100.4 F (38 ?C) OR HIGHER ?*CHILLS OR SWEATING ?*NAUSEA AND VOMITING THAT IS NOT CONTROLLED WITH YOUR NAUSEA MEDICATION ?*UNUSUAL SHORTNESS OF BREATH ?*UNUSUAL BRUISING OR BLEEDING ?*URINARY PROBLEMS (pain or burning when urinating, or frequent urination) ?*BOWEL PROBLEMS (unusual diarrhea, constipation, pain near the anus) ?TENDERNESS IN MOUTH AND THROAT WITH OR WITHOUT PRESENCE OF ULCERS (sore throat, sores in mouth, or a toothache) ?UNUSUAL RASH, SWELLING OR PAIN  ?UNUSUAL VAGINAL DISCHARGE OR ITCHING  ? ?Items with * indicate a potential emergency and should be followed up as soon as possible or go to the Emergency Department if any problems should occur. ? ?Please show the CHEMOTHERAPY ALERT CARD or IMMUNOTHERAPY ALERT CARD at check-in to  the Emergency Department and triage nurse. ? ?Should you have questions after your visit or need to cancel or reschedule your appointment, please contact [facility]  Dept: [phone]  and follow the prompts.  Office hours are 8:00 a.m. to 4:30 p.m. Monday - Friday. Please note that voicemails left after 4:00 p.m. may not be returned until the following business day.  We are closed weekends and major holidays. You have access to a nurse at all times for urgent questions. Please call the main number to the clinic Dept: [phone] and follow the prompts. ? ? ?For any non-urgent questions, you may also contact your provider using MyChart. We now offer e-Visits for anyone 58 and older to request care online for non-urgent symptoms. For details visit [email or web address]. ?  ?Also download the MyChart app! Go to the app store, search "MyChart", open the app, select [facility], and log in with your MyChart username and password. ? ?Due to Covid, a mask is required upon entering the hospital/clinic. If you do not have a mask, one will be given to you upon arrival. For doctor visits, patients may have 1 support person aged 40 or older with them. For treatment visits, patients cannot have anyone with them due to current Covid guidelines and our immunocompromised population.  ? ?

## 2022-01-29 NOTE — Progress Notes (Signed)
Hematology and Oncology Follow Up Visit ? ?Sandra Mann ?[id number] ?Jan 20, 1951 71 y.o. ?01/29/2022 11:39 AM ?[name], MDEksir, [name], MD  ? 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 ? ?Prior Therapy: ?She is status post cystoscopy and biopsy in November 2021 which showed high-grade urothelial carcinoma. ?Neoadjuvant chemotherapy utilizing gemcitabine and cisplatin started on October 29, 2020.  She completed 4 cycles of therapy on January 07, 2021. ?She is status post robotic assisted laparoscopic cystectomy, hysterectomy and lymph node dissection completed on Mar 04, 2021 completed by Dr. Tammi Klippel.  He final pathology showed T4N1 disease.  She had 1 out of 16 lymph node involvement. ? ?Current therapy: Nivolumab 480 mg every 4 weeks started in July 2022.  She is here for cycle 9 of therapy.  ? ? ?Interim History: Sandra Mann is here for a follow-up evaluation.  Since last visit, she reports feeling well without any major complaints.  She did have a minor rheumatoid arthritis flareup in her right wrist and resolved rather quickly with the use of Tylenol.  She did not have any diffuse arthralgias or myalgias.  She did not require any additional treatment.  She has no other complaints to nivolumab.  She denied any GI toxicity or dermatological complications. ? ?Medications: Reviewed without changes. ?Current Outpatient Medications  ?Medication Sig Dispense Refill  ? acetaminophen (TYLENOL) 500 MG tablet Take 500 mg by mouth every 6 (six) hours as needed for moderate pain.    ? cycloSPORINE (RESTASIS) 0.05 % ophthalmic emulsion Place 1 drop into both eyes 2 (two) times daily.    ? folic acid (FOLVITE) 283 MCG tablet Take 1,600 mcg by mouth daily.     ? lidocaine-prilocaine (EMLA) cream Apply 1 application topically as needed. (Patient taking differently: Apply 1 application topically as needed (for the port).) 30 g 0  ? Melatonin 10 MG  TABS Take 10 mg by mouth at bedtime.    ? Melatonin 10 MG TBCR Take 10 mg by mouth See admin instructions.    ? mesalamine (CANASA) 1000 MG suppository Place 1,000 mg rectally every other day. At bedtime    ? methotrexate (RHEUMATREX) 2.5 MG tablet Take 20 mg by mouth every Tuesday.    ? Multiple Vitamin (MULTIVITAMIN WITH MINERALS) TABS tablet Take 1 tablet by mouth at bedtime. Silver    ? valACYclovir (VALTREX) 500 MG tablet Take 1 tablet (500 mg total) by mouth daily. 14 tablet 1  ? ?No current facility-administered medications for this visit.  ? ?Facility-Administered Medications Ordered in Other Visits  ?Medication Dose Route Frequency Provider Last Rate Last Admin  ? sodium chloride flush (NS) 0.9 % injection 10 mL  10 mL Intracatheter Once Wyatt Portela, MD      ? ? ? ?Allergies:  ?Allergies  ?Allergen Reactions  ? Codeine Nausea And Vomiting  ? Sulfa Antibiotics Nausea And Vomiting  ? Vicodin [Hydrocodone-Acetaminophen] Nausea And Vomiting  ? ? ? ? ?Physical Exam: ? ? ? ?Blood pressure 130/63, pulse 62, temperature 97.6 ?F (36.4 ?C), temperature source Temporal, resp. rate 17, height 5' 3.5" (1.613 m), weight 119 lb 3.2 oz (54.1 kg), SpO2 100 %. ? ? ? ? ? ? ? ? ? ?ECOG: 0 ? ? ?General appearance: Alert, awake without any distress. ?Head: Atraumatic without abnormalities ?Oropharynx: Without any thrush or ulcers. ?Eyes: No scleral icterus. ?Lymph nodes: No lymphadenopathy noted in the cervical, supraclavicular, or axillary nodes ?Heart:regular rate and rhythm, without any  murmurs or gallops.   ?Lung: Clear to auscultation without any rhonchi, wheezes or dullness to percussion. ?Abdomin: Soft, nontender without any shifting dullness or ascites. ?Musculoskeletal: No clubbing or cyanosis. ?Neurological: No motor or sensory deficits. ?Skin: No rashes or lesions. ? ? ? ? ? ? ? ? ? ? ? ? ? ?Lab Results: ?Lab Results  ?Component Value Date  ? WBC 5.7 12/31/2021  ? HGB 11.5 (L) 12/31/2021  ? HCT 33.9 (L) 12/31/2021   ? MCV 95.8 12/31/2021  ? PLT 234 12/31/2021  ? ?  Chemistry   ?   ?Component Value Date/Time  ? NA 137 12/31/2021 1207  ? K 3.7 12/31/2021 1207  ? CL 104 12/31/2021 1207  ? CO2 27 12/31/2021 1207  ? BUN 11 12/31/2021 1207  ? CREATININE 0.71 12/31/2021 1207  ?    ?Component Value Date/Time  ? CALCIUM 9.3 12/31/2021 1207  ? ALKPHOS 72 12/31/2021 1207  ? AST 25 12/31/2021 1207  ? ALT 13 12/31/2021 1207  ? BILITOT 1.0 12/31/2021 1207  ?  ? ? ?IMPRESSION: ?1. Stable exam. No new or progressive findings. No evidence for ?metastatic disease in the chest, abdomen, or pelvis. ?2. Status post cystectomy with ileal conduit. ?3. Aortic Atherosclerosis (ICD10-I70.0). ? ?Impression and Plan: ? ?71 year old woman with: ?  ?1.  Urethral cancer diagnosed in 2021.  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  ? ?She continues to receive adjuvant immunotherapy given her high risk disease.  Risks and benefits of continuing nivolumab to complete a year were discussed.  Complications including immune mediated concerns, GI toxicity among others were reviewed.  CT scan obtained on January 26, 2022 showed no evidence of metastatic disease.  I recommended continuing this current treatment for the time being.  We will repeat imaging studies in 6 months.  She is agreeable to proceed. ? ? ?2.  IV access: Port-A-Cath currently in use without any problems. ?  ?3.  Antiemetics: Compazine is available to her without any nausea or vomiting. ?  ?4.  Immune mediated complications: She has not experienced any complication clinic pneumonitis, colitis and thyroid disease.  She has reported some minor rheumatoid arthritis flare which we will continue to monitor.  We can consider therapy discontinuation if she has more severe episodes. ? ?5.  Goals of care: Therapy is curative at this time.  Aggressive measures are warranted given her incurable cancer. ? ? ? 6.  Follow-up: In 4 weeks for repeat evaluation. ?  ?30  minutes were dedicated to  this encounter.  The time was spent on reviewing laboratory data, disease status update, reviewing imaging studies and future plan of care discussion. ? ?[name], MD ?3/31/202311:39 AM ? ?

## 2022-02-08 IMAGING — CT CT CHEST-ABD-PELV W/ CM
2 of 5 series · 13 of 36 positions shown, 15 images · IV contrast (OMNIPAQUE)
Comparison: 07/10/2021

CLINICAL DATA: Ureteral cancer.  Restaging.

EXAM:
CT CHEST, ABDOMEN, AND PELVIS WITH CONTRAST
TECHNIQUE: Multidetector CT imaging of the chest, abdomen and pelvis was
performed following the standard protocol during bolus
administration of intravenous contrast.
CONTRAST:  75mL OMNIPAQUE IOHEXOL 350 MG/ML SOLN

[Series 2: cap with · axial · 0.74mm/px · z∈[+1045,+1580]mm · 10 of 131 slices shown, 12 images]
[im 12/131  mediastinal]
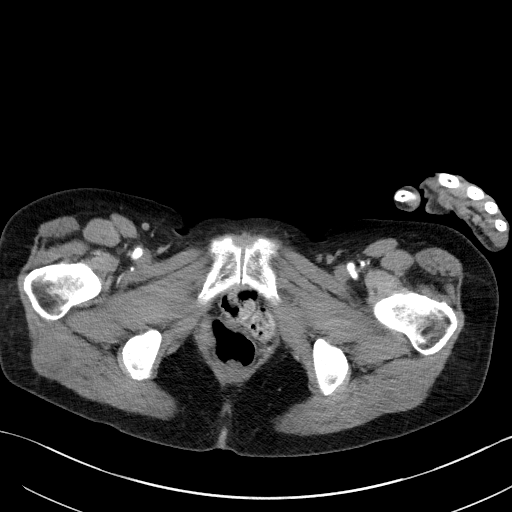
[im 12/131  bone]
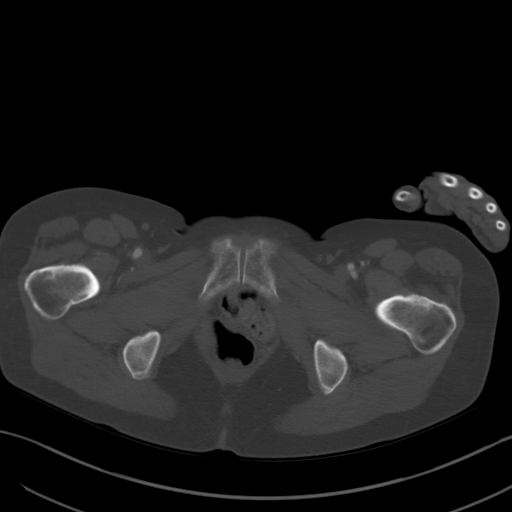
[im 24/131  mediastinal]
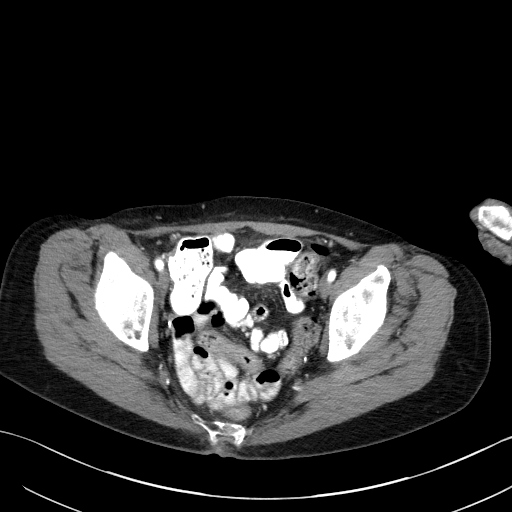
[im 36/131  mediastinal]
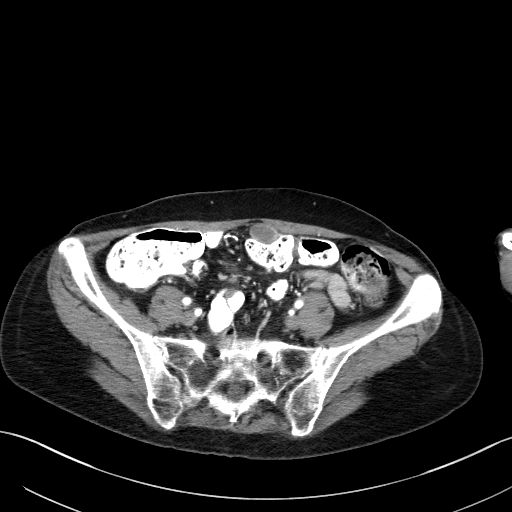
[im 48/131  mediastinal]
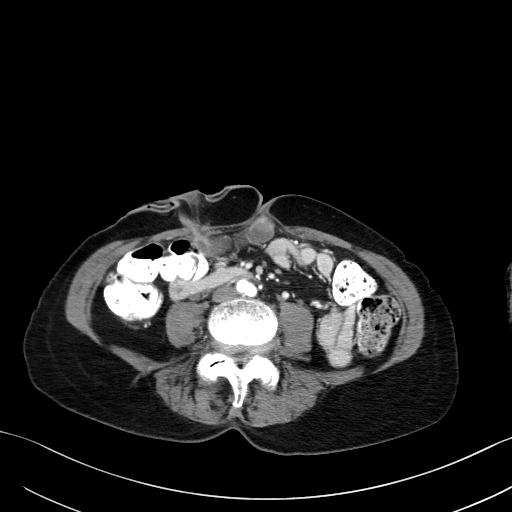
[im 60/131  mediastinal]
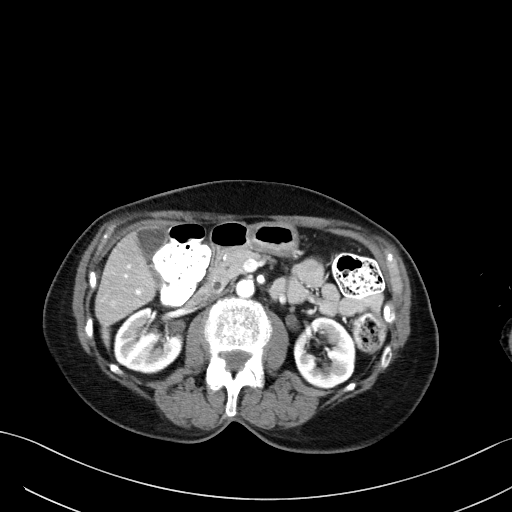
[im 71/131  mediastinal]
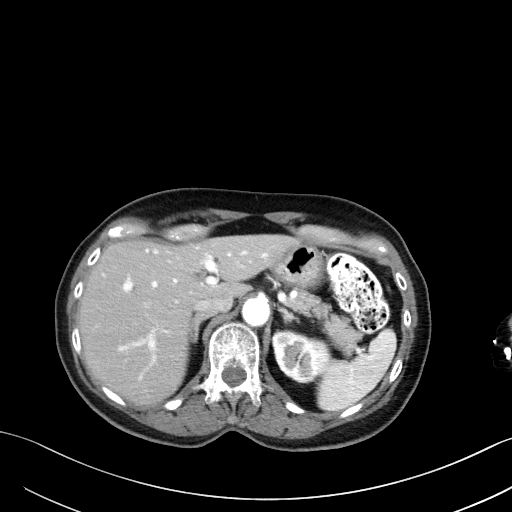
[im 83/131  mediastinal]
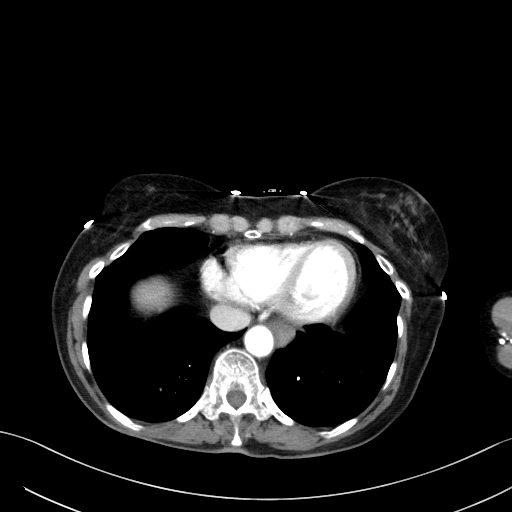
[im 95/131  mediastinal]
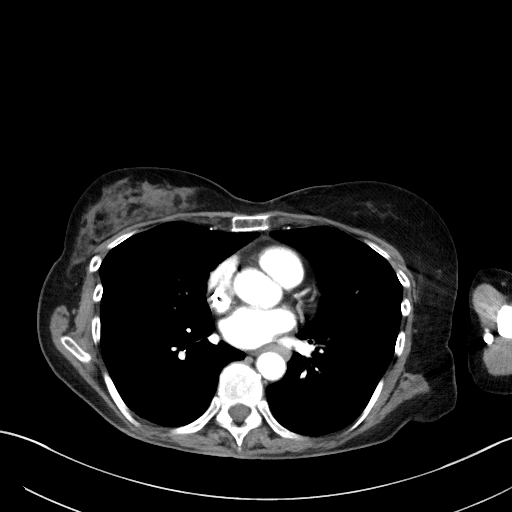
[im 107/131  mediastinal]
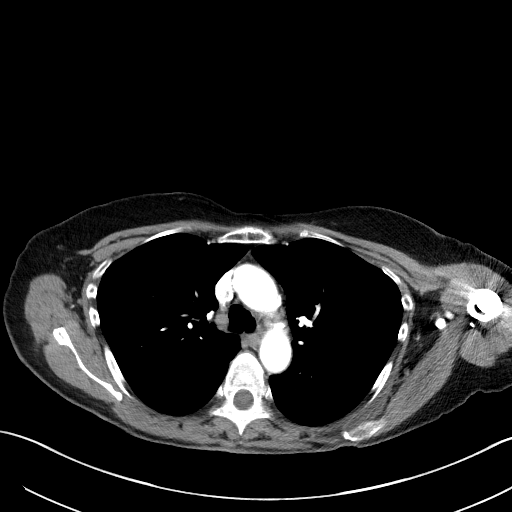
[im 107/131  bone]
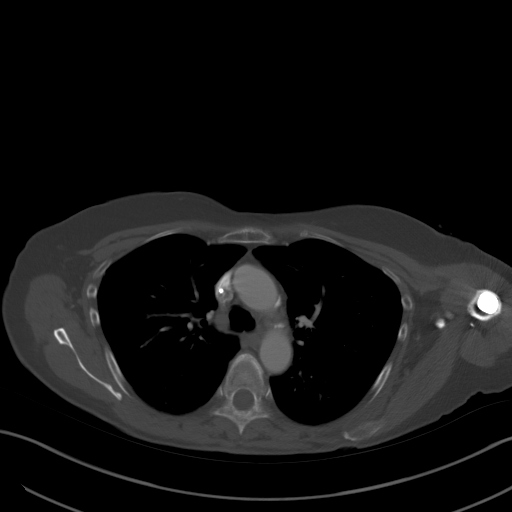
[im 119/131  mediastinal]
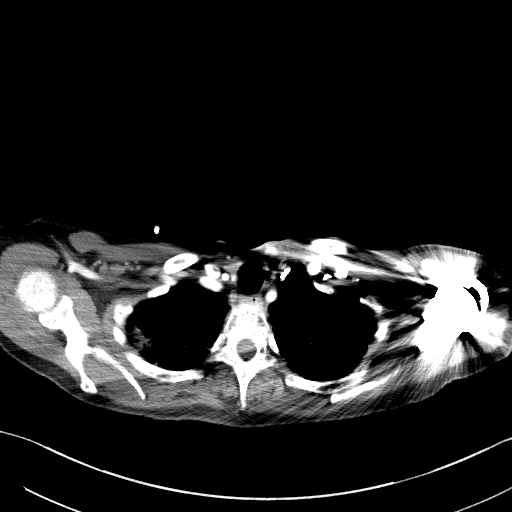

[Series 5: coronals · coronal · 0.87mm/px · 3 of 97 slices shown]
[im 20/97  mediastinal]
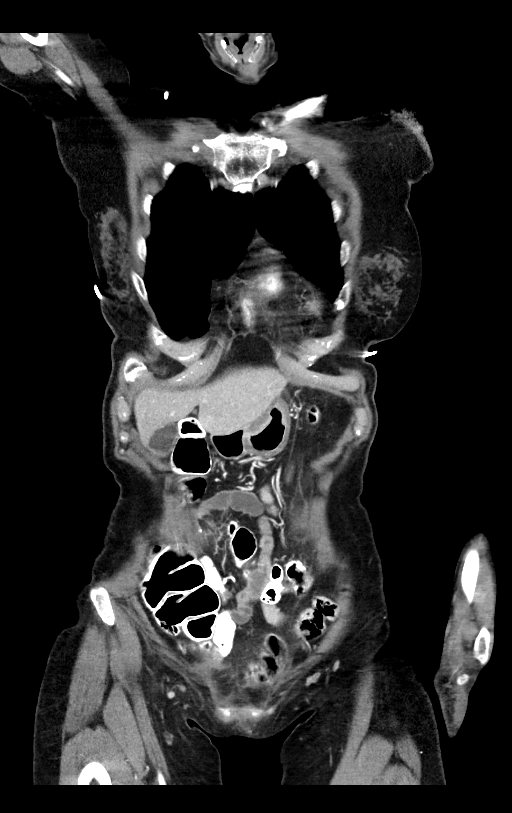
[im 39/97  mediastinal]
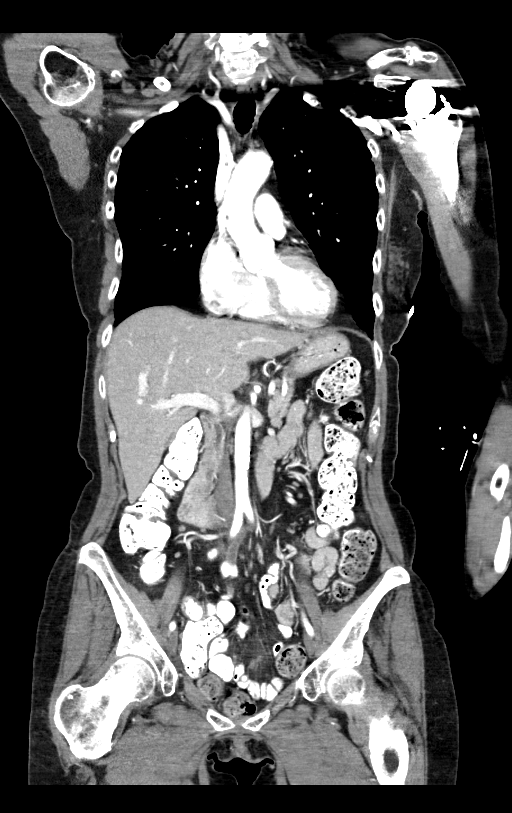
[im 58/97  mediastinal]
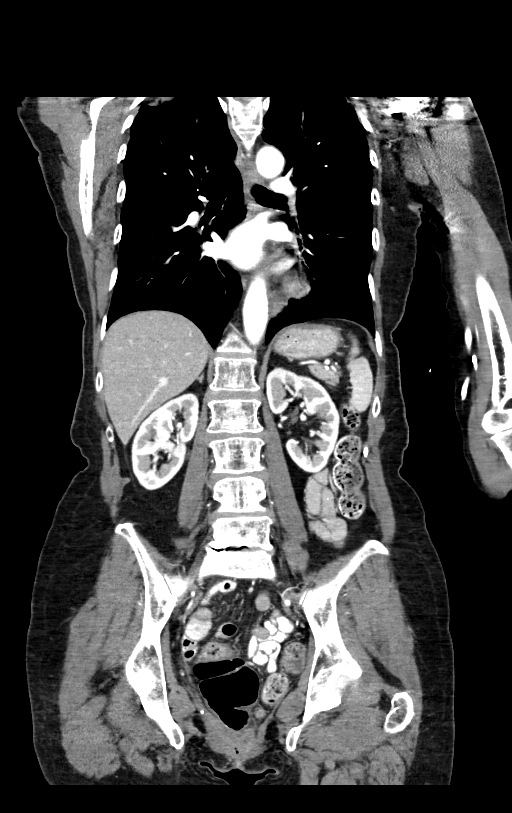

[13 of 36 positions shown; findings below may reference images not displayed]

FINDINGS: CT CHEST FINDINGS

Cardiovascular: The heart size is normal. No substantial pericardial
effusion. Coronary artery calcification is evident. Mild
atherosclerotic calcification is noted in the wall of the thoracic
aorta. Right Port-A-Cath tip is positioned in the right atrium.

Mediastinum/Nodes: No mediastinal lymphadenopathy. There is no hilar
lymphadenopathy. The esophagus has normal imaging features. There is
no axillary lymphadenopathy.

Lungs/Pleura: Stable biapical pleuroparenchymal scarring. No
suspicious pulmonary nodule or mass. No focal airspace
consolidation. No pleural effusion.

Musculoskeletal: Left shoulder replacement. No worrisome lytic or
sclerotic osseous abnormality.

CT ABDOMEN PELVIS FINDINGS

Hepatobiliary: No suspicious focal abnormality within the liver
parenchyma. There is no evidence for gallstones, gallbladder wall
thickening, or pericholecystic fluid. No intrahepatic or
extrahepatic biliary dilation.

Pancreas: No focal mass lesion. No dilatation of the main duct. No
intraparenchymal cyst. No peripancreatic edema.

Spleen: No splenomegaly. No focal mass lesion.

Adrenals/Urinary Tract: Kidneys unremarkable. Status post cystectomy
with right lower quadrant ileal diversion. No substantial
hydroureter. Ileal conduit is nondilated.

Stomach/Bowel: Stomach is unremarkable. No gastric wall thickening.
No evidence of outlet obstruction. Duodenum is normally positioned
as is the ligament of Treitz. No small bowel wall thickening. No
small bowel dilatation. The terminal ileum is normal. The appendix
is not well visualized, but there is no edema or inflammation in the
region of the cecum. No gross colonic mass. No colonic wall
thickening.

Vascular/Lymphatic: There is mild atherosclerotic calcification of
the abdominal aorta without aneurysm. There is no gastrohepatic or
hepatoduodenal ligament lymphadenopathy. No retroperitoneal or
mesenteric lymphadenopathy. No pelvic sidewall lymphadenopathy. 8 mm
short axis right external iliac node on 105/2 is stable in the
interval.

Reproductive: Unremarkable.

Other: No intraperitoneal free fluid.

Musculoskeletal: No worrisome lytic or sclerotic osseous
abnormality.
IMPRESSION: 1. Stable exam. No new or progressive findings to suggest recurrent
or metastatic disease in the chest, abdomen, or pelvis.
2. Status post cystectomy with right lower quadrant ileal diversion.
3. Stable 8 mm short axis right external iliac node.
4. Aortic Atherosclerosis ([id number]).

## 2022-02-25 ENCOUNTER — Inpatient Hospital Stay: Payer: Medicare Other

## 2022-02-25 ENCOUNTER — Inpatient Hospital Stay (HOSPITAL_BASED_OUTPATIENT_CLINIC_OR_DEPARTMENT_OTHER): Payer: Medicare Other | Admitting: Oncology

## 2022-02-25 ENCOUNTER — Other Ambulatory Visit: Payer: Self-pay

## 2022-02-25 ENCOUNTER — Inpatient Hospital Stay: Payer: Medicare Other | Attending: Oncology

## 2022-02-25 VITALS — BP 136/62 | HR 62 | Temp 97.8°F | Resp 16 | Ht 63.5 in | Wt 120.7 lb

## 2022-02-25 DIAGNOSIS — C680 Malignant neoplasm of urethra: Secondary | ICD-10-CM | POA: Insufficient documentation

## 2022-02-25 DIAGNOSIS — Z5112 Encounter for antineoplastic immunotherapy: Secondary | ICD-10-CM | POA: Insufficient documentation

## 2022-02-25 DIAGNOSIS — Z79899 Other long term (current) drug therapy: Secondary | ICD-10-CM | POA: Insufficient documentation

## 2022-02-25 DIAGNOSIS — Z95828 Presence of other vascular implants and grafts: Secondary | ICD-10-CM

## 2022-02-25 LAB — CBC WITH DIFFERENTIAL (CANCER CENTER ONLY)
Abs Immature Granulocytes: 0.02 10*3/uL (ref 0.00–0.07)
Basophils Absolute: 0 10*3/uL (ref 0.0–0.1)
Basophils Relative: 1 %
Eosinophils Absolute: 0.1 10*3/uL (ref 0.0–0.5)
Eosinophils Relative: 2 %
HCT: 34.9 % — ABNORMAL LOW (ref 36.0–46.0)
Hemoglobin: 12.1 g/dL (ref 12.0–15.0)
Immature Granulocytes: 0 %
Lymphocytes Relative: 23 %
Lymphs Abs: 1.5 10*3/uL (ref 0.7–4.0)
MCH: 33.2 pg (ref 26.0–34.0)
MCHC: 34.7 g/dL (ref 30.0–36.0)
MCV: 95.6 fL (ref 80.0–100.0)
Monocytes Absolute: 0.8 10*3/uL (ref 0.1–1.0)
Monocytes Relative: 12 %
Neutro Abs: 4.1 10*3/uL (ref 1.7–7.7)
Neutrophils Relative %: 62 %
Platelet Count: 223 10*3/uL (ref 150–400)
RBC: 3.65 MIL/uL — ABNORMAL LOW (ref 3.87–5.11)
RDW: 13.9 % (ref 11.5–15.5)
WBC Count: 6.5 10*3/uL (ref 4.0–10.5)
nRBC: 0 % (ref 0.0–0.2)

## 2022-02-25 LAB — CMP (CANCER CENTER ONLY)
ALT: 15 U/L (ref 0–44)
AST: 27 U/L (ref 15–41)
Albumin: 4.2 g/dL (ref 3.5–5.0)
Alkaline Phosphatase: 69 U/L (ref 38–126)
Anion gap: 7 (ref 5–15)
BUN: 13 mg/dL (ref 8–23)
CO2: 26 mmol/L (ref 22–32)
Calcium: 8.8 mg/dL — ABNORMAL LOW (ref 8.9–10.3)
Chloride: 104 mmol/L (ref 98–111)
Creatinine: 0.67 mg/dL (ref 0.44–1.00)
GFR, Estimated: >60 mL/min (ref >60)
Glucose, Bld: 93 mg/dL (ref 70–99)
Potassium: 3.6 mmol/L (ref 3.5–5.1)
Sodium: 137 mmol/L (ref 135–145)
Total Bilirubin: 0.9 mg/dL (ref 0.3–1.2)
Total Protein: 6.7 g/dL (ref 6.5–8.1)

## 2022-02-25 LAB — TSH: TSH: 1.563 u[IU]/mL (ref 0.350–4.500)

## 2022-02-25 MED ORDER — LIDOCAINE-PRILOCAINE 2.5-2.5 % EX CREA: 1.0000 "application " | TOPICAL_CREAM | CUTANEOUS | 3 refills | Status: AC | PRN

## 2022-02-25 MED ORDER — SODIUM CHLORIDE 0.9% FLUSH
10.0000 mL | INTRAVENOUS | Status: DC | PRN
  Administered 2022-02-25: 10 mL

## 2022-02-25 MED ORDER — HEPARIN SOD (PORK) LOCK FLUSH 100 UNIT/ML IV SOLN
500.0000 [IU] | Freq: Once | INTRAVENOUS | Status: AC | PRN
  Administered 2022-02-25: 500 [IU]

## 2022-02-25 MED ORDER — SODIUM CHLORIDE 0.9 % IV SOLN: Freq: Once | INTRAVENOUS | Status: AC

## 2022-02-25 MED ORDER — SODIUM CHLORIDE 0.9% FLUSH
10.0000 mL | Freq: Once | INTRAVENOUS | Status: AC
  Administered 2022-02-25: 10 mL

## 2022-02-25 MED ORDER — SODIUM CHLORIDE 0.9 % IV SOLN
480.0000 mg | Freq: Once | INTRAVENOUS | Status: AC
  Administered 2022-02-25: 480 mg via INTRAVENOUS
  Filled 2022-02-25: qty 48

## 2022-02-25 NOTE — Patient Instructions (Signed)
[facility]  Discharge Instructions: ?Thank you for choosing [facility] to provide your oncology and hematology care.  ? ?If you have a lab appointment with the [facility], please go directly to the [facility] and check in at the registration area. ?  ?Wear comfortable clothing and clothing appropriate for easy access to any Portacath or PICC line.  ? ?We strive to give you quality time with your provider. You may need to reschedule your appointment if you arrive late (15 or more minutes).  Arriving late affects you and other patients whose appointments are after yours.  Also, if you miss three or more appointments without notifying the office, you may be dismissed from the clinic at the provider?s discretion.    ?  ?For prescription refill requests, have your pharmacy contact our office and allow 72 hours for refills to be completed.   ? ?Today you received the following chemotherapy and/or immunotherapy agents: Nivolumab.    ?  ?To help prevent nausea and vomiting after your treatment, we encourage you to take your nausea medication as directed. ? ?BELOW ARE SYMPTOMS THAT SHOULD BE REPORTED IMMEDIATELY: ?*FEVER GREATER THAN 100.4 F (38 ?C) OR HIGHER ?*CHILLS OR SWEATING ?*NAUSEA AND VOMITING THAT IS NOT CONTROLLED WITH YOUR NAUSEA MEDICATION ?*UNUSUAL SHORTNESS OF BREATH ?*UNUSUAL BRUISING OR BLEEDING ?*URINARY PROBLEMS (pain or burning when urinating, or frequent urination) ?*BOWEL PROBLEMS (unusual diarrhea, constipation, pain near the anus) ?TENDERNESS IN MOUTH AND THROAT WITH OR WITHOUT PRESENCE OF ULCERS (sore throat, sores in mouth, or a toothache) ?UNUSUAL RASH, SWELLING OR PAIN  ?UNUSUAL VAGINAL DISCHARGE OR ITCHING  ? ?Items with * indicate a potential emergency and should be followed up as soon as possible or go to the Emergency Department if any problems should occur. ? ?Please show the CHEMOTHERAPY ALERT CARD or IMMUNOTHERAPY ALERT CARD at check-in to  the Emergency Department and triage nurse. ? ?Should you have questions after your visit or need to cancel or reschedule your appointment, please contact [facility]  Dept: [phone]  and follow the prompts.  Office hours are 8:00 a.m. to 4:30 p.m. Monday - Friday. Please note that voicemails left after 4:00 p.m. may not be returned until the following business day.  We are closed weekends and major holidays. You have access to a nurse at all times for urgent questions. Please call the main number to the clinic Dept: [phone] and follow the prompts. ? ? ?For any non-urgent questions, you may also contact your provider using MyChart. We now offer e-Visits for anyone 16 and older to request care online for non-urgent symptoms. For details visit [email or web address]. ?  ?Also download the MyChart app! Go to the app store, search "MyChart", open the app, select [facility], and log in with your MyChart username and password. ? ?Due to Covid, a mask is required upon entering the hospital/clinic. If you do not have a mask, one will be given to you upon arrival. For doctor visits, patients may have 1 support person aged 20 or older with them. For treatment visits, patients cannot have anyone with them due to current Covid guidelines and our immunocompromised population.  ? ?

## 2022-02-25 NOTE — Progress Notes (Signed)
Hematology and Oncology Follow Up Visit ? ?Wenda Low ?[id number] ?14-Feb-1951 71 y.o. ?02/25/2022 9:31 AM ?[name], MDEksir, [name], MD  ? ?Principle Diagnosis: 71 year old woman with urethral cancer diagnosed in 2021.  She presented with T4N1 high-grade urothelial tumor. ? ?Prior Therapy: ?She is status post cystoscopy and biopsy in November 2021 which showed high-grade urothelial carcinoma. ?Neoadjuvant chemotherapy utilizing gemcitabine and cisplatin started on October 29, 2020.  She completed 4 cycles of therapy on January 07, 2021. ?She is status post robotic assisted laparoscopic cystectomy, hysterectomy and lymph node dissection completed on Mar 04, 2021 completed by Dr. Tammi Klippel.  He final pathology showed T4N1 disease.  She had 1 out of 16 lymph node involvement. ? ?Current therapy: Nivolumab 480 mg every 4 weeks started in July 2022.  She is here for cycle 10 of therapy.  ? ? ?Interim History: Ms. Neuwirth returns today for a follow-up visit.  Since last visit, she reports feeling well without any major complaints.  She continues to tolerate current treatment without any complaints.  She denies any nausea, vomiting or abdominal pain.  She denies any skin rashes or lesions.  She denies any shortness of breath or difficulty breathing.  She denies any changes in her bowel habits. ? ?Medications: Updated on review. ?Current Outpatient Medications  ?Medication Sig Dispense Refill  ? acetaminophen (TYLENOL) 500 MG tablet Take 500 mg by mouth every 6 (six) hours as needed for moderate pain.    ? cycloSPORINE (RESTASIS) 0.05 % ophthalmic emulsion Place 1 drop into both eyes 2 (two) times daily.    ? folic acid (FOLVITE) 694 MCG tablet Take 1,600 mcg by mouth daily.     ? lidocaine-prilocaine (EMLA) cream Apply 1 application topically as needed. (Patient taking differently: Apply 1 application. topically as needed (for the port).) 30 g 0  ? Melatonin 10 MG TABS Take 10 mg by mouth at bedtime.    ? Melatonin  10 MG TBCR Take 10 mg by mouth See admin instructions.    ? mesalamine (CANASA) 1000 MG suppository Place 1,000 mg rectally every other day. At bedtime    ? methotrexate (RHEUMATREX) 2.5 MG tablet Take 20 mg by mouth every Tuesday.    ? Multiple Vitamin (MULTIVITAMIN WITH MINERALS) TABS tablet Take 1 tablet by mouth at bedtime. Silver    ? valACYclovir (VALTREX) 500 MG tablet Take 1 tablet (500 mg total) by mouth daily. 14 tablet 1  ? ?No current facility-administered medications for this visit.  ? ?Facility-Administered Medications Ordered in Other Visits  ?Medication Dose Route Frequency Provider Last Rate Last Admin  ? sodium chloride flush (NS) 0.9 % injection 10 mL  10 mL Intracatheter Once Wyatt Portela, MD      ? ? ? ?Allergies:  ?Allergies  ?Allergen Reactions  ? Codeine Nausea And Vomiting  ? Sulfa Antibiotics Nausea And Vomiting  ? Vicodin [Hydrocodone-Acetaminophen] Nausea And Vomiting  ? ? ? ? ?Physical Exam: ? ? ? ? ? ?Blood pressure 136/62, pulse 62, temperature 97.8 ?F (36.6 ?C), temperature source Temporal, resp. rate 16, height 5' 3.5" (1.613 m), weight 120 lb 11.2 oz (54.7 kg), SpO2 100 %. ? ? ? ? ? ? ? ? ?ECOG: 0 ? ? ?General appearance: Comfortable appearing without any discomfort ?Head: Normocephalic without any trauma ?Oropharynx: Mucous membranes are moist and pink without any thrush or ulcers. ?Eyes: Pupils are equal and round reactive to light. ?Lymph nodes: No cervical, supraclavicular, inguinal or axillary lymphadenopathy.   ?Heart:regular  rate and rhythm.  S1 and S2 without leg edema. ?Lung: Clear without any rhonchi or wheezes.  No dullness to percussion. ?Abdomin: Soft, nontender, nondistended with good bowel sounds.  No hepatosplenomegaly. ?Musculoskeletal: No joint deformity or effusion.  Full range of motion noted. ?Neurological: No deficits noted on motor, sensory and deep tendon reflex exam. ?Skin: No petechial rash or dryness.  Appeared moist.  ? ? ? ? ? ? ? ? ? ? ? ? ? ? ?Lab  Results: ?Lab Results  ?Component Value Date  ? WBC 6.6 01/29/2022  ? HGB 11.9 (L) 01/29/2022  ? HCT 33.0 (L) 01/29/2022  ? MCV 93.5 01/29/2022  ? PLT 215 01/29/2022  ? ?  Chemistry   ?   ?Component Value Date/Time  ? NA 137 01/29/2022 1140  ? K 3.8 01/29/2022 1140  ? CL 104 01/29/2022 1140  ? CO2 25 01/29/2022 1140  ? BUN 15 01/29/2022 1140  ? CREATININE 0.65 01/29/2022 1140  ?    ?Component Value Date/Time  ? CALCIUM 9.1 01/29/2022 1140  ? ALKPHOS 69 01/29/2022 1140  ? AST 29 01/29/2022 1140  ? ALT 15 01/29/2022 1140  ? BILITOT 1.6 (H) 01/29/2022 1140  ?  ? ? ? ? ? ? ? ?Impression and Plan: ? ?71 year old woman with: ?  ?1.  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 ? ?Treatment options moving forward were discussed at this time.  Risks and benefits of continuing nivolumab treatment were reiterated.  Complications including autoimmune issues and GI toxicity were reviewed.  She is agreeable to continue at this time and the plan to update her staging scans in September.  The duration of therapy were reviewed and tentatively she will complete 12 cycles.  She is agreeable to proceed. ? ? ?2.  IV access: Port-A-Cath continues to be in use without any issues. ?  ?3.  Antiemetics: No nausea or vomiting reported at this time.  Compazine is available to her. ? ? ?4.  Immune mediated complications: I continue to educate her about potential complications including pneumonitis, colitis and thyroid disease. ? ? ?5.  Goals of care: Aggressive measures are warranted at this time.  Treatment is curative. ? ? ? 6.  Follow-up: She will return in 1 month for the next cycle of therapy. ?  ?30  minutes were spent on this visit.  The time was dedicated to discussing treatment choices, disease status update, addressing complication related to cancer and cancer therapy. ? ?[name], MD ?4/27/20239:31 AM ? ?

## 2022-03-02 ENCOUNTER — Telehealth: Payer: Self-pay | Admitting: Oncology

## 2022-03-02 NOTE — Telephone Encounter (Signed)
Scheduled per 04/28 los, patient has been called and notified. ?

## 2022-03-26 ENCOUNTER — Ambulatory Visit: Payer: Medicare Other | Admitting: Oncology

## 2022-03-26 ENCOUNTER — Ambulatory Visit: Payer: Medicare Other

## 2022-03-26 ENCOUNTER — Other Ambulatory Visit: Payer: Medicare Other

## 2022-03-31 ENCOUNTER — Inpatient Hospital Stay (HOSPITAL_BASED_OUTPATIENT_CLINIC_OR_DEPARTMENT_OTHER): Payer: Medicare Other | Admitting: Oncology

## 2022-03-31 ENCOUNTER — Other Ambulatory Visit: Payer: Self-pay

## 2022-03-31 ENCOUNTER — Inpatient Hospital Stay: Payer: Medicare Other | Attending: Oncology

## 2022-03-31 ENCOUNTER — Inpatient Hospital Stay: Payer: Medicare Other

## 2022-03-31 VITALS — BP 129/66 | HR 73 | Temp 97.9°F | Resp 15 | Wt 120.9 lb

## 2022-03-31 DIAGNOSIS — C680 Malignant neoplasm of urethra: Secondary | ICD-10-CM | POA: Diagnosis not present

## 2022-03-31 DIAGNOSIS — Z95828 Presence of other vascular implants and grafts: Secondary | ICD-10-CM

## 2022-03-31 DIAGNOSIS — Z79899 Other long term (current) drug therapy: Secondary | ICD-10-CM | POA: Diagnosis not present

## 2022-03-31 DIAGNOSIS — Z5112 Encounter for antineoplastic immunotherapy: Secondary | ICD-10-CM | POA: Diagnosis present

## 2022-03-31 LAB — CBC WITH DIFFERENTIAL (CANCER CENTER ONLY)
Abs Immature Granulocytes: 0.03 10*3/uL (ref 0.00–0.07)
Basophils Absolute: 0 10*3/uL (ref 0.0–0.1)
Basophils Relative: 1 %
Eosinophils Absolute: 0.1 10*3/uL (ref 0.0–0.5)
Eosinophils Relative: 1 %
HCT: 34.9 % — ABNORMAL LOW (ref 36.0–46.0)
Hemoglobin: 12.5 g/dL (ref 12.0–15.0)
Immature Granulocytes: 0 %
Lymphocytes Relative: 19 %
Lymphs Abs: 1.6 10*3/uL (ref 0.7–4.0)
MCH: 34.1 pg — ABNORMAL HIGH (ref 26.0–34.0)
MCHC: 35.8 g/dL (ref 30.0–36.0)
MCV: 95.1 fL (ref 80.0–100.0)
Monocytes Absolute: 1.2 10*3/uL — ABNORMAL HIGH (ref 0.1–1.0)
Monocytes Relative: 14 %
Neutro Abs: 5.6 10*3/uL (ref 1.7–7.7)
Neutrophils Relative %: 65 %
Platelet Count: 213 10*3/uL (ref 150–400)
RBC: 3.67 MIL/uL — ABNORMAL LOW (ref 3.87–5.11)
RDW: 13.8 % (ref 11.5–15.5)
WBC Count: 8.6 10*3/uL (ref 4.0–10.5)
nRBC: 0 % (ref 0.0–0.2)

## 2022-03-31 LAB — CMP (CANCER CENTER ONLY)
ALT: 13 U/L (ref 0–44)
AST: 26 U/L (ref 15–41)
Albumin: 4.2 g/dL (ref 3.5–5.0)
Alkaline Phosphatase: 66 U/L (ref 38–126)
Anion gap: 8 (ref 5–15)
BUN: 10 mg/dL (ref 8–23)
CO2: 26 mmol/L (ref 22–32)
Calcium: 9.5 mg/dL (ref 8.9–10.3)
Chloride: 103 mmol/L (ref 98–111)
Creatinine: 0.74 mg/dL (ref 0.44–1.00)
GFR, Estimated: >60 mL/min (ref >60)
Glucose, Bld: 98 mg/dL (ref 70–99)
Potassium: 4.1 mmol/L (ref 3.5–5.1)
Sodium: 137 mmol/L (ref 135–145)
Total Bilirubin: 1.1 mg/dL (ref 0.3–1.2)
Total Protein: 7 g/dL (ref 6.5–8.1)

## 2022-03-31 LAB — TSH: TSH: 2.281 u[IU]/mL (ref 0.350–4.500)

## 2022-03-31 MED ORDER — SODIUM CHLORIDE 0.9 % IV SOLN: Freq: Once | INTRAVENOUS | Status: AC

## 2022-03-31 MED ORDER — SODIUM CHLORIDE 0.9% FLUSH
10.0000 mL | INTRAVENOUS | Status: DC | PRN
  Administered 2022-03-31: 10 mL

## 2022-03-31 MED ORDER — HEPARIN SOD (PORK) LOCK FLUSH 100 UNIT/ML IV SOLN
500.0000 [IU] | Freq: Once | INTRAVENOUS | Status: AC | PRN
  Administered 2022-03-31: 500 [IU]

## 2022-03-31 MED ORDER — SODIUM CHLORIDE 0.9% FLUSH
10.0000 mL | Freq: Once | INTRAVENOUS | Status: AC
  Administered 2022-03-31: 10 mL

## 2022-03-31 MED ORDER — SODIUM CHLORIDE 0.9 % IV SOLN
480.0000 mg | Freq: Once | INTRAVENOUS | Status: AC
  Administered 2022-03-31: 480 mg via INTRAVENOUS
  Filled 2022-03-31: qty 48

## 2022-03-31 NOTE — Progress Notes (Signed)
Hematology and Oncology Follow Up Visit  Sandra Mann [id number] [birth year] 71 y.o. 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    Prior Therapy: She is status post cystoscopy and biopsy in November 2021 which showed high-grade urothelial carcinoma. Neoadjuvant chemotherapy utilizing gemcitabine and cisplatin started on October 29, 2020.  She completed 4 cycles of therapy on January 07, 2021. She is status post robotic assisted laparoscopic cystectomy, hysterectomy and lymph node dissection completed on Mar 04, 2021 completed by Dr. Tammi Mann.  He final pathology showed T4N1 disease.  She had 1 out of 16 lymph node involvement.  Current therapy: Nivolumab 480 mg every 4 weeks started in July 2022.  She is here for cycle 11 of therapy.    Interim History: [name] returns today for a follow-up evaluation.  Since last visit, she reports feeling well without any Mann complaints.  She denies any complications related to current therapy.  She denies any nausea, fatigue or skin rash.  She denies any diarrhea or respiratory complaints.  Her performance status quality of life remain excellent.  Medications: Reviewed without changes. Current Outpatient Medications  Medication Sig Dispense Refill   acetaminophen (TYLENOL) 500 MG tablet Take 500 mg by mouth every 6 (six) hours as needed for moderate pain.     cycloSPORINE (RESTASIS) 0.05 % ophthalmic emulsion Place 1 drop into both eyes 2 (two) times daily.     folic acid (FOLVITE) 403 MCG tablet Take 1,600 mcg by mouth daily.      lidocaine-prilocaine (EMLA) cream Apply 1 application. topically as needed. 30 g 3   Melatonin 10 MG TABS Take 10 mg by mouth at bedtime.     Melatonin 10 MG TBCR Take 10 mg by mouth See admin instructions.     mesalamine (CANASA) 1000 MG suppository Place 1,000 mg rectally every other day. At  bedtime     methotrexate (RHEUMATREX) 2.5 MG tablet Take 20 mg by mouth every Tuesday.     Multiple Vitamin (MULTIVITAMIN WITH MINERALS) TABS tablet Take 1 tablet by mouth at bedtime. Silver     valACYclovir (VALTREX) 500 MG tablet Take 1 tablet (500 mg total) by mouth daily. 14 tablet 1   No current facility-administered medications for this visit.   Facility-Administered Medications Ordered in Other Visits  Medication Dose Route Frequency Provider Last Rate Last Admin   sodium chloride flush (NS) 0.9 % injection 10 mL  10 mL Intracatheter Once Sandra Portela, MD         Allergies:  Allergies  Allergen Reactions   Codeine Nausea And Vomiting   Sulfa Antibiotics Nausea And Vomiting   Vicodin [Hydrocodone-Acetaminophen] Nausea And Vomiting      Physical Exam:   Blood pressure 129/66, pulse 73, temperature 97.9 F (36.6 C), temperature source Tympanic, resp. rate 15, weight 120 lb 14.4 oz (54.8 kg), SpO2 98 %.    ECOG: 0   General appearance: Alert, awake without any distress. Head: Atraumatic without abnormalities Oropharynx: Without any thrush or ulcers. Eyes: No scleral icterus. Lymph nodes: No lymphadenopathy noted in the cervical, supraclavicular, or axillary nodes Heart:regular rate and rhythm, without any murmurs or gallops.   Lung: Clear to auscultation without any rhonchi, wheezes or dullness to percussion. Abdomin: Soft, nontender without any shifting dullness or ascites. Musculoskeletal: No clubbing or cyanosis. Neurological: No motor or sensory deficits. Skin: No rashes or lesions.  Lab Results: Lab Results  Component Value Date   WBC 6.5 02/25/2022   HGB 12.1 02/25/2022   HCT 34.9 (L) 02/25/2022   MCV 95.6 02/25/2022   PLT 223 02/25/2022     Chemistry      Component Value Date/Time   NA 137 02/25/2022 0933   K 3.6 02/25/2022 0933   CL 104 02/25/2022 0933   CO2 26 02/25/2022 0933   BUN 13 02/25/2022 0933    CREATININE 0.67 02/25/2022 0933      Component Value Date/Time   CALCIUM 8.8 (L) 02/25/2022 0933   ALKPHOS 69 02/25/2022 0933   AST 27 02/25/2022 0933   ALT 15 02/25/2022 0933   BILITOT 0.9 02/25/2022 0933           Impression and Plan:  71 year old woman with:   1.  Urethral cancer diagnosed in 2021.  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  She continues to be on nivolumab maintenance therapy given her high risk disease without any complications.  Risks and benefits of proceeding with the last 2 cycles before proceeding with observation were discussed.  Plan is to update her staging scans in September.  She is agreeable to proceed.   2.  IV access: Port-A-Cath will continue to be in use and flush periodically after treatment.  Risks and benefits of Port-A-Cath removal were discussed today and we opted to keep it even beyond the last treatment.  We will consider removal after the next scan or potentially next year.   3.  Antiemetics: Compazine is available to her without any nausea or vomiting.   4.  Immune mediated complications: She has not experienced any issues including pneumonitis, colitis and thyroid disease.   5.  Goals of care: Aggressive measures are warranted given her reasonable performance status and potentially curable disease.    6.  Follow-up: He will return in 1 month for the last cycle of nivolumab.   30  minutes were dedicated to this encounter.  The time was spent on reviewing laboratory data, disease status update and addressing complication related to cancer and cancer therapy.  Sandra Button, MD 5/31/20238:12 AM

## 2022-03-31 NOTE — Patient Instructions (Signed)
[facility] ONCOLOGY  Discharge Instructions: Thank you for choosing [facility] to provide your oncology and hematology care.   If you have a lab appointment with the [facility], please go directly to the [facility] and check in at the registration area.   Wear comfortable clothing and clothing appropriate for easy access to any Portacath or PICC line.   We strive to give you quality time with your provider. You may need to reschedule your appointment if you arrive late (15 or more minutes).  Arriving late affects you and other patients whose appointments are after yours.  Also, if you miss three or more appointments without notifying the office, you may be dismissed from the clinic at the provider's discretion.      For prescription refill requests, have your pharmacy contact our office and allow 72 hours for refills to be completed.    Today you received the following chemotherapy and/or immunotherapy agents: Nivolumab.      To help prevent nausea and vomiting after your treatment, we encourage you to take your nausea medication as directed.  BELOW ARE SYMPTOMS THAT SHOULD BE REPORTED IMMEDIATELY: *FEVER GREATER THAN 100.4 F (38 C) OR HIGHER *CHILLS OR SWEATING *NAUSEA AND VOMITING THAT IS NOT CONTROLLED WITH YOUR NAUSEA MEDICATION *UNUSUAL SHORTNESS OF BREATH *UNUSUAL BRUISING OR BLEEDING *URINARY PROBLEMS (pain or burning when urinating, or frequent urination) *BOWEL PROBLEMS (unusual diarrhea, constipation, pain near the anus) TENDERNESS IN MOUTH AND THROAT WITH OR WITHOUT PRESENCE OF ULCERS (sore throat, sores in mouth, or a toothache) UNUSUAL RASH, SWELLING OR PAIN  UNUSUAL VAGINAL DISCHARGE OR ITCHING   Items with * indicate a potential emergency and should be followed up as soon as possible or go to the Emergency Department if any problems should occur.  Please show the CHEMOTHERAPY ALERT CARD or IMMUNOTHERAPY ALERT CARD at check-in to  the Emergency Department and triage nurse.  Should you have questions after your visit or need to cancel or reschedule your appointment, please contact [facility]  Dept: [phone]  and follow the prompts.  Office hours are 8:00 a.m. to 4:30 p.m. Monday - Friday. Please note that voicemails left after 4:00 p.m. may not be returned until the following business day.  We are closed weekends and major holidays. You have access to a nurse at all times for urgent questions. Please call the main number to the clinic Dept: [phone] and follow the prompts.   For any non-urgent questions, you may also contact your provider using MyChart. We now offer e-Visits for anyone 25 and older to request care online for non-urgent symptoms. For details visit [email or web address].   Also download the MyChart app! Go to the app store, search "MyChart", open the app, select [facility], and log in with your MyChart username and password.  Due to Covid, a mask is required upon entering the hospital/clinic. If you do not have a mask, one will be given to you upon arrival. For doctor visits, patients may have 1 support person aged 66 or older with them. For treatment visits, patients cannot have anyone with them due to current Covid guidelines and our immunocompromised population.

## 2022-04-28 ENCOUNTER — Inpatient Hospital Stay: Payer: Medicare Other | Attending: Oncology | Admitting: Oncology

## 2022-04-28 ENCOUNTER — Inpatient Hospital Stay: Payer: Medicare Other

## 2022-04-28 ENCOUNTER — Other Ambulatory Visit: Payer: Self-pay

## 2022-04-28 VITALS — BP 123/64 | HR 72 | Temp 98.1°F | Resp 15 | Ht 63.5 in | Wt 120.0 lb

## 2022-04-28 DIAGNOSIS — Z79899 Other long term (current) drug therapy: Secondary | ICD-10-CM | POA: Diagnosis not present

## 2022-04-28 DIAGNOSIS — C680 Malignant neoplasm of urethra: Secondary | ICD-10-CM | POA: Insufficient documentation

## 2022-04-28 DIAGNOSIS — Z95828 Presence of other vascular implants and grafts: Secondary | ICD-10-CM

## 2022-04-28 DIAGNOSIS — Z5112 Encounter for antineoplastic immunotherapy: Secondary | ICD-10-CM | POA: Insufficient documentation

## 2022-04-28 LAB — CBC WITH DIFFERENTIAL (CANCER CENTER ONLY)
Abs Immature Granulocytes: 0.02 10*3/uL (ref 0.00–0.07)
Basophils Absolute: 0 10*3/uL (ref 0.0–0.1)
Basophils Relative: 0 %
Eosinophils Absolute: 0.1 10*3/uL (ref 0.0–0.5)
Eosinophils Relative: 1 %
HCT: 33.6 % — ABNORMAL LOW (ref 36.0–46.0)
Hemoglobin: 12 g/dL (ref 12.0–15.0)
Immature Granulocytes: 0 %
Lymphocytes Relative: 23 %
Lymphs Abs: 1.6 10*3/uL (ref 0.7–4.0)
MCH: 33.8 pg (ref 26.0–34.0)
MCHC: 35.7 g/dL (ref 30.0–36.0)
MCV: 94.6 fL (ref 80.0–100.0)
Monocytes Absolute: 1 10*3/uL (ref 0.1–1.0)
Monocytes Relative: 14 %
Neutro Abs: 4.2 10*3/uL (ref 1.7–7.7)
Neutrophils Relative %: 62 %
Platelet Count: 205 10*3/uL (ref 150–400)
RBC: 3.55 MIL/uL — ABNORMAL LOW (ref 3.87–5.11)
RDW: 13.3 % (ref 11.5–15.5)
WBC Count: 6.9 10*3/uL (ref 4.0–10.5)
nRBC: 0 % (ref 0.0–0.2)

## 2022-04-28 LAB — CMP (CANCER CENTER ONLY)
ALT: 13 U/L (ref 0–44)
AST: 24 U/L (ref 15–41)
Albumin: 4.2 g/dL (ref 3.5–5.0)
Alkaline Phosphatase: 62 U/L (ref 38–126)
Anion gap: 7 (ref 5–15)
BUN: 13 mg/dL (ref 8–23)
CO2: 26 mmol/L (ref 22–32)
Calcium: 9.1 mg/dL (ref 8.9–10.3)
Chloride: 105 mmol/L (ref 98–111)
Creatinine: 0.77 mg/dL (ref 0.44–1.00)
GFR, Estimated: >60 mL/min (ref >60)
Glucose, Bld: 90 mg/dL (ref 70–99)
Potassium: 3.7 mmol/L (ref 3.5–5.1)
Sodium: 138 mmol/L (ref 135–145)
Total Bilirubin: 1 mg/dL (ref 0.3–1.2)
Total Protein: 6.4 g/dL — ABNORMAL LOW (ref 6.5–8.1)

## 2022-04-28 LAB — TSH: TSH: 1.552 u[IU]/mL (ref 0.350–4.500)

## 2022-04-28 MED ORDER — SODIUM CHLORIDE 0.9 % IV SOLN
480.0000 mg | Freq: Once | INTRAVENOUS | Status: AC
  Administered 2022-04-28: 480 mg via INTRAVENOUS
  Filled 2022-04-28: qty 48

## 2022-04-28 MED ORDER — SODIUM CHLORIDE 0.9% FLUSH
10.0000 mL | Freq: Once | INTRAVENOUS | Status: AC
  Administered 2022-04-28: 10 mL

## 2022-04-28 MED ORDER — HEPARIN SOD (PORK) LOCK FLUSH 100 UNIT/ML IV SOLN
500.0000 [IU] | Freq: Once | INTRAVENOUS | Status: AC | PRN
  Administered 2022-04-28: 500 [IU]

## 2022-04-28 MED ORDER — SODIUM CHLORIDE 0.9% FLUSH
10.0000 mL | INTRAVENOUS | Status: DC | PRN
  Administered 2022-04-28: 10 mL

## 2022-04-28 MED ORDER — SODIUM CHLORIDE 0.9 % IV SOLN: Freq: Once | INTRAVENOUS | Status: AC

## 2022-04-28 NOTE — Progress Notes (Signed)
Hematology and Oncology Follow Up Visit  Sandra Mann [id number] 1951/02/08 71 y.o. 04/28/2022 11:31 AM Daron Offer, [name], MDEksir, [name], MD   Principle Diagnosis: 71 year old woman with urethral cancer diagnosed in 2021.  She presented with T4N1 high-grade urothelial tumor at that time.  Prior Therapy: She is status post cystoscopy and biopsy in November 2021 which showed high-grade urothelial carcinoma. Neoadjuvant chemotherapy utilizing gemcitabine and cisplatin started on October 29, 2020.  She completed 4 cycles of therapy on January 07, 2021. She is status post robotic assisted laparoscopic cystectomy, hysterectomy and lymph node dissection completed on Mar 04, 2021 completed by Dr. Tammi Klippel.  He final pathology showed T4N1 disease.  She had 1 out of 16 lymph node involvement.  Current therapy: Nivolumab 480 mg every 4 weeks started in July 2022.  She is here for cycle 12 of therapy.    Interim History: Sandra Mann is here for a follow-up visit.  Since last visit, she reports feeling well without any major complaints.  She continues to tolerate nivolumab without any issues or concerns.  She denies any nausea, vomiting or abdominal pain.  She denies any skin rashes or lesions.  Medications: Reviewed without changes. Current Outpatient Medications  Medication Sig Dispense Refill   acetaminophen (TYLENOL) 500 MG tablet Take 500 mg by mouth every 6 (six) hours as needed for moderate pain.     cycloSPORINE (RESTASIS) 0.05 % ophthalmic emulsion Place 1 drop into both eyes 2 (two) times daily.     folic acid (FOLVITE) 854 MCG tablet Take 1,600 mcg by mouth daily.      lidocaine-prilocaine (EMLA) cream Apply 1 application. topically as needed. 30 g 3   Melatonin 10 MG TABS Take 10 mg by mouth at bedtime.     Melatonin 10 MG TBCR Take 10 mg by mouth See admin instructions.     mesalamine (CANASA) 1000 MG suppository Place 1,000 mg rectally every other day. At bedtime     methotrexate  (RHEUMATREX) 2.5 MG tablet Take 20 mg by mouth every Tuesday.     Multiple Vitamin (MULTIVITAMIN WITH MINERALS) TABS tablet Take 1 tablet by mouth at bedtime. Silver     valACYclovir (VALTREX) 500 MG tablet Take 1 tablet (500 mg total) by mouth daily. 14 tablet 1   No current facility-administered medications for this visit.     Allergies:  Allergies  Allergen Reactions   Codeine Nausea And Vomiting   Sulfa Antibiotics Nausea And Vomiting   Vicodin [Hydrocodone-Acetaminophen] Nausea And Vomiting      Physical Exam:    Blood pressure 123/64, pulse 72, temperature 98.1 F (36.7 C), temperature source Tympanic, resp. rate 15, height 5' 3.5" (1.613 m), weight 120 lb (54.4 kg), SpO2 100 %.    ECOG: 0   General appearance: Comfortable appearing without any discomfort Head: Normocephalic without any trauma Oropharynx: Mucous membranes are moist and pink without any thrush or ulcers. Eyes: Pupils are equal and round reactive to light. Lymph nodes: No cervical, supraclavicular, inguinal or axillary lymphadenopathy.   Heart:regular rate and rhythm.  S1 and S2 without leg edema. Lung: Clear without any rhonchi or wheezes.  No dullness to percussion. Abdomin: Soft, nontender, nondistended with good bowel sounds.  No hepatosplenomegaly. Musculoskeletal: No joint deformity or effusion.  Full range of motion noted. Neurological: No deficits noted on motor, sensory and deep tendon reflex exam. Skin: No petechial rash or dryness.  Appeared moist.  Lab Results: Lab Results  Component Value Date   WBC 8.6 03/31/2022   HGB 12.5 03/31/2022   HCT 34.9 (L) 03/31/2022   MCV 95.1 03/31/2022   PLT 213 03/31/2022     Chemistry      Component Value Date/Time   NA 137 03/31/2022 0820   K 4.1 03/31/2022 0820   CL 103 03/31/2022 0820   CO2 26 03/31/2022 0820   BUN 10 03/31/2022 0820   CREATININE 0.74 03/31/2022 0820      Component Value Date/Time   CALCIUM  9.5 03/31/2022 0820   ALKPHOS 66 03/31/2022 0820   AST 26 03/31/2022 0820   ALT 13 03/31/2022 0820   BILITOT 1.1 03/31/2022 0820           Impression and Plan:  71 year old woman with:   1.  T4N1 urethral cancer diagnosed in 2021.  She was found to have high-grade urothelial cancer.  The natural course of her disease was reviewed and treatment choices were reviewed.  Risks and benefits of proceeding with nivolumab for the last cycle of treatment were discussed.  Salvage therapy option utilizing antibody drug conjugate as well as oral targeted therapy among others were reviewed and will be used as salvage.  Plan is to update her staging scans in September 2023.  She is agreeable to proceed.   2.  IV access: Port-A-Cath will continue to be flushed and managed periodically.   3.  Antiemetics: No nausea or vomiting reported at this time.  Compazine is available to her.   4.  Immune mediated complications: I continue to educate her about potential complication clued pneumonitis, colitis and thyroid disease.   5.  Goals of care: Therapy is curative at this time and aggressive measures are warranted.    6.  Follow-up: She will return in September for repeat evaluation and updating her staging scans.   30  minutes were spent on this visit.  The time was dedicated to reviewing disease status, treatment choices and addressing complications related to her cancer and cancer therapy.  [name], MD 6/28/202311:31 AM

## 2022-04-28 NOTE — Patient Instructions (Signed)
[facility] ONCOLOGY   Discharge Instructions: Thank you for choosing [facility] to provide your oncology and hematology care.   If you have a lab appointment with the [facility], please go directly to the [facility] and check in at the registration area.   Wear comfortable clothing and clothing appropriate for easy access to any Portacath or PICC line.   We strive to give you quality time with your provider. You may need to reschedule your appointment if you arrive late (15 or more minutes).  Arriving late affects you and other patients whose appointments are after yours.  Also, if you miss three or more appointments without notifying the office, you may be dismissed from the clinic at the provider's discretion.      For prescription refill requests, have your pharmacy contact our office and allow 72 hours for refills to be completed.    Today you received the following chemotherapy and/or immunotherapy agents: nivolumab      To help prevent nausea and vomiting after your treatment, we encourage you to take your nausea medication as directed.  BELOW ARE SYMPTOMS THAT SHOULD BE REPORTED IMMEDIATELY: *FEVER GREATER THAN 100.4 F (38 C) OR HIGHER *CHILLS OR SWEATING *NAUSEA AND VOMITING THAT IS NOT CONTROLLED WITH YOUR NAUSEA MEDICATION *UNUSUAL SHORTNESS OF BREATH *UNUSUAL BRUISING OR BLEEDING *URINARY PROBLEMS (pain or burning when urinating, or frequent urination) *BOWEL PROBLEMS (unusual diarrhea, constipation, pain near the anus) TENDERNESS IN MOUTH AND THROAT WITH OR WITHOUT PRESENCE OF ULCERS (sore throat, sores in mouth, or a toothache) UNUSUAL RASH, SWELLING OR PAIN  UNUSUAL VAGINAL DISCHARGE OR ITCHING   Items with * indicate a potential emergency and should be followed up as soon as possible or go to the Emergency Department if any problems should occur.  Please show the CHEMOTHERAPY ALERT CARD or IMMUNOTHERAPY ALERT CARD at check-in  to the Emergency Department and triage nurse.  Should you have questions after your visit or need to cancel or reschedule your appointment, please contact [facility]  Dept: [phone]  and follow the prompts.  Office hours are 8:00 a.m. to 4:30 p.m. Monday - Friday. Please note that voicemails left after 4:00 p.m. may not be returned until the following business day.  We are closed weekends and major holidays. You have access to a nurse at all times for urgent questions. Please call the main number to the clinic Dept: [phone] and follow the prompts.   For any non-urgent questions, you may also contact your provider using MyChart. We now offer e-Visits for anyone 68 and older to request care online for non-urgent symptoms. For details visit [email or web address].   Also download the MyChart app! Go to the app store, search "MyChart", open the app, select [facility], and log in with your MyChart username and password.  Masks are optional in the cancer centers. If you would like for your care team to wear a mask while they are taking care of you, please let them know. For doctor visits, patients may have with them one support person who is at least 69 years old. At this time, visitors are not allowed in the infusion area.

## 2022-05-12 ENCOUNTER — Telehealth: Payer: Self-pay | Admitting: Oncology

## 2022-05-12 NOTE — Telephone Encounter (Signed)
Called patient regarding upcoming September appointment, patient has been called and notified.

## 2022-05-21 ENCOUNTER — Other Ambulatory Visit: Payer: Self-pay | Admitting: Orthopedic Surgery

## 2022-05-21 ENCOUNTER — Other Ambulatory Visit (HOSPITAL_COMMUNITY): Payer: Self-pay | Admitting: Orthopedic Surgery

## 2022-05-21 DIAGNOSIS — Z96611 Presence of right artificial shoulder joint: Secondary | ICD-10-CM

## 2022-05-24 ENCOUNTER — Other Ambulatory Visit: Payer: Self-pay

## 2022-06-01 ENCOUNTER — Ambulatory Visit (HOSPITAL_COMMUNITY)
Admission: RE | Admit: 2022-06-01 | Discharge: 2022-06-01 | Disposition: A | Discharge status: Home / Self Care | Payer: Medicare Other | Source: Ambulatory Visit | Attending: Orthopedic Surgery | Admitting: Orthopedic Surgery

## 2022-06-01 ENCOUNTER — Encounter (HOSPITAL_COMMUNITY)
Admission: RE | Admit: 2022-06-01 | Discharge: 2022-06-01 | Disposition: A | Discharge status: Home / Self Care | Payer: Medicare Other | Source: Ambulatory Visit | Attending: Orthopedic Surgery | Admitting: Orthopedic Surgery

## 2022-06-01 DIAGNOSIS — Z96611 Presence of right artificial shoulder joint: Secondary | ICD-10-CM | POA: Diagnosis not present

## 2022-06-01 MED ORDER — TECHNETIUM TC 99M MEDRONATE IV KIT
20.0000 | PACK | Freq: Once | INTRAVENOUS | Status: AC | PRN
  Administered 2022-06-01: 21.8 via INTRAVENOUS

## 2022-06-16 ENCOUNTER — Ambulatory Visit: Payer: Medicare Other | Attending: Family Medicine | Admitting: Physical Therapy

## 2022-06-16 ENCOUNTER — Encounter: Payer: Self-pay | Admitting: Physical Therapy

## 2022-06-16 ENCOUNTER — Other Ambulatory Visit: Payer: Self-pay

## 2022-06-16 DIAGNOSIS — M25612 Stiffness of left shoulder, not elsewhere classified: Secondary | ICD-10-CM | POA: Insufficient documentation

## 2022-06-16 DIAGNOSIS — M25512 Pain in left shoulder: Secondary | ICD-10-CM | POA: Diagnosis present

## 2022-06-16 DIAGNOSIS — R293 Abnormal posture: Secondary | ICD-10-CM | POA: Insufficient documentation

## 2022-06-16 DIAGNOSIS — M542 Cervicalgia: Secondary | ICD-10-CM | POA: Insufficient documentation

## 2022-06-16 NOTE — Therapy (Addendum)
OUTPATIENT PHYSICAL THERAPY CERVICAL EVALUATION   Patient Name: Sandra Mann MRN: [id number] DOB:1951/09/29, 71 y.o., female Today's Date: 06/16/2022   PT End of Session - 06/16/22 0951     Visit Number 1    Number of Visits 12    Date for PT Re-Evaluation 07/14/22    Authorization Type FOTO AT LEAST EVERY 5TH VISIT.  PROGRESS NOTE AT 10TH VISIT.  KX MODIFIER AFTER 15 VISITS.    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   Wears glasses    Past Surgical History:  Procedure Laterality Date   COLONOSCOPY     CYSTOSCOPY WITH INJECTION N/A 03/04/2021   Procedure: CYSTOSCOPY WITH INJECTION OF INDOCYANINE GREEN DYE;  Surgeon: Alexis Frock, MD;  [facility];  Service: Urology;  Laterality: N/A;   CYSTOSCOPY WITH URETHRAL DILATATION N/A 09/23/2020   Procedure: CYSTOSCOPY WITH URETHRAL DILATATION, EXAM UNDER ANESTHESIA, BIOPSY OF URETHRAL MASS;  Surgeon: Robley Fries, MD;  [facility];  Service: Urology;  Laterality: N/A;   EXCISION ORAL TUMOR Left 03/27/2014   Procedure: EXCISION OF ORAL CANCER;  Surgeon: Izora Gala, MD;  Location: [place];  Service: ENT;  Laterality: Left;   FINGER GANGLION CYST EXCISION Right 2007   small dip   INCISION AND DRAINAGE ABSCESS Left 06/12/2014   Procedure: LEFT INCISION AND DRAINAGE MANDIBULAR ABSCESS;  Surgeon: Izora Gala, MD;  Location: [place];  Service: ENT;  Laterality: Left;   INCISION AND  DRAINAGE ABSCESS Left 11/22/2014   Procedure: INCISION AND DRAINAGE LEFT  MANDIBULAR ABSCESS;  Surgeon: Izora Gala, MD;  Location: [place];  Service: ENT;  Laterality: Left;   INCISION AND DRAINAGE OF PERITONSILLAR ABCESS Left 06/01/2014   Procedure: INCISION AND DRAINAGE Masticator Space Infection;  Surgeon: Izora Gala, MD;  Location: [place];  Service: ENT;  Laterality: Left;   IR IMAGING GUIDED PORT INSERTION  10/21/2020   LYMPH NODE DISSECTION Bilateral 03/04/2021   Procedure: LYMPH NODE DISSECTION;  Surgeon: Alexis Frock, MD;  [facility];  Service: Urology;  Laterality: Bilateral;   MULTIPLE EXTRACTIONS WITH ALVEOLOPLASTY Left 03/27/2014   Procedure: EXTRACTIONS OF NECESSARY TOOTH ;  Surgeon: Ceasar Mons, DDS;  Location: [place];  Service: Oral Surgery;  Laterality: Left;   REVERSE SHOULDER ARTHROPLASTY Left 09/03/2021   Procedure: REVERSE SHOULDER ARTHROPLASTY;  Surgeon: Justice Britain, MD;  [facility];  Service: Orthopedics;  Laterality: Left;   ROBOT ASSISTED LAPAROSCOPIC COMPLETE CYSTECT ILEAL CONDUIT N/A 03/04/2021   Procedure: XI ROBOTIC ASSISTED LAPAROSCOPIC COMPLETE CYSTECT ILEAL CONDUIT;  Surgeon: Alexis Frock, MD;  [facility];  Service: Urology;  Laterality: N/A;  6.5 HRS   ROBOTIC ASSISTED LAPAROSCOPIC HYSTERECTOMY AND SALPINGECTOMY Bilateral 03/04/2021   Procedure: XI ROBOTIC ASSISTED LAPAROSCOPIC HYSTERECTOMY AND SALPINGECTOMY;  Surgeon: Alexis Frock, MD;  [facility];  Service: Urology;  Laterality: Bilateral;   TUBAL LIGATION  ~ 1988   Patient Active Problem List   Diagnosis Date Noted   Port-A-Cath in place 01/14/2021   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  SUBJECTIVE:                                                                                                                                                                                                         SUBJECTIVE STATEMENT: The patient presents to the clinic with ongoing left-sided neck pain that radiates into her left shoulder region.  Her pain at rest today is a 3/10 but can rise to higher levels when attempting to turn her neck.  She has not found anything really decrease her pain.  Her pain is described as an ache and sore.    PERTINENT HISTORY:  Reverse left shoulder.  PAIN:  Are you having pain? As above.  PRECAUTIONS: Other: No ultrasound over left shoulder.  WEIGHT BEARING RESTRICTIONS  Avoid left UE weight bearing.  FALLS:  Has patient fallen in last 6 months? No  LIVING ENVIRONMENT: Lives in: House/apartment Has following equipment at home: None  OCCUPATION: Retired.  PLOF: Independent  PATIENT GOALS Turn head better with less pain.  OBJECTIVE:   PATIENT SURVEYS:  FOTO Complete.  POSTURE: rounded shoulders and forward head  PALPATION: Very tender to palpation over left mid to lower cervical and a palpable trigger point over her left UT.   CERVICAL ROM:  Active left cervical rotation is 50 degrees and right is 62 degrees. MMT  Normal left elbow strength.  CERVICAL SPECIAL TESTS:  Diminished left Biceps reflex compared to right.      TODAY'S TREATMENT:  HMP and pre-mod e'stim (5 sec on and 5 sec off) x 15 minutes to patient's left cervical region.     CLINICAL IMPRESSION: The patient presents to [facility] with c/o chronic left-sided neck pain.  She was found to have a diminished left Biceps reflex compared to the right.  Her active bilateral cervical rotation is very limited especially to the left.  She is tender to palpation over her left mid to lower cervical region with an active trigger point in her left UT.  Her left elbow  strength is normal.  Patient will benefit from skilled physical therapy intervention to address pain and deficits.  OBJECTIVE IMPAIRMENTS decreased ROM, increased muscle spasms, postural dysfunction, and pain.   PARTICIPATION LIMITATIONS: driving  PERSONAL FACTORS 1 comorbidity: Left reveres total shoulder replacement  are also affecting patient's functional outcome.   REHAB POTENTIAL: Excellent  CLINICAL DECISION MAKING: Stable/uncomplicated  EVALUATION COMPLEXITY: Low   GOALS:  LONG TERM GOALS: Target date: 07/14/2022  Ind with an HEP. Goal status: INITIAL  2.  Increase active cervical rotation to 70 degrees+ so patient can turn head more easily while driving. Goal status: INITIAL  3.  Perform ADL's with pain not > 3/10. Goal status: INITIAL   PLAN: PT FREQUENCY:  2-3 times a week for 4 weeks.  PT DURATION: 4 weeks  PLANNED INTERVENTIONS: Therapeutic exercises, Therapeutic activity, Patient/Family education, Self Care, Dry Needling, Electrical stimulation, Moist heat, Traction, Ultrasound, and Manual therapy  PLAN FOR NEXT SESSION: Combo e'stim/US (left cervical region), STW/M, chin tucks and extension, postural exercises, can try intermittent cervical traction beginning at 13#.    Charity Tessier, [place], PT 06/16/2022, 9:53 AM

## 2022-06-16 NOTE — Addendum Note (Signed)
Addended by: Blaise Palladino, [place] W on: 06/16/2022 11:16 AM   Modules accepted: Orders

## 2022-06-18 ENCOUNTER — Ambulatory Visit: Payer: Medicare Other | Admitting: *Deleted

## 2022-06-18 DIAGNOSIS — R293 Abnormal posture: Secondary | ICD-10-CM

## 2022-06-18 DIAGNOSIS — M542 Cervicalgia: Secondary | ICD-10-CM | POA: Diagnosis not present

## 2022-06-18 NOTE — Therapy (Signed)
OUTPATIENT PHYSICAL THERAPY CERVICAL EVALUATION   Patient Name: Sandra Mann MRN: [id number] DOB:Sep 06, 1951, 71 y.o., female Today's Date: 06/18/2022   PT End of Session - 06/18/22 1153     Visit Number 2    Number of Visits 12    Date for PT Re-Evaluation 07/14/22    Authorization Type FOTO AT LEAST EVERY 5TH VISIT.  PROGRESS NOTE AT 10TH VISIT.  KX MODIFIER AFTER 15 VISITS.    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   Wears glasses    Past Surgical History:  Procedure Laterality Date   COLONOSCOPY     CYSTOSCOPY WITH INJECTION N/A 03/04/2021   Procedure: CYSTOSCOPY WITH INJECTION OF INDOCYANINE GREEN DYE;  Surgeon: Alexis Frock, MD;  [facility];  Service: Urology;  Laterality: N/A;   CYSTOSCOPY WITH URETHRAL DILATATION N/A 09/23/2020   Procedure: CYSTOSCOPY WITH URETHRAL DILATATION, EXAM UNDER ANESTHESIA, BIOPSY OF URETHRAL MASS;  Surgeon: Robley Fries, MD;  [facility];  Service: Urology;  Laterality: N/A;   EXCISION ORAL TUMOR Left 03/27/2014   Procedure: EXCISION OF ORAL CANCER;  Surgeon: Izora Gala, MD;  Location: [place];  Service: ENT;  Laterality: Left;   FINGER GANGLION CYST EXCISION Right 2007   small dip   INCISION AND DRAINAGE ABSCESS Left 06/12/2014   Procedure: LEFT INCISION AND DRAINAGE MANDIBULAR ABSCESS;  Surgeon: Izora Gala, MD;  Location: [place];  Service: ENT;  Laterality: Left;   INCISION AND DRAINAGE ABSCESS Left 11/22/2014   Procedure: INCISION AND DRAINAGE LEFT  MANDIBULAR ABSCESS;  Surgeon: Izora Gala, MD;   Location: [place];  Service: ENT;  Laterality: Left;   INCISION AND DRAINAGE OF PERITONSILLAR ABCESS Left 06/01/2014   Procedure: INCISION AND DRAINAGE Masticator Space Infection;  Surgeon: Izora Gala, MD;  Location: [place];  Service: ENT;  Laterality: Left;   IR IMAGING GUIDED PORT INSERTION  10/21/2020   LYMPH NODE DISSECTION Bilateral 03/04/2021   Procedure: LYMPH NODE DISSECTION;  Surgeon: Alexis Frock, MD;  [facility];  Service: Urology;  Laterality: Bilateral;   MULTIPLE EXTRACTIONS WITH ALVEOLOPLASTY Left 03/27/2014   Procedure: EXTRACTIONS OF NECESSARY TOOTH ;  Surgeon: Ceasar Mons, DDS;  Location: [place];  Service: Oral Surgery;  Laterality: Left;   REVERSE SHOULDER ARTHROPLASTY Left 09/03/2021   Procedure: REVERSE SHOULDER ARTHROPLASTY;  Surgeon: Justice Britain, MD;  [facility];  Service: Orthopedics;  Laterality: Left;   ROBOT ASSISTED LAPAROSCOPIC COMPLETE CYSTECT ILEAL CONDUIT N/A 03/04/2021   Procedure: XI ROBOTIC ASSISTED LAPAROSCOPIC COMPLETE CYSTECT ILEAL CONDUIT;  Surgeon: Alexis Frock, MD;  [facility];  Service: Urology;  Laterality: N/A;  6.5 HRS   ROBOTIC ASSISTED LAPAROSCOPIC HYSTERECTOMY AND SALPINGECTOMY Bilateral 03/04/2021   Procedure: XI ROBOTIC ASSISTED LAPAROSCOPIC HYSTERECTOMY AND SALPINGECTOMY;  Surgeon: Alexis Frock, MD;  [facility];  Service: Urology;  Laterality: Bilateral;   TUBAL LIGATION  ~ 1988  Patient Active Problem List   Diagnosis Date Noted   Port-A-Cath in place 01/14/2021   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  SUBJECTIVE:                                                                                                                                                                                                          SUBJECTIVE STATEMENT:    Pt reports LT side neck pain today 6-7/10 PERTINENT HISTORY:  Reverse left shoulder.  PAIN:  Are you having pain? 6-7/10  PRECAUTIONS: Other: No ultrasound over left shoulder.  WEIGHT BEARING RESTRICTIONS  Avoid left UE weight bearing.  FALLS:  Has patient fallen in last 6 months? No  LIVING ENVIRONMENT: Lives in: House/apartment Has following equipment at home: None  OCCUPATION: Retired.  PLOF: Independent  PATIENT GOALS Turn head better with less pain.  OBJECTIVE:   PATIENT SURVEYS:  FOTO Complete.  POSTURE: rounded shoulders and forward head  PALPATION: Very tender to palpation over left mid to lower cervical and a palpable trigger point over her left UT.   CERVICAL ROM:  Active left cervical rotation is 50 degrees and right is 62 degrees. MMT  Normal left elbow strength.  CERVICAL SPECIAL TESTS:  Diminished left Biceps reflex compared to right.      TODAY'S TREATMENT: 06-18-22         US/estim Combo x10 mins 1.5 w/cm2 to LT cervical paras and levator          Manual STW/TPR to Cerv. Paras, UT, and levator scap           Therex: Chin tucks 2x10 hold 3 secs. HEP handout given. Good demonstration from patient.             Also discussed postures to decrease neck tension/pain.  HMP and pre-mod e'stim (5 sec on and 5 sec off) x 15 minutes to patient's left cervical region.     CLINICAL IMPRESSION: Pt arrived today doing fair, but with LT side cervical pain. Rx focused on pain relief, posture awareness and therex for posture awareness. Handout given to Pt for for chin tucks for HEP.   OBJECTIVE IMPAIRMENTS decreased ROM, increased muscle spasms, postural dysfunction, and pain.   PARTICIPATION LIMITATIONS: driving  PERSONAL FACTORS 1 comorbidity: Left reveres total  shoulder replacement  are also affecting patient's functional  outcome.   REHAB POTENTIAL: Excellent  CLINICAL DECISION MAKING: Stable/uncomplicated  EVALUATION COMPLEXITY: Low   GOALS:  LONG TERM GOALS: Target date: 07/16/2022  Ind with an HEP. Goal status: INITIAL  2.  Increase active cervical rotation to 70 degrees+ so patient can turn head more easily while driving. Goal status: INITIAL  3.  Perform ADL's with pain not > 3/10. Goal status: INITIAL   PLAN: PT FREQUENCY:  2-3 times a week for 4 weeks.  PT DURATION: 4 weeks  PLANNED INTERVENTIONS: Therapeutic exercises, Therapeutic activity, Patient/Family education, Self Care, Dry Needling, Electrical stimulation, Moist heat, Traction, Ultrasound, and Manual therapy  PLAN FOR NEXT SESSION: Combo e'stim/US (left cervical region), STW/M, chin tucks and extension, postural exercises, can try intermittent cervical traction beginning at 13#.    Aislynn Cifelli,CHRIS, PTA 06/18/2022, 12:24 PM

## 2022-06-22 ENCOUNTER — Ambulatory Visit: Payer: Medicare Other

## 2022-06-22 DIAGNOSIS — M542 Cervicalgia: Secondary | ICD-10-CM

## 2022-06-22 DIAGNOSIS — R293 Abnormal posture: Secondary | ICD-10-CM

## 2022-06-22 NOTE — Therapy (Signed)
OUTPATIENT PHYSICAL THERAPY CERVICAL EVALUATION   Patient Name: Sandra Mann MRN: [id number] DOB:25-Apr-1951, 71 y.o., female Today's Date: 06/22/2022   PT End of Session - 06/22/22 0904     Visit Number 3    Number of Visits 12    Date for PT Re-Evaluation 07/14/22    Authorization Type FOTO AT LEAST EVERY 5TH VISIT.  PROGRESS NOTE AT 10TH VISIT.  KX MODIFIER AFTER 15 VISITS.    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   Wears glasses    Past Surgical History:  Procedure Laterality Date   COLONOSCOPY     CYSTOSCOPY WITH INJECTION N/A 03/04/2021   Procedure: CYSTOSCOPY WITH INJECTION OF INDOCYANINE GREEN DYE;  Surgeon: Alexis Frock, MD;  [facility];  Service: Urology;  Laterality: N/A;   CYSTOSCOPY WITH URETHRAL DILATATION N/A 09/23/2020   Procedure: CYSTOSCOPY WITH URETHRAL DILATATION, EXAM UNDER ANESTHESIA, BIOPSY OF URETHRAL MASS;  Surgeon: Robley Fries, MD;  [facility];  Service: Urology;  Laterality: N/A;   EXCISION ORAL TUMOR Left 03/27/2014   Procedure: EXCISION OF ORAL CANCER;  Surgeon: Izora Gala, MD;  Location: [place];  Service: ENT;  Laterality: Left;   FINGER GANGLION CYST EXCISION Right 2007   small dip   INCISION AND DRAINAGE ABSCESS Left 06/12/2014   Procedure: LEFT INCISION AND DRAINAGE MANDIBULAR ABSCESS;  Surgeon: Izora Gala, MD;  Location: [place];  Service: ENT;  Laterality: Left;   INCISION AND DRAINAGE ABSCESS Left 11/22/2014   Procedure: INCISION AND DRAINAGE LEFT  MANDIBULAR ABSCESS;  Surgeon: Izora Gala, MD;   Location: [place];  Service: ENT;  Laterality: Left;   INCISION AND DRAINAGE OF PERITONSILLAR ABCESS Left 06/01/2014   Procedure: INCISION AND DRAINAGE Masticator Space Infection;  Surgeon: Izora Gala, MD;  Location: [place];  Service: ENT;  Laterality: Left;   IR IMAGING GUIDED PORT INSERTION  10/21/2020   LYMPH NODE DISSECTION Bilateral 03/04/2021   Procedure: LYMPH NODE DISSECTION;  Surgeon: Alexis Frock, MD;  [facility];  Service: Urology;  Laterality: Bilateral;   MULTIPLE EXTRACTIONS WITH ALVEOLOPLASTY Left 03/27/2014   Procedure: EXTRACTIONS OF NECESSARY TOOTH ;  Surgeon: Ceasar Mons, DDS;  Location: [place];  Service: Oral Surgery;  Laterality: Left;   REVERSE SHOULDER ARTHROPLASTY Left 09/03/2021   Procedure: REVERSE SHOULDER ARTHROPLASTY;  Surgeon: Justice Britain, MD;  [facility];  Service: Orthopedics;  Laterality: Left;   ROBOT ASSISTED LAPAROSCOPIC COMPLETE CYSTECT ILEAL CONDUIT N/A 03/04/2021   Procedure: XI ROBOTIC ASSISTED LAPAROSCOPIC COMPLETE CYSTECT ILEAL CONDUIT;  Surgeon: Alexis Frock, MD;  [facility];  Service: Urology;  Laterality: N/A;  6.5 HRS   ROBOTIC ASSISTED LAPAROSCOPIC HYSTERECTOMY AND SALPINGECTOMY Bilateral 03/04/2021   Procedure: XI ROBOTIC ASSISTED LAPAROSCOPIC HYSTERECTOMY AND SALPINGECTOMY;  Surgeon: Alexis Frock, MD;  [facility];  Service: Urology;  Laterality: Bilateral;   TUBAL LIGATION  ~ 1988  Patient Active Problem List   Diagnosis Date Noted   Port-A-Cath in place 01/14/2021   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  SUBJECTIVE:                                                                                                                                                                                                          SUBJECTIVE STATEMENT:    Pt reports LT side neck pain today 3/10  PERTINENT HISTORY:  Reverse left shoulder.  PAIN:  Are you having pain? 3/10  PRECAUTIONS: Other: No ultrasound over left shoulder.  WEIGHT BEARING RESTRICTIONS  Avoid left UE weight bearing.  FALLS:  Has patient fallen in last 6 months? No  LIVING ENVIRONMENT: Lives in: House/apartment Has following equipment at home: None  OCCUPATION: Retired.  PLOF: Independent  PATIENT GOALS Turn head better with less pain.  OBJECTIVE:   PATIENT SURVEYS:  FOTO Complete.  POSTURE: rounded shoulders and forward head  PALPATION: Very tender to palpation over left mid to lower cervical and a palpable trigger point over her left UT.   CERVICAL ROM:  Active left cervical rotation is 50 degrees and right is 62 degrees. MMT  Normal left elbow strength.  CERVICAL SPECIAL TESTS:  Diminished left Biceps reflex compared to right.      TODAY'S TREATMENT:                                 8/22        EXERCISE LOG  Exercise Repetitions and Resistance Comments  Lateral Flexion 10 reps w 3 sec hold   Chin Tucks 10 reps w 3 sec hold    Cervical Rotation 10 reps w 3 sec hold            Blank cell = exercise not performed today      Manual Therapy Soft Tissue Mobilization: left cervical paraspinals and upper trap, STW/M  Manual Traction: Cervical, manual traction to cervical spine   Modalities  Date:  Unattended Estim: Cervical, pre-mod 80-105 Hz, 15 mins, Pain and Tone Hot Pack: Cervical, 15 mins, Pain and Tone      CLINICAL IMPRESSION: Pt arrives for today's treatment session reporting 3/10 left neck and shoulder  pain.  Pt introduced to cervical lateral flexion and cervical rotation with hold to decrease pain and tone.  Reviewed chin tucks with min cues for proper technique required.  STW/M performed to left  cervical paraspinals and left upper trap to decrease pain and tone.  Manual cervical traction performed to decrease pain and tone with progression to mechanical traction at next session.  Normal responses to estim and Mh noted upon removal.  Pt reported decrease in neck pain at completion of today's treatment session.    OBJECTIVE IMPAIRMENTS decreased ROM, increased muscle spasms, postural dysfunction, and pain.   PARTICIPATION LIMITATIONS: driving  PERSONAL FACTORS 1 comorbidity: Left reveres total shoulder replacement  are also affecting patient's functional outcome.   REHAB POTENTIAL: Excellent  CLINICAL DECISION MAKING: Stable/uncomplicated  EVALUATION COMPLEXITY: Low   GOALS:  LONG TERM GOALS: Target date: 07/20/2022  Ind with an HEP. Goal status: INITIAL  2.  Increase active cervical rotation to 70 degrees+ so patient can turn head more easily while driving. Goal status: INITIAL  3.  Perform ADL's with pain not > 3/10. Goal status: INITIAL   PLAN: PT FREQUENCY:  2-3 times a week for 4 weeks.  PT DURATION: 4 weeks  PLANNED INTERVENTIONS: Therapeutic exercises, Therapeutic activity, Patient/Family education, Self Care, Dry Needling, Electrical stimulation, Moist heat, Traction, Ultrasound, and Manual therapy  PLAN FOR NEXT SESSION: Combo e'stim/US (left cervical region), STW/M, chin tucks and extension, postural exercises, can try intermittent cervical traction beginning at 13#.    Kathrynn Ducking, PTA 06/22/2022, 9:56 AM

## 2022-06-23 ENCOUNTER — Encounter: Payer: Self-pay | Admitting: *Deleted

## 2022-06-23 ENCOUNTER — Ambulatory Visit: Payer: Medicare Other | Admitting: *Deleted

## 2022-06-23 DIAGNOSIS — M542 Cervicalgia: Secondary | ICD-10-CM | POA: Diagnosis not present

## 2022-06-23 DIAGNOSIS — M25612 Stiffness of left shoulder, not elsewhere classified: Secondary | ICD-10-CM

## 2022-06-23 DIAGNOSIS — R293 Abnormal posture: Secondary | ICD-10-CM

## 2022-06-23 DIAGNOSIS — M25512 Pain in left shoulder: Secondary | ICD-10-CM

## 2022-06-23 NOTE — Therapy (Signed)
OUTPATIENT PHYSICAL THERAPY CERVICAL TREATMENT   Patient Name: Sandra Mann MRN: [id number] DOB:1951-01-05, 71 y.o., female Today's Date: 06/23/2022   PT End of Session - 06/23/22 0904     Visit Number 4    Number of Visits 12    Date for PT Re-Evaluation 07/14/22    Authorization Type FOTO AT LEAST EVERY 5TH VISIT.  PROGRESS NOTE AT 10TH VISIT.  KX MODIFIER AFTER 15 VISITS.    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   Wears glasses    Past Surgical History:  Procedure Laterality Date   COLONOSCOPY     CYSTOSCOPY WITH INJECTION N/A 03/04/2021   Procedure: CYSTOSCOPY WITH INJECTION OF INDOCYANINE GREEN DYE;  Surgeon: Alexis Frock, MD;  [facility];  Service: Urology;  Laterality: N/A;   CYSTOSCOPY WITH URETHRAL DILATATION N/A 09/23/2020   Procedure: CYSTOSCOPY WITH URETHRAL DILATATION, EXAM UNDER ANESTHESIA, BIOPSY OF URETHRAL MASS;  Surgeon: Robley Fries, MD;  [facility];  Service: Urology;  Laterality: N/A;   EXCISION ORAL TUMOR Left 03/27/2014   Procedure: EXCISION OF ORAL CANCER;  Surgeon: Izora Gala, MD;  Location: [place];  Service: ENT;  Laterality: Left;   FINGER GANGLION CYST EXCISION Right 2007   small dip   INCISION AND DRAINAGE ABSCESS Left 06/12/2014   Procedure: LEFT INCISION AND DRAINAGE MANDIBULAR ABSCESS;  Surgeon: Izora Gala, MD;  Location: [place];  Service: ENT;  Laterality: Left;   INCISION AND DRAINAGE ABSCESS Left 11/22/2014   Procedure: INCISION AND DRAINAGE LEFT  MANDIBULAR ABSCESS;  Surgeon: Izora Gala, MD;   Location: [place];  Service: ENT;  Laterality: Left;   INCISION AND DRAINAGE OF PERITONSILLAR ABCESS Left 06/01/2014   Procedure: INCISION AND DRAINAGE Masticator Space Infection;  Surgeon: Izora Gala, MD;  Location: [place];  Service: ENT;  Laterality: Left;   IR IMAGING GUIDED PORT INSERTION  10/21/2020   LYMPH NODE DISSECTION Bilateral 03/04/2021   Procedure: LYMPH NODE DISSECTION;  Surgeon: Alexis Frock, MD;  [facility];  Service: Urology;  Laterality: Bilateral;   MULTIPLE EXTRACTIONS WITH ALVEOLOPLASTY Left 03/27/2014   Procedure: EXTRACTIONS OF NECESSARY TOOTH ;  Surgeon: Ceasar Mons, DDS;  Location: [place];  Service: Oral Surgery;  Laterality: Left;   REVERSE SHOULDER ARTHROPLASTY Left 09/03/2021   Procedure: REVERSE SHOULDER ARTHROPLASTY;  Surgeon: Justice Britain, MD;  [facility];  Service: Orthopedics;  Laterality: Left;   ROBOT ASSISTED LAPAROSCOPIC COMPLETE CYSTECT ILEAL CONDUIT N/A 03/04/2021   Procedure: XI ROBOTIC ASSISTED LAPAROSCOPIC COMPLETE CYSTECT ILEAL CONDUIT;  Surgeon: Alexis Frock, MD;  [facility];  Service: Urology;  Laterality: N/A;  6.5 HRS   ROBOTIC ASSISTED LAPAROSCOPIC HYSTERECTOMY AND SALPINGECTOMY Bilateral 03/04/2021   Procedure: XI ROBOTIC ASSISTED LAPAROSCOPIC HYSTERECTOMY AND SALPINGECTOMY;  Surgeon: Alexis Frock, MD;  [facility];  Service: Urology;  Laterality: Bilateral;   TUBAL LIGATION  ~ 1988  Patient Active Problem List   Diagnosis Date Noted   Port-A-Cath in place 01/14/2021   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  SUBJECTIVE:                                                                                                                                                                                                          SUBJECTIVE STATEMENT:    Pt reports LT side neck pain today 3-4/10  PERTINENT HISTORY:  Reverse left shoulder.  PAIN:  Are you having pain? 3/10  PRECAUTIONS: Other: No ultrasound over left shoulder.  WEIGHT BEARING RESTRICTIONS  Avoid left UE weight bearing.  FALLS:  Has patient fallen in last 6 months? No  LIVING ENVIRONMENT: Lives in: House/apartment Has following equipment at home: None  OCCUPATION: Retired.  PLOF: Independent  PATIENT GOALS Turn head better with less pain.  OBJECTIVE:   PATIENT SURVEYS:  FOTO Complete.  POSTURE: rounded shoulders and forward head  PALPATION: Very tender to palpation over left mid to lower cervical and a palpable trigger point over her left UT.   CERVICAL ROM:  Active left cervical rotation is 50 degrees and right is 62 degrees. MMT  Normal left elbow strength.  CERVICAL SPECIAL TESTS:  Diminished left Biceps reflex compared to right.      TODAY'S TREATMENT:                                 8/22        EXERCISE LOG  Exercise Repetitions and Resistance Comments  Lateral Flexion Isometrics x 5 hold 5 secs Bil   Chin Tucks 10 reps w 5 sec hold    Cervical Rotation 10 reps w 3 sec hold   Flexion Isometrics With fist under chin x 5 and hold 5 secs        Blank cell = exercise not performed today      Manual Therapy Manual cerv ext isometrics x 5 hold 5 secs Soft Tissue Mobilization: left cervical paraspinals and upper trap and Levator scap, STW/M and TPR  Manual Traction:  ,   Modalities Mechanical  cervical traction x 10  mins , max 14#s, min 5 #s  99 sec hold/ 5sec hold Date:  Unattended Estim: Cervical, pre-mod 80-105 Hz, 10 mins, Pain and Tone Hot Pack: Cervical, 10 mins, Pain and Tone      CLINICAL IMPRESSION: Pt arrived  today doing fairly well and was able to perform cervical isometrics and ROM and did well. STW/ TPR were also performed to cerv. Paras and UT and levator with good release. Cerv. Traction performed at 14#'s and tolerated well. Assess next x.  OBJECTIVE IMPAIRMENTS decreased ROM, increased muscle spasms, postural dysfunction, and pain.   PARTICIPATION LIMITATIONS: driving  PERSONAL FACTORS 1 comorbidity: Left reveres total shoulder replacement  are also affecting patient's functional outcome.   REHAB POTENTIAL: Excellent  CLINICAL DECISION MAKING: Stable/uncomplicated  EVALUATION COMPLEXITY: Low   GOALS:  LONG TERM GOALS: Target date: 07/21/2022  Ind with an HEP. Goal status: INITIAL  2.  Increase active cervical rotation to 70 degrees+ so patient can turn head more easily while driving. Goal status: INITIAL  3.  Perform ADL's with pain not > 3/10. Goal status: INITIAL   PLAN: PT FREQUENCY:  2-3 times a week for 4 weeks.  PT DURATION: 4 weeks  PLANNED INTERVENTIONS: Therapeutic exercises, Therapeutic activity, Patient/Family education, Self Care, Dry Needling, Electrical stimulation, Moist heat, Traction, Ultrasound, and Manual therapy  PLAN FOR NEXT SESSION: Combo e'stim/US (left cervical region), STW/M, chin tucks and extension, postural exercises, can try intermittent cervical traction beginning at 13#.    Jalaysha Skilton,CHRIS, PTA 06/23/2022, 10:02 AM

## 2022-06-28 ENCOUNTER — Ambulatory Visit: Payer: Medicare Other | Admitting: Physical Therapy

## 2022-06-28 ENCOUNTER — Encounter: Payer: Self-pay | Admitting: Physical Therapy

## 2022-06-28 DIAGNOSIS — M542 Cervicalgia: Secondary | ICD-10-CM

## 2022-06-28 DIAGNOSIS — R293 Abnormal posture: Secondary | ICD-10-CM

## 2022-06-28 NOTE — Therapy (Signed)
OUTPATIENT PHYSICAL THERAPY CERVICAL TREATMENT   Patient Name: Sandra Mann MRN: [id number] DOB:04-Mar-1951, 71 y.o., female Today's Date: 06/28/2022   PT End of Session - 06/28/22 0946     Visit Number 5    Number of Visits 12    Date for PT Re-Evaluation 07/14/22    Authorization Type FOTO AT LEAST EVERY 5TH VISIT.  PROGRESS NOTE AT 10TH VISIT.  KX MODIFIER AFTER 15 VISITS.    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   Wears glasses    Past Surgical History:  Procedure Laterality Date   COLONOSCOPY     CYSTOSCOPY WITH INJECTION N/A 03/04/2021   Procedure: CYSTOSCOPY WITH INJECTION OF INDOCYANINE GREEN DYE;  Surgeon: Alexis Frock, MD;  [facility];  Service: Urology;  Laterality: N/A;   CYSTOSCOPY WITH URETHRAL DILATATION N/A 09/23/2020   Procedure: CYSTOSCOPY WITH URETHRAL DILATATION, EXAM UNDER ANESTHESIA, BIOPSY OF URETHRAL MASS;  Surgeon: Robley Fries, MD;  [facility];  Service: Urology;  Laterality: N/A;   EXCISION ORAL TUMOR Left 03/27/2014   Procedure: EXCISION OF ORAL CANCER;  Surgeon: Izora Gala, MD;  Location: [place];  Service: ENT;  Laterality: Left;   FINGER GANGLION CYST EXCISION Right 2007   small dip   INCISION AND DRAINAGE ABSCESS Left 06/12/2014   Procedure: LEFT INCISION AND DRAINAGE MANDIBULAR ABSCESS;  Surgeon: Izora Gala, MD;  Location: [place];  Service: ENT;  Laterality: Left;   INCISION AND  DRAINAGE ABSCESS Left 11/22/2014   Procedure: INCISION AND DRAINAGE LEFT  MANDIBULAR ABSCESS;  Surgeon: Izora Gala, MD;  Location: [place];  Service: ENT;  Laterality: Left;   INCISION AND DRAINAGE OF PERITONSILLAR ABCESS Left 06/01/2014   Procedure: INCISION AND DRAINAGE Masticator Space Infection;  Surgeon: Izora Gala, MD;  Location: [place];  Service: ENT;  Laterality: Left;   IR IMAGING GUIDED PORT INSERTION  10/21/2020   LYMPH NODE DISSECTION Bilateral 03/04/2021   Procedure: LYMPH NODE DISSECTION;  Surgeon: Alexis Frock, MD;  [facility];  Service: Urology;  Laterality: Bilateral;   MULTIPLE EXTRACTIONS WITH ALVEOLOPLASTY Left 03/27/2014   Procedure: EXTRACTIONS OF NECESSARY TOOTH ;  Surgeon: Ceasar Mons, DDS;  Location: [place];  Service: Oral Surgery;  Laterality: Left;   REVERSE SHOULDER ARTHROPLASTY Left 09/03/2021   Procedure: REVERSE SHOULDER ARTHROPLASTY;  Surgeon: Justice Britain, MD;  [facility];  Service: Orthopedics;  Laterality: Left;   ROBOT ASSISTED LAPAROSCOPIC COMPLETE CYSTECT ILEAL CONDUIT N/A 03/04/2021   Procedure: XI ROBOTIC ASSISTED LAPAROSCOPIC COMPLETE CYSTECT ILEAL CONDUIT;  Surgeon: Alexis Frock, MD;  [facility];  Service: Urology;  Laterality: N/A;  6.5 HRS   ROBOTIC ASSISTED LAPAROSCOPIC HYSTERECTOMY AND SALPINGECTOMY Bilateral 03/04/2021   Procedure: XI ROBOTIC ASSISTED LAPAROSCOPIC HYSTERECTOMY AND SALPINGECTOMY;  Surgeon: Alexis Frock, MD;  [facility];  Service: Urology;  Laterality: Bilateral;   TUBAL LIGATION  ~ 1988   Patient Active Problem List   Diagnosis Date Noted   Port-A-Cath in place 01/14/2021   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  SUBJECTIVE:                                                                                                                                                                                                         SUBJECTIVE STATEMENT:    Reports palpable tenderness to L UT region but a lot of pain with any movement to the LUE. Patient limited with any use of the LUE due to pain.  PERTINENT HISTORY:  Reverse left shoulder.  PAIN:  Are you having pain? 5-6/10 sharp LUE with movement  PRECAUTIONS: Other: No ultrasound over left shoulder.  WEIGHT BEARING RESTRICTIONS  Avoid left UE weight bearing.  FALLS:  Has patient fallen in last 6 months? No  LIVING ENVIRONMENT: Lives in: House/apartment Has following equipment at home: None  OCCUPATION: Retired.  PLOF: Independent  PATIENT GOALS Turn head better with less pain.  OBJECTIVE:   PATIENT SURVEYS:  FOTO Complete.  POSTURE: rounded shoulders and forward head  PALPATION: Very tender to palpation over left mid to lower cervical and a palpable trigger point over her left UT.   CERVICAL ROM:  Active left cervical rotation is 50 degrees and right is 62 degrees.  MMT Normal left elbow strength.  CERVICAL SPECIAL TESTS:  Diminished left Biceps reflex compared to right.  TODAY'S TREATMENT:                                 8/28    Manual Therapy Soft Tissue Mobilization: L UT, cervical paraspinals, gentle STW attempted but not tolerable    Modalities  Date: 06/28/2022 Traction: cervical, max 14# min 5#, 15 mins mins Unattended Estim: Cervical, Pre-Mod, 15 mins, Pain and Tone Hot Pack: Cervical, 15 mins, Pain and Tone   CLINICAL IMPRESSION: Patient presented in clinic with increased LUE pain with movement. Patient had been doing well but went to her [place] house over the weekend and had to clean extensively and was stressed. Patient limited with treatment today due to  pain and intermittant sharp pains with  movement. Light and gentle STW attempted to L cervical paraspinals and UT region but not tolerable to patient. Normal response to all modalities today. No increase in max mechanical weight for traction due to pain.  OBJECTIVE IMPAIRMENTS decreased ROM, increased muscle spasms, postural dysfunction, and pain.   PARTICIPATION LIMITATIONS: driving  PERSONAL FACTORS 1 comorbidity: Left reveres total shoulder replacement  are also affecting patient's functional outcome.   REHAB POTENTIAL: Excellent  CLINICAL DECISION MAKING: Stable/uncomplicated  EVALUATION COMPLEXITY: Low   GOALS:  LONG TERM GOALS: Target date: 07/26/2022  Ind with an HEP. Goal status: INITIAL  2.  Increase active cervical rotation to 70 degrees+ so patient can turn head more easily while driving. Goal status: INITIAL  3.  Perform ADL's with pain not > 3/10. Goal status: INITIAL   PLAN: PT FREQUENCY:  2-3 times a week for 4 weeks.  PT DURATION: 4 weeks  PLANNED INTERVENTIONS: Therapeutic exercises, Therapeutic activity, Patient/Family education, Self Care, Dry Needling, Electrical stimulation, Moist heat, Traction, Ultrasound, and Manual therapy  PLAN FOR NEXT SESSION: Combo e'stim/US (left cervical region), STW/M, chin tucks and extension, postural exercises, can try intermittent cervical traction beginning at 13#.    Standley Brooking, PTA 06/28/2022, 10:42 AM

## 2022-06-30 ENCOUNTER — Encounter: Payer: Self-pay | Admitting: Physical Therapy

## 2022-06-30 ENCOUNTER — Ambulatory Visit: Payer: Medicare Other | Admitting: Physical Therapy

## 2022-06-30 DIAGNOSIS — R293 Abnormal posture: Secondary | ICD-10-CM

## 2022-06-30 DIAGNOSIS — M542 Cervicalgia: Secondary | ICD-10-CM

## 2022-06-30 NOTE — Therapy (Signed)
OUTPATIENT PHYSICAL THERAPY CERVICAL TREATMENT   Patient Name: Sandra Mann MRN: [id number] DOB:[birth year], 71 y.o., female Today's Date: 06/30/2022   PT End of Session - 06/30/22 0901     Visit Number 6    Number of Visits 12    Date for PT Re-Evaluation 07/14/22    Authorization Type FOTO AT LEAST EVERY 5TH VISIT.  PROGRESS NOTE AT 10TH VISIT.  KX MODIFIER AFTER 15 VISITS.    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   Wears glasses    Past Surgical History:  Procedure Laterality Date   COLONOSCOPY     CYSTOSCOPY WITH INJECTION N/A 03/04/2021   Procedure: CYSTOSCOPY WITH INJECTION OF INDOCYANINE GREEN DYE;  Surgeon: Alexis Frock, MD;  [facility];  Service: Urology;  Laterality: N/A;   CYSTOSCOPY WITH URETHRAL DILATATION N/A 09/23/2020   Procedure: CYSTOSCOPY WITH URETHRAL DILATATION, EXAM UNDER ANESTHESIA, BIOPSY OF URETHRAL MASS;  Surgeon: Robley Fries, MD;  [facility];  Service: Urology;  Laterality: N/A;   EXCISION ORAL TUMOR Left 03/27/2014   Procedure: EXCISION OF ORAL CANCER;  Surgeon: Izora Gala, MD;  Location: [place];  Service: ENT;  Laterality: Left;   FINGER GANGLION CYST EXCISION Right 2007   small dip   INCISION AND DRAINAGE ABSCESS Left 06/12/2014   Procedure: LEFT INCISION AND DRAINAGE MANDIBULAR ABSCESS;  Surgeon: Izora Gala, MD;  Location: [place];  Service: ENT;  Laterality: Left;   INCISION AND  DRAINAGE ABSCESS Left 11/22/2014   Procedure: INCISION AND DRAINAGE LEFT  MANDIBULAR ABSCESS;  Surgeon: Izora Gala, MD;  Location: [place];  Service: ENT;  Laterality: Left;   INCISION AND DRAINAGE OF PERITONSILLAR ABCESS Left 06/01/2014   Procedure: INCISION AND DRAINAGE Masticator Space Infection;  Surgeon: Izora Gala, MD;  Location: [place];  Service: ENT;  Laterality: Left;   IR IMAGING GUIDED PORT INSERTION  10/21/2020   LYMPH NODE DISSECTION Bilateral 03/04/2021   Procedure: LYMPH NODE DISSECTION;  Surgeon: Alexis Frock, MD;  [facility];  Service: Urology;  Laterality: Bilateral;   MULTIPLE EXTRACTIONS WITH ALVEOLOPLASTY Left 03/27/2014   Procedure: EXTRACTIONS OF NECESSARY TOOTH ;  Surgeon: Ceasar Mons, DDS;  Location: [place];  Service: Oral Surgery;  Laterality: Left;   REVERSE SHOULDER ARTHROPLASTY Left 09/03/2021   Procedure: REVERSE SHOULDER ARTHROPLASTY;  Surgeon: Justice Britain, MD;  [facility];  Service: Orthopedics;  Laterality: Left;   ROBOT ASSISTED LAPAROSCOPIC COMPLETE CYSTECT ILEAL CONDUIT N/A 03/04/2021   Procedure: XI ROBOTIC ASSISTED LAPAROSCOPIC COMPLETE CYSTECT ILEAL CONDUIT;  Surgeon: Alexis Frock, MD;  [facility];  Service: Urology;  Laterality: N/A;  6.5 HRS   ROBOTIC ASSISTED LAPAROSCOPIC HYSTERECTOMY AND SALPINGECTOMY Bilateral 03/04/2021   Procedure: XI ROBOTIC ASSISTED LAPAROSCOPIC HYSTERECTOMY AND SALPINGECTOMY;  Surgeon: Alexis Frock, MD;  [facility];  Service: Urology;  Laterality: Bilateral;   TUBAL LIGATION  ~ 1988   Patient Active Problem List   Diagnosis Date Noted   Port-A-Cath in place 01/14/2021   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  SUBJECTIVE:                                                                                                                                                                                                         SUBJECTIVE STATEMENT:    Reports greater pain yesterday but didn't do a lot of activity. Can move LUE somewhat but still having the very sharp pain down LUE. Neck symptoms are better today but still sore.  PERTINENT HISTORY:  Reverse left shoulder.  PAIN:  Are you having pain? Sitting: mild. Movement: 5-6/10  PRECAUTIONS: Other: No ultrasound over left shoulder.  WEIGHT BEARING RESTRICTIONS  Avoid left UE weight bearing.  FALLS:  Has patient fallen in last 6 months? No  LIVING ENVIRONMENT: Lives in: House/apartment Has following equipment at home: None  OCCUPATION: Retired.  PLOF: Independent  PATIENT GOALS Turn head better with less pain.  OBJECTIVE:   PATIENT SURVEYS:  FOTO Complete.  POSTURE: rounded shoulders and forward head  PALPATION: Very tender to palpation over left mid to lower cervical and a palpable trigger point over her left UT.   CERVICAL ROM:  Active left cervical rotation is 50 degrees and right is 62 degrees.  MMT Normal left elbow strength.  CERVICAL SPECIAL TESTS:  Diminished left Biceps reflex compared to right.  TODAY'S TREATMENT:  Modalities  Date: 06/30/2022 Traction: cervical, 14# max 5# min, 15 mins mins Unattended Estim: Cervical, Pre-Mod, 15 mins, Pain and Tone Combo: Cervical, 1.2 w/cm2, 100%, 10 mins, Pain and Tone Hot Pack: Cervical, 15 mins, Pain and Tone  CLINICAL IMPRESSION: Patient presented in clinic with reports of somewhat less pain today in cervical spine and intermittant LUE pain with activities such as reaching. Has to stabilize LUE if trying to dry her hair. Patient still indicating limitation with L UT stretch due to pain and tightness. Patient able to tolerate combo session without complaint of  pain. Mechanical traction as well as stim and heat utilized with normal response in order to relieve pain and muscle tightness. Patient encouraged to continue  the exercises on her current HEP as to avoid another exacerbation. Patient understanding of her HEP but has not been able to do it due to pain.  OBJECTIVE IMPAIRMENTS decreased ROM, increased muscle spasms, postural dysfunction, and pain.   PARTICIPATION LIMITATIONS: driving  PERSONAL FACTORS 1 comorbidity: Left reveres total shoulder replacement  are also affecting patient's functional outcome.   REHAB POTENTIAL: Excellent  CLINICAL DECISION MAKING: Stable/uncomplicated  EVALUATION COMPLEXITY: Low   GOALS:  LONG TERM GOALS: Target date: 07/28/2022  Ind with an HEP. Goal status: Met  2.  Increase active cervical rotation to 70 degrees+ so patient can turn head more easily while driving. Goal status: On-going  3.  Perform ADL's with pain not > 3/10. Goal status: On-going   PLAN: PT FREQUENCY:  2-3 times a week for 4 weeks.  PT DURATION: 4 weeks  PLANNED INTERVENTIONS: Therapeutic exercises, Therapeutic activity, Patient/Family education, Self Care, Dry Needling, Electrical stimulation, Moist heat, Traction, Ultrasound, and Manual therapy  PLAN FOR NEXT SESSION: Combo e'stim/US (left cervical region), STW/M, chin tucks and extension, postural exercises, can try intermittent cervical traction beginning at 13#.    Standley Brooking, PTA 06/30/2022, 9:55 AM

## 2022-07-07 ENCOUNTER — Ambulatory Visit: Payer: Medicare Other | Attending: Family Medicine | Admitting: *Deleted

## 2022-07-07 ENCOUNTER — Encounter: Payer: Self-pay | Admitting: *Deleted

## 2022-07-07 DIAGNOSIS — R293 Abnormal posture: Secondary | ICD-10-CM | POA: Insufficient documentation

## 2022-07-07 DIAGNOSIS — M542 Cervicalgia: Secondary | ICD-10-CM | POA: Diagnosis not present

## 2022-07-07 NOTE — Therapy (Signed)
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   Wears glasses    Past Surgical History:  Procedure Laterality Date   COLONOSCOPY     CYSTOSCOPY WITH INJECTION N/A 03/04/2021   Procedure: CYSTOSCOPY WITH INJECTION OF INDOCYANINE GREEN DYE;  Surgeon: Alexis Frock, MD;  [facility];  Service: Urology;  Laterality: N/A;   CYSTOSCOPY WITH URETHRAL DILATATION N/A 09/23/2020   Procedure: CYSTOSCOPY WITH URETHRAL DILATATION, EXAM UNDER ANESTHESIA, BIOPSY OF URETHRAL MASS;  Surgeon: Robley Fries, MD;  [facility];  Service: Urology;  Laterality: N/A;   EXCISION ORAL TUMOR Left 03/27/2014   Procedure: EXCISION OF ORAL CANCER;  Surgeon: Izora Gala, MD;  Location: [place];  Service: ENT;  Laterality: Left;   FINGER GANGLION CYST EXCISION Right 2007   small dip   INCISION AND DRAINAGE ABSCESS Left 06/12/2014   Procedure: LEFT INCISION AND DRAINAGE MANDIBULAR ABSCESS;  Surgeon: Izora Gala, MD;  Location: [place];  Service: ENT;  Laterality: Left;   INCISION AND DRAINAGE ABSCESS Left 11/22/2014   Procedure: INCISION AND DRAINAGE LEFT  MANDIBULAR ABSCESS;  Surgeon: Izora Gala, MD;  Location: [place];  Service: ENT;  Laterality: Left;   INCISION AND DRAINAGE OF PERITONSILLAR ABCESS Left 06/01/2014    Procedure: INCISION AND DRAINAGE Masticator Space Infection;  Surgeon: Izora Gala, MD;  Location: [place];  Service: ENT;  Laterality: Left;   IR IMAGING GUIDED PORT INSERTION  10/21/2020   LYMPH NODE DISSECTION Bilateral 03/04/2021   Procedure: LYMPH NODE DISSECTION;  Surgeon: Alexis Frock, MD;  [facility];  Service: Urology;  Laterality: Bilateral;   MULTIPLE EXTRACTIONS WITH ALVEOLOPLASTY Left 03/27/2014   Procedure: EXTRACTIONS OF NECESSARY TOOTH ;  Surgeon: Ceasar Mons, DDS;  Location: [place];  Service: Oral Surgery;  Laterality: Left;   REVERSE SHOULDER ARTHROPLASTY Left 09/03/2021   Procedure: REVERSE SHOULDER ARTHROPLASTY;  Surgeon: Justice Britain, MD;  [facility];  Service: Orthopedics;  Laterality: Left;   ROBOT ASSISTED LAPAROSCOPIC COMPLETE CYSTECT ILEAL CONDUIT N/A 03/04/2021   Procedure: XI ROBOTIC ASSISTED LAPAROSCOPIC COMPLETE CYSTECT ILEAL CONDUIT;  Surgeon: Alexis Frock, MD;  [facility];  Service: Urology;  Laterality: N/A;  6.5 HRS   ROBOTIC ASSISTED LAPAROSCOPIC HYSTERECTOMY AND SALPINGECTOMY Bilateral 03/04/2021   Procedure: XI ROBOTIC ASSISTED LAPAROSCOPIC HYSTERECTOMY AND SALPINGECTOMY;  Surgeon: Alexis Frock, MD;  [facility];  Service: Urology;  Laterality: Bilateral;   TUBAL LIGATION  ~ 1988   Patient Active Problem List   Diagnosis Date Noted   Port-A-Cath in place 01/14/2021   Urethral CA ([place]) 10/10/2020  Infection of mandible 05/29/2014   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  SUBJECTIVE:                                                                                                                                                                                                         SUBJECTIVE  STATEMENT:    Doing about the same with neck pain and LT shldr     PERTINENT HISTORY:  Reverse left shoulder.  PAIN:  Are you having pain? Sitting: mild. Movement: 5-6/10  PRECAUTIONS: Other: No ultrasound over left shoulder.  WEIGHT BEARING RESTRICTIONS  Avoid left UE weight bearing.  FALLS:  Has patient fallen in last 6 months? No  LIVING ENVIRONMENT: Lives in: House/apartment Has following equipment at home: None  OCCUPATION: Retired.  PLOF: Independent  PATIENT GOALS Turn head better with less pain.  OBJECTIVE:   PATIENT SURVEYS:  FOTO Complete.  POSTURE: rounded shoulders and forward head  PALPATION: Very tender to palpation over left mid to lower cervical and a palpable trigger point over her left UT.   CERVICAL ROM:  Active left cervical rotation is 50 degrees and right is 62 degrees.  MMT Normal left elbow strength.  CERVICAL SPECIAL TESTS:  Diminished left Biceps reflex compared to right.  TODAY'S TREATMENT: 07/07/22 Posture EXs: RED tband Rows 2x10 hold 5secs, shldr ext. 2x10 hold 5secs,  Reviewed HEP chin tucks and cerv. Rotation.ROM today 60 RT and 50. Manual: STW/TPR to  RT/LT UT as well as cerv. Paras seated pos. Traction: cervical, 15# max hold 99secs, 5# min hold 5 secs, 15 mins     Modalities  Date: 06/30/2022 Traction: cervical, 14# max 5# min, 15 mins mins Unattended Estim: Cervical, Pre-Mod, 15 mins, Pain and Tone Combo: Cervical, 1.2 w/cm2, 100%, 10 mins, Pain and Tone Hot Pack: Cervical, 15 mins, Pain and Tone  CLINICAL IMPRESSION: Pt arrived today doing about the same with neck and shldr pain. HEP reviewed and postural exs, with tband were performed and added to HEP with handout and band provided.Traction performed at 15#s today and tolerated well.   OBJECTIVE IMPAIRMENTS decreased ROM, increased muscle spasms, postural dysfunction, and pain.   PARTICIPATION LIMITATIONS: driving  PERSONAL FACTORS 1 comorbidity: Left reveres  total shoulder replacement  are also affecting patient's functional outcome.   REHAB POTENTIAL: Excellent  CLINICAL DECISION MAKING: Stable/uncomplicated  EVALUATION  COMPLEXITY: Low   GOALS:  LONG TERM GOALS: Target date: 08/04/2022  Ind with an HEP. Goal status: Met  2.  Increase active cervical rotation to 70 degrees+ so patient can turn head more easily while driving. Goal status: On-going   07-07-22 60 degrees RT, 50 degrees LT   3.  Perform ADL's with pain not > 3/10. Goal status: On-going   PLAN: PT FREQUENCY:  2-3 times a week for 4 weeks.  PT DURATION: 4 weeks  PLANNED INTERVENTIONS: Therapeutic exercises, Therapeutic activity, Patient/Family education, Self Care, Dry Needling, Electrical stimulation, Moist heat, Traction, Ultrasound, and Manual therapy  PLAN FOR NEXT SESSION: Combo e'stim/US (left cervical region), STW/M, chin tucks and extension, postural exercises, can try intermittent cervical traction beginning at 13#.    Yardley Lekas,CHRIS, PTA 07/07/2022, 10:01 AM

## 2022-07-09 ENCOUNTER — Ambulatory Visit: Payer: Medicare Other | Admitting: Physical Therapy

## 2022-07-09 DIAGNOSIS — M542 Cervicalgia: Secondary | ICD-10-CM

## 2022-07-09 DIAGNOSIS — R293 Abnormal posture: Secondary | ICD-10-CM

## 2022-07-09 NOTE — Therapy (Signed)
OUTPATIENT PHYSICAL THERAPY CERVICAL TREATMENT   Patient Name: Sandra Mann MRN: [id number] DOB:05/26/1951, 71 y.o., female Today's Date: 07/09/2022   PT End of Session - 07/09/22 1103     Visit Number 8    Number of Visits 12    Date for PT Re-Evaluation 07/14/22    Authorization Type FOTO AT LEAST EVERY 5TH VISIT.  PROGRESS NOTE AT 10TH VISIT.  KX MODIFIER AFTER 15 VISITS.    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   Wears glasses    Past Surgical History:  Procedure Laterality Date   COLONOSCOPY     CYSTOSCOPY WITH INJECTION N/A 03/04/2021   Procedure: CYSTOSCOPY WITH INJECTION OF INDOCYANINE GREEN DYE;  Surgeon: Alexis Frock, MD;  [facility];  Service: Urology;  Laterality: N/A;   CYSTOSCOPY WITH URETHRAL DILATATION N/A 09/23/2020   Procedure: CYSTOSCOPY WITH URETHRAL DILATATION, EXAM UNDER ANESTHESIA, BIOPSY OF URETHRAL MASS;  Surgeon: Robley Fries, MD;  [facility];  Service: Urology;  Laterality: N/A;   EXCISION ORAL TUMOR Left 03/27/2014   Procedure: EXCISION OF ORAL CANCER;  Surgeon: Izora Gala, MD;  Location: [place];  Service: ENT;  Laterality: Left;   FINGER GANGLION CYST EXCISION Right 2007   small dip   INCISION AND DRAINAGE ABSCESS Left 06/12/2014   Procedure: LEFT INCISION AND DRAINAGE MANDIBULAR ABSCESS;  Surgeon: Izora Gala, MD;  Location: [place];  Service: ENT;  Laterality: Left;   INCISION AND  DRAINAGE ABSCESS Left 11/22/2014   Procedure: INCISION AND DRAINAGE LEFT  MANDIBULAR ABSCESS;  Surgeon: Izora Gala, MD;  Location: [place];  Service: ENT;  Laterality: Left;   INCISION AND DRAINAGE OF PERITONSILLAR ABCESS Left 06/01/2014   Procedure: INCISION AND DRAINAGE Masticator Space Infection;  Surgeon: Izora Gala, MD;  Location: [place];  Service: ENT;  Laterality: Left;   IR IMAGING GUIDED PORT INSERTION  10/21/2020   LYMPH NODE DISSECTION Bilateral 03/04/2021   Procedure: LYMPH NODE DISSECTION;  Surgeon: Alexis Frock, MD;  [facility];  Service: Urology;  Laterality: Bilateral;   MULTIPLE EXTRACTIONS WITH ALVEOLOPLASTY Left 03/27/2014   Procedure: EXTRACTIONS OF NECESSARY TOOTH ;  Surgeon: Ceasar Mons, DDS;  Location: [place];  Service: Oral Surgery;  Laterality: Left;   REVERSE SHOULDER ARTHROPLASTY Left 09/03/2021   Procedure: REVERSE SHOULDER ARTHROPLASTY;  Surgeon: Justice Britain, MD;  [facility];  Service: Orthopedics;  Laterality: Left;   ROBOT ASSISTED LAPAROSCOPIC COMPLETE CYSTECT ILEAL CONDUIT N/A 03/04/2021   Procedure: XI ROBOTIC ASSISTED LAPAROSCOPIC COMPLETE CYSTECT ILEAL CONDUIT;  Surgeon: Alexis Frock, MD;  [facility];  Service: Urology;  Laterality: N/A;  6.5 HRS   ROBOTIC ASSISTED LAPAROSCOPIC HYSTERECTOMY AND SALPINGECTOMY Bilateral 03/04/2021   Procedure: XI ROBOTIC ASSISTED LAPAROSCOPIC HYSTERECTOMY AND SALPINGECTOMY;  Surgeon: Alexis Frock,  MD;  [facility];  Service: Urology;  Laterality: Bilateral;   TUBAL LIGATION  ~ 1988   Patient Active Problem List   Diagnosis Date Noted   Port-A-Cath in place 01/14/2021   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  SUBJECTIVE:                                                                                                                                                                                                         SUBJECTIVE STATEMENT:    I hurt a lot after those exercises last treatment    PERTINENT HISTORY:  Reverse left shoulder.  PAIN:  Are you having pain? Sitting: mild. Movement: 6/10  PRECAUTIONS: Other: No ultrasound over left shoulder.  OBJECTIVE:     TODAY'S TREATMENT: 07/09/22 Low-level combo e'stim/US at 3.3 Mhz, 20% x 8 minutes to patient's left cervical region. Low-level Pre-mod e'stim (5 sec on, 5 sec off) x 10 minutes. Manual: STW/TPR to LT UT as well as cerv. Paras seated pos. (8 minutes) with ischemic release technique utilized. Traction: cervical, 15# max hold 90 secs, 5# min hold 5 secs, 10 mins (Triton machine).      CLINICAL IMPRESSION: Patients states she had increased pain from doing exercises last time.  She did well with treatment today and enjoyed traction (Triton machine) today stating she felt better following. GOALS:  LONG TERM GOALS: Target date: 08/06/2022  Ind with an HEP. Goal status: Met  2.  Increase active cervical rotation to 70 degrees+ so patient can turn head more easily while driving. Goal status: On-going   07-07-22 60 degrees RT, 50 degrees LT   3.  Perform ADL's with pain not > 3/10. Goal status: On-going   PLAN: PT FREQUENCY:  2-3 times a week for 4 weeks.  PT DURATION: 4 weeks  PLANNED INTERVENTIONS: Therapeutic exercises, Therapeutic activity, Patient/Family education, Self Care, Dry Needling, Electrical stimulation, Moist heat, Traction, Ultrasound, and Manual therapy  PLAN FOR NEXT SESSION: Combo e'stim/US (left cervical region), STW/M, chin tucks and extension, postural exercises, can try intermittent cervical traction beginning at 13#.    Syann Cupples, [place], PT 07/09/2022, 11:06 AM

## 2022-07-12 ENCOUNTER — Encounter: Payer: Self-pay | Admitting: Physical Therapy

## 2022-07-12 ENCOUNTER — Ambulatory Visit: Payer: Medicare Other | Admitting: Physical Therapy

## 2022-07-12 DIAGNOSIS — R293 Abnormal posture: Secondary | ICD-10-CM

## 2022-07-12 DIAGNOSIS — M542 Cervicalgia: Secondary | ICD-10-CM | POA: Diagnosis not present

## 2022-07-12 NOTE — Therapy (Addendum)
OUTPATIENT PHYSICAL THERAPY CERVICAL TREATMENT   Patient Name: Sandra Mann MRN: [id number] DOB:[birth year], 71 y.o., female Today's Date: 07/12/2022   PT End of Session - 07/12/22 1008     Visit Number 9    Number of Visits 12    Date for PT Re-Evaluation 07/14/22    Authorization Type FOTO AT LEAST EVERY 5TH VISIT.  PROGRESS NOTE AT 10TH VISIT.  KX MODIFIER AFTER 15 VISITS.    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   Wears glasses    Past Surgical History:  Procedure Laterality Date   COLONOSCOPY     CYSTOSCOPY WITH INJECTION N/A 03/04/2021   Procedure: CYSTOSCOPY WITH INJECTION OF INDOCYANINE GREEN DYE;  Surgeon: Alexis Frock, MD;  [facility];  Service: Urology;  Laterality: N/A;   CYSTOSCOPY WITH URETHRAL DILATATION N/A 09/23/2020   Procedure: CYSTOSCOPY WITH URETHRAL DILATATION, EXAM UNDER ANESTHESIA, BIOPSY OF URETHRAL MASS;  Surgeon: Robley Fries, MD;  [facility];  Service: Urology;  Laterality: N/A;   EXCISION ORAL TUMOR Left 03/27/2014   Procedure: EXCISION OF ORAL CANCER;  Surgeon: Izora Gala, MD;  Location: [place];  Service: ENT;  Laterality: Left;   FINGER GANGLION CYST EXCISION Right 2007   small dip   INCISION AND DRAINAGE ABSCESS Left 06/12/2014   Procedure: LEFT INCISION AND DRAINAGE MANDIBULAR ABSCESS;  Surgeon: Izora Gala, MD;  Location: [place];  Service: ENT;  Laterality: Left;   INCISION AND  DRAINAGE ABSCESS Left 11/22/2014   Procedure: INCISION AND DRAINAGE LEFT  MANDIBULAR ABSCESS;  Surgeon: Izora Gala, MD;  Location: [place];  Service: ENT;  Laterality: Left;   INCISION AND DRAINAGE OF PERITONSILLAR ABCESS Left 06/01/2014   Procedure: INCISION AND DRAINAGE Masticator Space Infection;  Surgeon: Izora Gala, MD;  Location: [place];  Service: ENT;  Laterality: Left;   IR IMAGING GUIDED PORT INSERTION  10/21/2020   LYMPH NODE DISSECTION Bilateral 03/04/2021   Procedure: LYMPH NODE DISSECTION;  Surgeon: Alexis Frock, MD;  [facility];  Service: Urology;  Laterality: Bilateral;   MULTIPLE EXTRACTIONS WITH ALVEOLOPLASTY Left 03/27/2014   Procedure: EXTRACTIONS OF NECESSARY TOOTH ;  Surgeon: Ceasar Mons, DDS;  Location: [place];  Service: Oral Surgery;  Laterality: Left;   REVERSE SHOULDER ARTHROPLASTY Left 09/03/2021   Procedure: REVERSE SHOULDER ARTHROPLASTY;  Surgeon: Justice Britain, MD;  [facility];  Service: Orthopedics;  Laterality: Left;   ROBOT ASSISTED LAPAROSCOPIC COMPLETE CYSTECT ILEAL CONDUIT N/A 03/04/2021   Procedure: XI ROBOTIC ASSISTED LAPAROSCOPIC COMPLETE CYSTECT ILEAL CONDUIT;  Surgeon: Alexis Frock, MD;  [facility];  Service: Urology;  Laterality: N/A;  6.5 HRS   ROBOTIC ASSISTED LAPAROSCOPIC HYSTERECTOMY AND SALPINGECTOMY Bilateral 03/04/2021   Procedure: XI ROBOTIC ASSISTED LAPAROSCOPIC HYSTERECTOMY AND SALPINGECTOMY;  Surgeon: Alexis Frock,  MD;  [facility];  Service: Urology;  Laterality: Bilateral;   TUBAL LIGATION  ~ 1988   Patient Active Problem List   Diagnosis Date Noted   Port-A-Cath in place 01/14/2021   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  SUBJECTIVE:                                                                                                                                                                                                         SUBJECTIVE STATEMENT:    I still hurt even when I don't exercise.  Any movement of shoulder really hurts.  PERTINENT HISTORY:  Reverse left shoulder.  PAIN:  Are you having pain? Sitting: mild. Movement: 6/10  PRECAUTIONS: Other: No ultrasound over left shoulder.  OBJECTIVE:     TODAY'S TREATMENT: 07/09/22 Low-level combo e'stim/US at 3.3 Mhz, 10% x 8 minutes to patient's left cervical region. Low-level IFC at 80-150 Hz at 40% x 15 minutes. Traction: cervical, 15# max hold 90 secs, 5# min hold 5 secs, 10 mins (Triton machine).      CLINICAL IMPRESSION: Patient states she still has pain even without exercising.  Patient tolerated treatment without complaint but has not found PT very helpful.  Normal modality response following removal of modality.  Likely discharge next visit. GOALS:  LONG TERM GOALS: Target date: 08/09/2022  Ind with an HEP. Goal status: Met  2.  Increase active cervical rotation to 70 degrees+ so patient can turn head more easily while driving. Goal status: On-going   07-07-22 60 degrees RT, 50 degrees LT   3.  Perform ADL's with pain not > 3/10. Goal status: On-going   PLAN: PT FREQUENCY:  2-3 times a week for 4 weeks.  PT DURATION: 4 weeks  PLANNED INTERVENTIONS: Therapeutic exercises, Therapeutic activity, Patient/Family education, Self Care, Dry Needling, Electrical stimulation, Moist heat, Traction, Ultrasound, and Manual therapy  PLAN FOR NEXT SESSION: Combo e'stim/US (left cervical region), STW/M, chin tucks and extension, postural exercises, can try intermittent cervical traction beginning at 13#.    Darell Saputo, [place], PT 07/12/2022, 10:56 AM

## 2022-07-14 ENCOUNTER — Ambulatory Visit: Payer: Medicare Other | Admitting: Physical Therapy

## 2022-07-14 ENCOUNTER — Encounter: Payer: Self-pay | Admitting: Physical Therapy

## 2022-07-14 DIAGNOSIS — M542 Cervicalgia: Secondary | ICD-10-CM

## 2022-07-14 DIAGNOSIS — R293 Abnormal posture: Secondary | ICD-10-CM

## 2022-07-14 NOTE — Therapy (Addendum)
OUTPATIENT PHYSICAL THERAPY CERVICAL TREATMENT   Patient Name: Sandra Mann MRN: [id number] DOB:05-03-1951, 71 y.o., female Today's Date: 07/14/2022   PT End of Session - 07/14/22 1010     Visit Number 10    Number of Visits 12    Date for PT Re-Evaluation 07/14/22    Authorization Type FOTO AT LEAST EVERY 5TH VISIT.  PROGRESS NOTE AT 10TH VISIT.  KX MODIFIER AFTER 15 VISITS.    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   Wears glasses    Past Surgical History:  Procedure Laterality Date   COLONOSCOPY     CYSTOSCOPY WITH INJECTION N/A 03/04/2021   Procedure: CYSTOSCOPY WITH INJECTION OF INDOCYANINE GREEN DYE;  Surgeon: Alexis Frock, MD;  Location: [facility];  Service: Urology;  Laterality: N/A;   CYSTOSCOPY WITH URETHRAL DILATATION N/A 09/23/2020   Procedure: CYSTOSCOPY WITH URETHRAL DILATATION, EXAM UNDER ANESTHESIA, BIOPSY OF URETHRAL MASS;  Surgeon: Robley Fries, MD;  Location: [facility];  Service: Urology;  Laterality: N/A;   EXCISION ORAL TUMOR Left 03/27/2014   Procedure: EXCISION OF ORAL CANCER;  Surgeon: Izora Gala, MD;  Location: [place];  Service: ENT;  Laterality: Left;   FINGER GANGLION CYST EXCISION Right 2007   small dip   INCISION AND DRAINAGE ABSCESS Left 06/12/2014   Procedure: LEFT INCISION AND DRAINAGE MANDIBULAR ABSCESS;  Surgeon: Izora Gala, MD;  Location: [place];  Service: ENT;  Laterality: Left;   INCISION AND  DRAINAGE ABSCESS Left 11/22/2014   Procedure: INCISION AND DRAINAGE LEFT  MANDIBULAR ABSCESS;  Surgeon: Izora Gala, MD;  Location: [place];  Service: ENT;  Laterality: Left;   INCISION AND DRAINAGE OF PERITONSILLAR ABCESS Left 06/01/2014   Procedure: INCISION AND DRAINAGE Masticator Space Infection;  Surgeon: Izora Gala, MD;  Location: [place];  Service: ENT;  Laterality: Left;   IR IMAGING GUIDED PORT INSERTION  10/21/2020   LYMPH NODE DISSECTION Bilateral 03/04/2021   Procedure: LYMPH NODE DISSECTION;  Surgeon: Alexis Frock, MD;  Location: [facility];  Service: Urology;  Laterality: Bilateral;   MULTIPLE EXTRACTIONS WITH ALVEOLOPLASTY Left 03/27/2014   Procedure: EXTRACTIONS OF NECESSARY TOOTH ;  Surgeon: Ceasar Mons, DDS;  Location: [place];  Service: Oral Surgery;  Laterality: Left;   REVERSE SHOULDER ARTHROPLASTY Left 09/03/2021   Procedure: REVERSE SHOULDER ARTHROPLASTY;  Surgeon: Justice Britain, MD;  Location: [facility];  Service: Orthopedics;  Laterality: Left;   ROBOT ASSISTED LAPAROSCOPIC COMPLETE CYSTECT ILEAL CONDUIT N/A 03/04/2021   Procedure: XI ROBOTIC ASSISTED LAPAROSCOPIC COMPLETE CYSTECT ILEAL CONDUIT;  Surgeon: Alexis Frock, MD;  Location: [facility];  Service: Urology;  Laterality: N/A;  6.5 HRS   ROBOTIC ASSISTED LAPAROSCOPIC HYSTERECTOMY AND SALPINGECTOMY Bilateral 03/04/2021   Procedure: XI ROBOTIC ASSISTED LAPAROSCOPIC HYSTERECTOMY AND SALPINGECTOMY;  Surgeon: Tresa Moore,  Hubbard Robinson, MD;  Location: [facility];  Service: Urology;  Laterality: Bilateral;   TUBAL LIGATION  ~ 1988   Patient Active Problem List   Diagnosis Date Noted   Port-A-Cath in place 01/14/2021   Urethral CA ([place]) 10/10/2020   Infection of mandible 05/29/2014   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  SUBJECTIVE:                                                                                                                                                                                                         SUBJECTIVE STATEMENT:    I still hurt even when I don't exercise.  Any movement of shoulder really hurts.  PERTINENT HISTORY:  Reverse left shoulder.  PAIN:  Are you having pain? Sitting: mild. Movement: major pain  PRECAUTIONS: Other: No ultrasound over left shoulder.  OBJECTIVE:    FOTO:  39% limitation   CERVICAL ROM:   Active ROM A/PROM (deg)   Flexion   Extension   Right lateral flexion   Left lateral flexion   Right rotation 60  Left rotation 41   (Blank rows = not tested)   TODAY'S TREATMENT:  Low-level combo e'stim/US at 1.5 w/cm2, 50% x 8 minutes to patient's left cervical region. Low-level IFC at 80-150 Hz at 40% x 15 minutes with moist heat to L UT/shoulder Traction: cervical, 15# max hold 90 secs, 5# min hold 5 secs, 15 mins (Triton machine).  CLINICAL IMPRESSION: Patient has not achieved goals or made significant progress with PT as she is still experiencing high level pain with certain cervical rotation and LUE movement. Patient has been using a "candy cane" pillow to sleep but doesn't know if that is helping any. Patient limited with ADLs such as cleaning or reaching due to the sharp L shoulder pain. Normal modalities response noted following removal of the modalities.  GOALS:  LONG TERM GOALS: Target date: 08/11/2022  Ind with an HEP. Goal status: Met  2.  Increase active cervical rotation to 70 degrees+ so patient can turn head more easily while driving. Goal status:   3.  Perform ADL's with pain not > 3/10. Goal status: Not met   PLAN: PT FREQUENCY:  2-3 times a week for 4 weeks.  PT DURATION: 4 weeks  PLANNED INTERVENTIONS: Therapeutic exercises, Therapeutic activity, Patient/Family education, Self Care, Dry Needling,  Electrical  stimulation, Moist heat, Traction, Ultrasound, and Manual therapy  PLAN FOR NEXT SESSION: Combo e'stim/US (left cervical region), STW/M, chin tucks and extension, postural exercises, can try intermittent cervical traction beginning at 13#.    Standley Brooking, PTA 07/14/2022, 11:55 AM     PHYSICAL THERAPY DISCHARGE SUMMARY  Visits from Start of Care: 10.  Current functional level related to goals / functional outcomes: See above.   Remaining deficits: See above.   Education / Equipment: HEP.   Patient agrees to discharge. Patient goals were partially met. Patient is being discharged due to lack of progress.    Mali Applegate MPT

## 2022-07-22 ENCOUNTER — Inpatient Hospital Stay: Payer: Medicare Other | Attending: Oncology

## 2022-07-22 ENCOUNTER — Other Ambulatory Visit: Payer: Self-pay

## 2022-07-22 ENCOUNTER — Ambulatory Visit (HOSPITAL_COMMUNITY)
Admission: RE | Admit: 2022-07-22 | Discharge: 2022-07-22 | Disposition: A | Discharge status: Home / Self Care | Payer: Medicare Other | Source: Ambulatory Visit | Attending: Oncology | Admitting: Oncology

## 2022-07-22 DIAGNOSIS — C680 Malignant neoplasm of urethra: Secondary | ICD-10-CM | POA: Insufficient documentation

## 2022-07-22 DIAGNOSIS — Z79899 Other long term (current) drug therapy: Secondary | ICD-10-CM | POA: Diagnosis not present

## 2022-07-22 DIAGNOSIS — Z5112 Encounter for antineoplastic immunotherapy: Secondary | ICD-10-CM | POA: Diagnosis present

## 2022-07-22 LAB — CBC WITH DIFFERENTIAL (CANCER CENTER ONLY)
Abs Immature Granulocytes: 0.01 10*3/uL (ref 0.00–0.07)
Basophils Absolute: 0.1 10*3/uL (ref 0.0–0.1)
Basophils Relative: 1 %
Eosinophils Absolute: 0.1 10*3/uL (ref 0.0–0.5)
Eosinophils Relative: 2 %
HCT: 35.9 % — ABNORMAL LOW (ref 36.0–46.0)
Hemoglobin: 12.7 g/dL (ref 12.0–15.0)
Immature Granulocytes: 0 %
Lymphocytes Relative: 28 %
Lymphs Abs: 1.7 10*3/uL (ref 0.7–4.0)
MCH: 33.7 pg (ref 26.0–34.0)
MCHC: 35.4 g/dL (ref 30.0–36.0)
MCV: 95.2 fL (ref 80.0–100.0)
Monocytes Absolute: 0.6 10*3/uL (ref 0.1–1.0)
Monocytes Relative: 10 %
Neutro Abs: 3.6 10*3/uL (ref 1.7–7.7)
Neutrophils Relative %: 59 %
Platelet Count: 232 10*3/uL (ref 150–400)
RBC: 3.77 MIL/uL — ABNORMAL LOW (ref 3.87–5.11)
RDW: 13.2 % (ref 11.5–15.5)
WBC Count: 6.1 10*3/uL (ref 4.0–10.5)
nRBC: 0 % (ref 0.0–0.2)

## 2022-07-22 LAB — CMP (CANCER CENTER ONLY)
ALT: 15 U/L (ref 0–44)
AST: 26 U/L (ref 15–41)
Albumin: 4.4 g/dL (ref 3.5–5.0)
Alkaline Phosphatase: 58 U/L (ref 38–126)
Anion gap: 7 (ref 5–15)
BUN: 12 mg/dL (ref 8–23)
CO2: 27 mmol/L (ref 22–32)
Calcium: 9 mg/dL (ref 8.9–10.3)
Chloride: 102 mmol/L (ref 98–111)
Creatinine: 0.75 mg/dL (ref 0.44–1.00)
GFR, Estimated: >60 mL/min (ref >60)
Glucose, Bld: 92 mg/dL (ref 70–99)
Potassium: 4 mmol/L (ref 3.5–5.1)
Sodium: 136 mmol/L (ref 135–145)
Total Bilirubin: 1.1 mg/dL (ref 0.3–1.2)
Total Protein: 6.7 g/dL (ref 6.5–8.1)

## 2022-07-22 LAB — TSH: TSH: 1.529 u[IU]/mL (ref 0.350–4.500)

## 2022-07-22 MED ORDER — HEPARIN SOD (PORK) LOCK FLUSH 100 UNIT/ML IV SOLN
INTRAVENOUS | Status: AC
  Filled 2022-07-22: qty 5

## 2022-07-22 MED ORDER — SODIUM CHLORIDE (PF) 0.9 % IJ SOLN
INTRAMUSCULAR | Status: AC
  Filled 2022-07-22: qty 50

## 2022-07-22 MED ORDER — HEPARIN SOD (PORK) LOCK FLUSH 100 UNIT/ML IV SOLN
500.0000 [IU] | Freq: Once | INTRAVENOUS | Status: AC
  Administered 2022-07-22: 500 [IU] via INTRAVENOUS

## 2022-07-22 MED ORDER — IOHEXOL 300 MG/ML  SOLN
100.0000 mL | Freq: Once | INTRAMUSCULAR | Status: AC | PRN
  Administered 2022-07-22: 100 mL via INTRAVENOUS

## 2022-07-28 ENCOUNTER — Ambulatory Visit: Payer: Medicare Other | Admitting: Oncology

## 2022-07-29 ENCOUNTER — Ambulatory Visit: Payer: Medicare Other | Admitting: Oncology

## 2022-08-04 ENCOUNTER — Inpatient Hospital Stay: Payer: Medicare Other | Attending: Oncology | Admitting: Oncology

## 2022-08-04 VITALS — BP 131/57 | HR 74 | Temp 97.3°F | Resp 16 | Ht 63.5 in | Wt 115.2 lb

## 2022-08-04 DIAGNOSIS — Z8551 Personal history of malignant neoplasm of bladder: Secondary | ICD-10-CM | POA: Insufficient documentation

## 2022-08-04 DIAGNOSIS — C680 Malignant neoplasm of urethra: Secondary | ICD-10-CM | POA: Diagnosis not present

## 2022-08-04 DIAGNOSIS — Z08 Encounter for follow-up examination after completed treatment for malignant neoplasm: Secondary | ICD-10-CM | POA: Insufficient documentation

## 2022-08-04 MED ORDER — PROCHLORPERAZINE MALEATE 10 MG PO TABS: 10.0000 mg | ORAL_TABLET | Freq: Four times a day (QID) | ORAL | 1 refills | Status: DC | PRN

## 2022-08-04 NOTE — Progress Notes (Signed)
Hematology and Oncology Follow Up Visit  Sandra Mann [id number] 1951/07/18 71 y.o. 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  Prior Therapy: She is status post cystoscopy and biopsy in November 2021 which showed high-grade urothelial carcinoma. Neoadjuvant chemotherapy utilizing gemcitabine and cisplatin started on October 29, 2020.  She completed 4 cycles of therapy on January 07, 2021.  She is status post robotic assisted laparoscopic cystectomy, hysterectomy and lymph node dissection completed on Mar 04, 2021 completed by Dr. Tammi Klippel.  He final pathology showed T4N1 disease.  She had 1 out of 16 lymph node involvement.   She is status post Nivolumab 480 mg every 4 weeks started in July 2022.  She completed 12 months of therapy in June 2023.  Current therapy: Active surveillance.   Interim History: [name] presents today for return evaluation.  Since the last visit, she completed 1 year of adjuvant nivolumab therapy without any residual complications.  She does have symptoms of nausea periodically but no vomiting.  She was diagnosed with pneumonia in July.  She denies any abdominal pain or distention.  She has reported issues with constipation.   Medications: Updated on review. Current Outpatient Medications  Medication Sig Dispense Refill   acetaminophen (TYLENOL) 500 MG tablet Take 500 mg by mouth every 6 (six) hours as needed for moderate pain.     cycloSPORINE (RESTASIS) 0.05 % ophthalmic emulsion Place 1 drop into both eyes 2 (two) times daily.     folic acid (FOLVITE) 767 MCG tablet Take 1,600 mcg by mouth daily.      lidocaine-prilocaine (EMLA) cream Apply 1 application. topically as needed. 30 g 3   Melatonin 10 MG TABS Take 10 mg by mouth at bedtime.     Melatonin 10 MG TBCR Take 10 mg by mouth See admin instructions.      mesalamine (CANASA) 1000 MG suppository Place 1,000 mg rectally every other day. At bedtime     methotrexate (RHEUMATREX) 2.5 MG tablet Take 20 mg by mouth every Tuesday.     Multiple Vitamin (MULTIVITAMIN WITH MINERALS) TABS tablet Take 1 tablet by mouth at bedtime. Silver     omeprazole (PRILOSEC) 40 MG capsule Take 40 mg by mouth daily.     valACYclovir (VALTREX) 500 MG tablet Take 1 tablet (500 mg total) by mouth daily. 14 tablet 1   No current facility-administered medications for this visit.     Allergies:  Allergies  Allergen Reactions   Codeine Nausea And Vomiting    Other reaction(s): Unknown   Sulfa Antibiotics Nausea And Vomiting   Vicodin [Hydrocodone-Acetaminophen] Nausea And Vomiting   Other     Other reaction(s): vomiting   Sulfur     Other reaction(s): Unknown      Physical Exam:        ECOG: 0    General appearance: Alert, awake without any distress. Head: Atraumatic without abnormalities Oropharynx: Without any thrush or ulcers. Eyes: No scleral icterus. Lymph nodes: No lymphadenopathy noted in the cervical, supraclavicular, or axillary nodes Heart:regular rate and rhythm, without any murmurs or gallops.   Lung: Clear to auscultation without any rhonchi, wheezes or dullness to percussion. Abdomin: Soft, nontender without any shifting dullness or ascites. Musculoskeletal: No clubbing or cyanosis. Neurological: No motor or sensory deficits. Skin: No rashes or lesions.  Lab Results: Lab Results  Component Value Date   WBC 6.1 07/22/2022   HGB 12.7 07/22/2022   HCT 35.9 (L) 07/22/2022   MCV 95.2 07/22/2022   PLT 232 07/22/2022     Chemistry      Component Value Date/Time   NA 136 07/22/2022 1012   K 4.0 07/22/2022 1012   CL 102 07/22/2022 1012   CO2 27 07/22/2022 1012   BUN 12 07/22/2022 1012   CREATININE 0.75 07/22/2022 1012      Component Value Date/Time   CALCIUM 9.0 07/22/2022 1012   ALKPHOS 58  07/22/2022 1012   AST 26 07/22/2022 1012   ALT 15 07/22/2022 1012   BILITOT 1.1 07/22/2022 1012       EXAM: CT CHEST, ABDOMEN, AND PELVIS WITH CONTRAST   TECHNIQUE: Multidetector CT imaging of the chest, abdomen and pelvis was performed following the standard protocol during bolus administration of intravenous contrast.   RADIATION DOSE REDUCTION: This exam was performed according to the departmental dose-optimization program which includes automated exposure control, adjustment of the mA and/or kV according to patient size and/or use of iterative reconstruction technique.   CONTRAST:  163m OMNIPAQUE IOHEXOL 300 MG/ML  SOLN   COMPARISON:  CT chest abdomen pelvis 01/26/2022.   FINDINGS: CT CHEST FINDINGS   Cardiovascular: Right IJ Port-A-Cath terminates in the right atrium. Atherosclerotic calcification of the aorta and coronary arteries. Heart is at the upper limits of normal in size. No pericardial effusion.   Mediastinum/Nodes: No pathologically enlarged mediastinal, hilar or axillary lymph nodes. Esophagus is grossly unremarkable.   Lungs/Pleura: Biapical pleuroparenchymal scarring. Scarring in the anterior left lower lobe. No suspicious pulmonary nodules. No pleural fluid. Airway is unremarkable.   Musculoskeletal: Degenerative changes in the spine. No worrisome lytic or sclerotic lesions. Left shoulder arthroplasty.   CT ABDOMEN PELVIS FINDINGS   Hepatobiliary: Liver and gallbladder are unremarkable. No biliary ductal dilatation.   Pancreas: Negative.   Spleen: Negative.   Adrenals/Urinary Tract: Adrenal glands and kidneys are unremarkable. Ureters are decompressed. Cystectomy. Right lower quadrant ileal conduit.   Stomach/Bowel: Stomach and majority of the small bowel are unremarkable with exception of a right lower quadrant ileal conduit. Colon is unremarkable. Appendix isn't readily visualized.   Vascular/Lymphatic: Atherosclerotic calcification of  the aorta. No pathologically enlarged lymph nodes.   Reproductive: Hysterectomy.  No adnexal mass.   Other: No free fluid.  Mesenteries and peritoneum are unremarkable.   Musculoskeletal: Degenerative changes in the spine. No worrisome lytic or sclerotic lesions. Grade 1 anterolisthesis of L4 on L5 with minimal retrolisthesis of L5 on S1.   IMPRESSION: 1. Cystectomy with a right lower quadrant ileal conduit. No evidence metastatic disease. 2. Aortic atherosclerosis (ICD10-I70.0). Coronary artery calcification.    Impression and Plan:  71year old woman with:   1.  High-grade urothelial carcinoma of the urethra diagnosed 2021.  She was found to have T4N1 tumor without any evidence of metastatic disease.   She is currently on active surveillance and will continue to have repeat imaging studies every 6 months.  Imaging studies obtained on July 22, 2022 were personally reviewed and showed no evidence of disease relapse.  These options include oral targeted therapy if she harbors the appropriate mutation such as FGFR as well as antibody drug conjugate including Padcev among others.   2.  IV access: Port-A-Cath will continue to be flushed periodically.  Risks and benefits of removing it was also discussed.   3.  Antiemetics: Compazine will be refilled for  her.  Her nausea is unrelated to malignancy or treatment based on imaging studies.  I recommended follow-up with GI regarding this issue.   4.  Goals of care: Therapy is curative at this time and aggressive measures are warranted.    5.  Follow-up: In 6 months for repeat imaging studies.   30  minutes were dedicated to this visit.  The time was spent on reviewing laboratory data, disease status update and outlining future plan of care review.  [name], MD 10/4/20239:39 AM

## 2022-08-06 ENCOUNTER — Other Ambulatory Visit: Payer: Self-pay

## 2022-09-27 ENCOUNTER — Telehealth: Payer: Self-pay | Admitting: Oncology

## 2022-09-27 NOTE — Telephone Encounter (Signed)
Called patient regarding all upcoming appointments and about providers departure, rescheduled April appointment with new provider.

## 2022-09-30 ENCOUNTER — Other Ambulatory Visit: Payer: Self-pay

## 2022-10-05 ENCOUNTER — Inpatient Hospital Stay: Payer: Medicare Other | Attending: Oncology

## 2022-10-05 DIAGNOSIS — Z452 Encounter for adjustment and management of vascular access device: Secondary | ICD-10-CM | POA: Insufficient documentation

## 2022-10-05 DIAGNOSIS — Z95828 Presence of other vascular implants and grafts: Secondary | ICD-10-CM

## 2022-10-05 DIAGNOSIS — Z8551 Personal history of malignant neoplasm of bladder: Secondary | ICD-10-CM | POA: Diagnosis not present

## 2022-10-05 MED ORDER — SODIUM CHLORIDE 0.9% FLUSH
10.0000 mL | Freq: Once | INTRAVENOUS | Status: AC
  Administered 2022-10-05: 10 mL

## 2022-10-05 MED ORDER — HEPARIN SOD (PORK) LOCK FLUSH 100 UNIT/ML IV SOLN
500.0000 [IU] | Freq: Once | INTRAVENOUS | Status: AC
  Administered 2022-10-05: 500 [IU]

## 2022-12-08 ENCOUNTER — Other Ambulatory Visit: Payer: Self-pay

## 2022-12-08 ENCOUNTER — Inpatient Hospital Stay: Payer: Medicare Other | Attending: Oncology

## 2022-12-08 DIAGNOSIS — Z95828 Presence of other vascular implants and grafts: Secondary | ICD-10-CM

## 2022-12-08 DIAGNOSIS — Z452 Encounter for adjustment and management of vascular access device: Secondary | ICD-10-CM | POA: Diagnosis not present

## 2022-12-08 DIAGNOSIS — Z8551 Personal history of malignant neoplasm of bladder: Secondary | ICD-10-CM | POA: Insufficient documentation

## 2022-12-08 MED ORDER — HEPARIN SOD (PORK) LOCK FLUSH 100 UNIT/ML IV SOLN
500.0000 [IU] | Freq: Once | INTRAVENOUS | Status: AC
  Administered 2022-12-08: 500 [IU]

## 2022-12-08 MED ORDER — SODIUM CHLORIDE 0.9% FLUSH
10.0000 mL | Freq: Once | INTRAVENOUS | Status: AC
  Administered 2022-12-08: 10 mL

## 2023-01-27 ENCOUNTER — Other Ambulatory Visit: Payer: Self-pay

## 2023-01-27 DIAGNOSIS — C680 Malignant neoplasm of urethra: Secondary | ICD-10-CM

## 2023-02-02 ENCOUNTER — Inpatient Hospital Stay: Payer: Medicare Other

## 2023-02-09 ENCOUNTER — Ambulatory Visit: Payer: Medicare Other | Admitting: Oncology

## 2023-02-09 ENCOUNTER — Ambulatory Visit: Payer: Medicare Other | Admitting: Hematology

## 2023-02-24 ENCOUNTER — Other Ambulatory Visit: Payer: Self-pay

## 2023-02-24 DIAGNOSIS — C680 Malignant neoplasm of urethra: Secondary | ICD-10-CM

## 2023-02-25 ENCOUNTER — Ambulatory Visit (HOSPITAL_COMMUNITY)
Admission: RE | Admit: 2023-02-25 | Discharge: 2023-02-25 | Disposition: A | Discharge status: Home / Self Care | Payer: Medicare Other | Source: Ambulatory Visit | Attending: Hematology | Admitting: Hematology

## 2023-02-25 ENCOUNTER — Inpatient Hospital Stay: Payer: Medicare Other | Attending: Oncology

## 2023-02-25 DIAGNOSIS — Z8551 Personal history of malignant neoplasm of bladder: Secondary | ICD-10-CM | POA: Diagnosis present

## 2023-02-25 DIAGNOSIS — Z95828 Presence of other vascular implants and grafts: Secondary | ICD-10-CM

## 2023-02-25 DIAGNOSIS — C680 Malignant neoplasm of urethra: Secondary | ICD-10-CM | POA: Diagnosis present

## 2023-02-25 DIAGNOSIS — Z452 Encounter for adjustment and management of vascular access device: Secondary | ICD-10-CM | POA: Diagnosis not present

## 2023-02-25 DIAGNOSIS — Z08 Encounter for follow-up examination after completed treatment for malignant neoplasm: Secondary | ICD-10-CM | POA: Insufficient documentation

## 2023-02-25 LAB — CBC WITH DIFFERENTIAL (CANCER CENTER ONLY)
Abs Immature Granulocytes: 0.01 10*3/uL (ref 0.00–0.07)
Basophils Absolute: 0 10*3/uL (ref 0.0–0.1)
Basophils Relative: 1 %
Eosinophils Absolute: 0.1 10*3/uL (ref 0.0–0.5)
Eosinophils Relative: 1 %
HCT: 36.8 % (ref 36.0–46.0)
Hemoglobin: 13.1 g/dL (ref 12.0–15.0)
Immature Granulocytes: 0 %
Lymphocytes Relative: 17 %
Lymphs Abs: 1.3 10*3/uL (ref 0.7–4.0)
MCH: 34.3 pg — ABNORMAL HIGH (ref 26.0–34.0)
MCHC: 35.6 g/dL (ref 30.0–36.0)
MCV: 96.3 fL (ref 80.0–100.0)
Monocytes Absolute: 0.4 10*3/uL (ref 0.1–1.0)
Monocytes Relative: 6 %
Neutro Abs: 5.6 10*3/uL (ref 1.7–7.7)
Neutrophils Relative %: 75 %
Platelet Count: 245 10*3/uL (ref 150–400)
RBC: 3.82 MIL/uL — ABNORMAL LOW (ref 3.87–5.11)
RDW: 13.2 % (ref 11.5–15.5)
WBC Count: 7.5 10*3/uL (ref 4.0–10.5)
nRBC: 0 % (ref 0.0–0.2)

## 2023-02-25 LAB — CMP (CANCER CENTER ONLY)
ALT: 32 U/L (ref 0–44)
AST: 41 U/L (ref 15–41)
Albumin: 4.4 g/dL (ref 3.5–5.0)
Alkaline Phosphatase: 76 U/L (ref 38–126)
Anion gap: 7 (ref 5–15)
BUN: 13 mg/dL (ref 8–23)
CO2: 27 mmol/L (ref 22–32)
Calcium: 9.6 mg/dL (ref 8.9–10.3)
Chloride: 107 mmol/L (ref 98–111)
Creatinine: 0.76 mg/dL (ref 0.44–1.00)
GFR, Estimated: >60 mL/min (ref >60)
Glucose, Bld: 92 mg/dL (ref 70–99)
Potassium: 4.2 mmol/L (ref 3.5–5.1)
Sodium: 141 mmol/L (ref 135–145)
Total Bilirubin: 1.2 mg/dL (ref 0.3–1.2)
Total Protein: 6.9 g/dL (ref 6.5–8.1)

## 2023-02-25 LAB — TSH: TSH: 1.209 u[IU]/mL (ref 0.350–4.500)

## 2023-02-25 MED ORDER — SODIUM CHLORIDE 0.9% FLUSH
10.0000 mL | Freq: Once | INTRAVENOUS | Status: AC
  Administered 2023-02-25: 10 mL

## 2023-02-25 MED ORDER — IOHEXOL 300 MG/ML  SOLN
100.0000 mL | Freq: Once | INTRAMUSCULAR | Status: AC | PRN
  Administered 2023-02-25: 100 mL via INTRAVENOUS

## 2023-03-01 ENCOUNTER — Other Ambulatory Visit: Payer: Self-pay

## 2023-03-01 ENCOUNTER — Inpatient Hospital Stay: Payer: Medicare Other | Admitting: Hematology

## 2023-03-01 VITALS — BP 125/59 | HR 67 | Temp 97.8°F | Resp 18 | Wt 123.1 lb

## 2023-03-01 DIAGNOSIS — Z95828 Presence of other vascular implants and grafts: Secondary | ICD-10-CM

## 2023-03-01 DIAGNOSIS — C680 Malignant neoplasm of urethra: Secondary | ICD-10-CM | POA: Diagnosis not present

## 2023-03-01 DIAGNOSIS — Z08 Encounter for follow-up examination after completed treatment for malignant neoplasm: Secondary | ICD-10-CM | POA: Diagnosis not present

## 2023-03-01 NOTE — Progress Notes (Signed)
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 Neoadjuvant chemotherapy utilizing gemcitabine and cisplatin started on October 29, 2020.  She completed 4 cycles of therapy on January 07, 2021.   She is status post robotic assisted laparoscopic cystectomy, hysterectomy and lymph node dissection completed on Mar 04, 2021 completed by Dr. Kathrynn Running.  He final pathology showed T4N1 disease.  She had 1 out of 16 lymph node involvement.     She is status post Nivolumab 480 mg every 4 weeks started in July 2022.  She completed 12 months of therapy in June 2023.   Current therapy: Active surveillance.  HISTORY OF PRESENTING ILLNESS:  Sandra Mann is a wonderful 72 y.o. female who has been a previous patient of Dr. Clelia Croft and has transferred her care to [place]. 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  Today, she is accompanied by her husband. She reports that she has been feeling well overall over the last 6 months. She denies any toxicities from her treatment.  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 She has endorsed these auto-immune conditions for about 15-20 years. Neither of these chronic diseases flared with immunotherapy.   She reports occasional joint swelling in her hands, but has not had a flare-up for a while. She reports that her Methotrexate dose  was previously lowered from 8 tablets to 6 in the past, which  resulted in issues with rheumatoid arthritis. The issues resolved once the dose was increased again. She does take Tylenol for pain management.  Patient denies any urinary infections, nutritional deficiencies, bleeding issues, diarrhea, abdominal pain, or leg swelling, and reports normal p.o. intake. Patient regularly takes Women 50+ vitamin supplements as well as folic acid.   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   Oral mass    left   Osteopenia    PONV (postoperative nausea and vomiting)    Rheumatoid arteritis (HCC)    Rheumatoid arthritis (HCC)    Ulcerative proctitis (HCC)    Ureteral cancer (HCC) dx'd 09/2020   Wears glasses     SURGICAL HISTORY: Past Surgical History:  Procedure Laterality Date   COLONOSCOPY     CYSTOSCOPY WITH INJECTION N/A 03/04/2021   Procedure: CYSTOSCOPY WITH INJECTION OF INDOCYANINE GREEN DYE;  Surgeon: Sebastian Ache, MD;  Location: [facility];  Service: Urology;  Laterality: N/A;   CYSTOSCOPY WITH URETHRAL DILATATION N/A 09/23/2020   Procedure: CYSTOSCOPY WITH URETHRAL DILATATION, EXAM UNDER ANESTHESIA, BIOPSY OF URETHRAL MASS;  Surgeon: Noel Christmas, MD;  Location: [facility];  Service: Urology;  Laterality: N/A;   EXCISION ORAL TUMOR Left 03/27/2014   Procedure: EXCISION OF ORAL CANCER;  Surgeon: Serena Colonel, MD;  Location: [facility];  Service: ENT;  Laterality: Left;   FINGER GANGLION CYST EXCISION Right 2007   small dip   INCISION AND DRAINAGE ABSCESS Left  06/12/2014   Procedure: LEFT INCISION AND DRAINAGE MANDIBULAR ABSCESS;  Surgeon: Serena Colonel, MD;  Location: [facility];  Service: ENT;  Laterality: Left;   INCISION AND DRAINAGE ABSCESS Left 11/22/2014   Procedure: INCISION AND DRAINAGE LEFT  MANDIBULAR ABSCESS;  Surgeon: Serena Colonel, MD;  Location: [facility];  Service: ENT;  Laterality: Left;   INCISION AND  DRAINAGE OF PERITONSILLAR ABCESS Left 06/01/2014   Procedure: INCISION AND DRAINAGE Masticator Space Infection;  Surgeon: Serena Colonel, MD;  Location: [facility];  Service: ENT;  Laterality: Left;   IR IMAGING GUIDED PORT INSERTION  10/21/2020   LYMPH NODE DISSECTION Bilateral 03/04/2021   Procedure: LYMPH NODE DISSECTION;  Surgeon: Sebastian Ache, MD;  Location: [facility];  Service: Urology;  Laterality: Bilateral;   MULTIPLE EXTRACTIONS WITH ALVEOLOPLASTY Left 03/27/2014   Procedure: EXTRACTIONS OF NECESSARY TOOTH ;  Surgeon: Hinton Dyer, DDS;  Location: [facility];  Service: Oral Surgery;  Laterality: Left;   REVERSE SHOULDER ARTHROPLASTY Left 09/03/2021   Procedure: REVERSE SHOULDER ARTHROPLASTY;  Surgeon: Francena Hanly, MD;  Location: [facility];  Service: Orthopedics;  Laterality: Left;   ROBOT ASSISTED LAPAROSCOPIC COMPLETE CYSTECT ILEAL CONDUIT N/A 03/04/2021   Procedure: XI ROBOTIC ASSISTED LAPAROSCOPIC COMPLETE CYSTECT ILEAL CONDUIT;  Surgeon: Sebastian Ache, MD;  Location: [facility];  Service: Urology;  Laterality: N/A;  6.5 HRS   ROBOTIC ASSISTED LAPAROSCOPIC HYSTERECTOMY AND SALPINGECTOMY Bilateral 03/04/2021   Procedure: XI ROBOTIC ASSISTED LAPAROSCOPIC HYSTERECTOMY AND SALPINGECTOMY;  Surgeon: Sebastian Ache, MD;  Location: [facility];  Service: Urology;  Laterality: Bilateral;   TUBAL LIGATION  ~ 1988    SOCIAL HISTORY: Social History   Socioeconomic History   Marital status: Married    Spouse name: Not on file   Number of children: Not on file   Years of education: Not on file   Highest education level: Not on file  Occupational History   Not on file  Tobacco Use   Smoking status: Former    Packs/day: 1.00    Years: 30.00    Additional pack years: 0.00    Total pack years: 30.00    Types: Cigarettes    Quit date: 03/20/2000    Years since quitting: 22.9   Smokeless tobacco: Never  Vaping Use   Vaping Use: Never used  Substance and Sexual Activity   Alcohol use: Yes     Comment: 05/28/2014 "drink a glass of wine ~ q night; none for the last week"   Drug use: No   Sexual activity: Not Currently    Birth control/protection: Post-menopausal  Other Topics Concern   Not on file  Social History Narrative   Not on file   Social Determinants of Health   Financial Resource Strain: Not on file  Food Insecurity: Not on file  Transportation Needs: Not on file  Physical Activity: Not on file  Stress: Not on file  Social Connections: Not on file  Intimate Partner Violence: Not on file    FAMILY HISTORY: Family History  Problem Relation Age of Onset   Heart attack Mother    Diabetes Maternal Grandmother     ALLERGIES:  is allergic to codeine, sulfa antibiotics, vicodin [hydrocodone-acetaminophen], other, and sulfur.  MEDICATIONS:  Current Outpatient Medications  Medication Sig Dispense Refill   acetaminophen (TYLENOL) 500 MG tablet Take 500 mg by mouth every 6 (six) hours as needed for moderate pain.     cycloSPORINE (RESTASIS) 0.05 % ophthalmic emulsion Place 1 drop into both eyes 2 (two)  times daily.     folic acid (FOLVITE) 800 MCG tablet Take 1,600 mcg by mouth daily.      lidocaine-prilocaine (EMLA) cream Apply 1 application. topically as needed. 30 g 3   Melatonin 10 MG TABS Take 10 mg by mouth at bedtime.     Melatonin 10 MG TBCR Take 10 mg by mouth See admin instructions.     mesalamine (CANASA) 1000 MG suppository Place 1,000 mg rectally every other day. At bedtime     methotrexate (RHEUMATREX) 2.5 MG tablet Take 20 mg by mouth every Tuesday.     Multiple Vitamin (MULTIVITAMIN WITH MINERALS) TABS tablet Take 1 tablet by mouth at bedtime. Silver     omeprazole (PRILOSEC) 40 MG capsule Take 40 mg by mouth daily.     prochlorperazine (COMPAZINE) 10 MG tablet Take 1 tablet (10 mg total) by mouth every 6 (six) hours as needed for nausea or vomiting. 30 tablet 1   valACYclovir (VALTREX) 500 MG tablet Take 1 tablet (500 mg total) by mouth daily. 14  tablet 1   No current facility-administered medications for this visit.    REVIEW OF SYSTEMS:    10 Point review of Systems was done is negative except as noted above.  PHYSICAL EXAMINATION: ECOG PERFORMANCE STATUS: 1 - Symptomatic but completely ambulatory  . Vitals:   03/01/23 1130  BP: (!) 125/59  Pulse: 67  Resp: 18  Temp: 97.8 F (36.6 C)  SpO2: 100%   Filed Weights   03/01/23 1130  Weight: 123 lb 1.6 oz (55.8 kg)   .Body mass index is 21.46 kg/m.  GENERAL:alert, in no acute distress and comfortable SKIN: no acute rashes, no significant lesions EYES: conjunctiva are pink and non-injected, sclera anicteric OROPHARYNX: MMM, no exudates, no oropharyngeal erythema or ulceration NECK: supple, no JVD LYMPH:  no palpable lymphadenopathy in the cervical, axillary or inguinal regions LUNGS: clear to auscultation b/l with normal respiratory effort HEART: regular rate & rhythm ABDOMEN:  normoactive bowel sounds , non tender, not distended. Extremity: no pedal edema PSYCH: alert & oriented x 3 with fluent speech NEURO: no focal motor/sensory deficits  LABORATORY DATA:  I have reviewed the data as listed .    Latest Ref Rng & Units 02/25/2023    8:44 AM 07/22/2022   10:12 AM 04/28/2022   11:53 AM  CBC  WBC 4.0 - 10.5 K/uL 7.5  6.1  6.9   Hemoglobin 12.0 - 15.0 g/dL 16.1  09.6  04.5   Hematocrit 36.0 - 46.0 % 36.8  35.9  33.6   Platelets 150 - 400 K/uL 245  232  205    .    Latest Ref Rng & Units 02/25/2023    8:44 AM 07/22/2022   10:12 AM 04/28/2022   11:53 AM  CMP  Glucose 70 - 99 mg/dL 92  92  90   BUN 8 - 23 mg/dL 13  12  13    Creatinine 0.44 - 1.00 mg/dL 4.09  8.11  9.14   Sodium 135 - 145 mmol/L 141  136  138   Potassium 3.5 - 5.1 mmol/L 4.2  4.0  3.7   Chloride 98 - 111 mmol/L 107  102  105   CO2 22 - 32 mmol/L 27  27  26    Calcium 8.9 - 10.3 mg/dL 9.6  9.0  9.1   Total Protein 6.5 - 8.1 g/dL 6.9  6.7  6.4   Total Bilirubin 0.3 - 1.2 mg/dL 1.2  1.1  1.0    Alkaline Phos 38 - 126 U/L 76  58  62   AST 15 - 41 U/L 41  26  24   ALT 0 - 44 U/L 32  15  13      RADIOGRAPHIC STUDIES: I have personally reviewed the radiological images as listed and agreed with the findings in the report. No results found.  ASSESSMENT & PLAN:   Wonderful 72 y.o. female with:  1.  High-grade urothelial carcinoma of the urethra diagnosed 2021.  She was found to have T4N1 tumor without any evidence of metastatic disease.    PLAN:  -Patient's medical history was reviewed in detail and confirmed with patient  -Discussed lab results from 02/25/2023 in detail with patient. CBC normal, showed WBC of 7.5K, hemoglobin of 13.1, and platelets of 245K. -No anemia -CMP stable, normal kidney and liver function -TSH level normal at 1.209 -discussed CT chest/abdm/pelvis scan on 02/25/2023 which showed no evidence of metastatic disease -No clinical sign or evidence of recurrent urothelial carcinoma at this time  -recommend lowest effective dose of Methotrexate for RA to avoid excessive immune suppression that might has a bearing on cancer recurrence risk. -advised patient to follow-up with rheumatology to discuss consideration of lowering dose of Methotrexate to limit suppression of immune system -port removal is reasonable at this time, will refer to radiologist for port removal. Advised patient to avoid strenuous activity during the healing process -will order CT chest/abdm/pelvis in 23 weeks -recommend repeat scans every 6 months for 3-4 years, then annual scans -continue Women 50+ and folic acid supplements   FOLLOW-UP: IR for port a cath removal in 1-2 weeks CT chest/abd/pelvis in 23 weeks RTC with Dr Candise Che with labs in 24 weeks  The total time spent in the appointment was 41 minutes* .  All of the patient's questions were answered with apparent satisfaction. The patient knows to call the clinic with any problems, questions or concerns.   Wyvonnia Lora MD MS AAHIVMS  [facility] Hematology/Oncology Physician [facility]  .*Total Encounter Time as defined by the Centers for Medicare and Medicaid Services includes, in addition to the face-to-face time of a patient visit (documented in the note above) non-face-to-face time: obtaining and reviewing outside history, ordering and reviewing medications, tests or procedures, care coordination (communications with other health care professionals or caregivers) and documentation in the medical record.    I,Mitra Faeizi,acting as a Neurosurgeon for Wyvonnia Lora, MD.,have documented all relevant documentation on the behalf of Wyvonnia Lora, MD,as directed by  Wyvonnia Lora, MD while in the presence of Wyvonnia Lora, MD.  .I have reviewed the above documentation for accuracy and completeness, and I agree with the above. Johney Maine MD

## 2023-03-02 ENCOUNTER — Telehealth: Payer: Self-pay | Admitting: Hematology

## 2023-03-08 ENCOUNTER — Encounter: Payer: Self-pay | Admitting: Hematology

## 2023-03-17 ENCOUNTER — Ambulatory Visit (HOSPITAL_COMMUNITY)
Admission: RE | Admit: 2023-03-17 | Discharge: 2023-03-17 | Disposition: A | Discharge status: Home / Self Care | Payer: Medicare Other | Source: Ambulatory Visit | Attending: Hematology | Admitting: Hematology

## 2023-03-17 DIAGNOSIS — Z452 Encounter for adjustment and management of vascular access device: Secondary | ICD-10-CM | POA: Insufficient documentation

## 2023-03-17 DIAGNOSIS — Z95828 Presence of other vascular implants and grafts: Secondary | ICD-10-CM

## 2023-03-17 DIAGNOSIS — Z8554 Personal history of malignant neoplasm of ureter: Secondary | ICD-10-CM | POA: Insufficient documentation

## 2023-03-17 DIAGNOSIS — C680 Malignant neoplasm of urethra: Secondary | ICD-10-CM

## 2023-03-17 HISTORY — PX: IR REMOVAL TUN ACCESS W/ PORT W/O FL MOD SED: IMG2290

## 2023-03-17 MED ORDER — LIDOCAINE HCL 1 % IJ SOLN
INTRAMUSCULAR | Status: AC
  Filled 2023-03-17: qty 20

## 2023-03-17 MED ORDER — LIDOCAINE HCL 1 % IJ SOLN
20.0000 mL | Freq: Once | INTRAMUSCULAR | Status: AC
  Administered 2023-03-17: 10 mL

## 2023-06-02 ENCOUNTER — Other Ambulatory Visit: Payer: Self-pay

## 2023-06-02 DIAGNOSIS — C680 Malignant neoplasm of urethra: Secondary | ICD-10-CM

## 2023-06-02 MED ORDER — VALACYCLOVIR HCL 500 MG PO TABS
500.0000 mg | ORAL_TABLET | Freq: Every day | ORAL | 1 refills | Status: DC
Start: 2023-06-02 — End: 2023-12-12

## 2023-08-09 ENCOUNTER — Ambulatory Visit (HOSPITAL_COMMUNITY)
Admission: RE | Admit: 2023-08-09 | Discharge: 2023-08-09 | Disposition: A | Discharge status: Home / Self Care | Payer: Medicare Other | Source: Ambulatory Visit | Attending: Hematology | Admitting: Hematology

## 2023-08-09 DIAGNOSIS — C680 Malignant neoplasm of urethra: Secondary | ICD-10-CM | POA: Diagnosis present

## 2023-08-09 MED ORDER — IOHEXOL 300 MG/ML  SOLN
100.0000 mL | Freq: Once | INTRAMUSCULAR | Status: AC | PRN
  Administered 2023-08-09: 100 mL via INTRAVENOUS

## 2023-08-13 ENCOUNTER — Other Ambulatory Visit: Payer: Self-pay

## 2023-08-13 DIAGNOSIS — C680 Malignant neoplasm of urethra: Secondary | ICD-10-CM

## 2023-08-15 ENCOUNTER — Inpatient Hospital Stay: Payer: Medicare Other | Attending: Hematology

## 2023-08-15 ENCOUNTER — Inpatient Hospital Stay: Payer: Medicare Other | Admitting: Hematology

## 2023-08-15 ENCOUNTER — Encounter: Payer: Self-pay | Admitting: Hematology

## 2023-08-15 VITALS — BP 137/61 | HR 59 | Temp 97.7°F | Resp 16 | Wt 126.0 lb

## 2023-08-15 DIAGNOSIS — M069 Rheumatoid arthritis, unspecified: Secondary | ICD-10-CM | POA: Diagnosis not present

## 2023-08-15 DIAGNOSIS — K5120 Ulcerative (chronic) proctitis without complications: Secondary | ICD-10-CM | POA: Insufficient documentation

## 2023-08-15 DIAGNOSIS — C680 Malignant neoplasm of urethra: Secondary | ICD-10-CM | POA: Diagnosis not present

## 2023-08-15 DIAGNOSIS — Z08 Encounter for follow-up examination after completed treatment for malignant neoplasm: Secondary | ICD-10-CM | POA: Diagnosis present

## 2023-08-15 DIAGNOSIS — Z8551 Personal history of malignant neoplasm of bladder: Secondary | ICD-10-CM | POA: Diagnosis present

## 2023-08-15 LAB — CBC WITH DIFFERENTIAL (CANCER CENTER ONLY)
Abs Immature Granulocytes: 0.04 10*3/uL (ref 0.00–0.07)
Basophils Absolute: 0 10*3/uL (ref 0.0–0.1)
Basophils Relative: 1 %
Eosinophils Absolute: 0.1 10*3/uL (ref 0.0–0.5)
Eosinophils Relative: 1 %
HCT: 37.8 % (ref 36.0–46.0)
Hemoglobin: 13.1 g/dL (ref 12.0–15.0)
Immature Granulocytes: 1 %
Lymphocytes Relative: 24 %
Lymphs Abs: 1.9 10*3/uL (ref 0.7–4.0)
MCH: 33.9 pg (ref 26.0–34.0)
MCHC: 34.7 g/dL (ref 30.0–36.0)
MCV: 97.7 fL (ref 80.0–100.0)
Monocytes Absolute: 1 10*3/uL (ref 0.1–1.0)
Monocytes Relative: 13 %
Neutro Abs: 5 10*3/uL (ref 1.7–7.7)
Neutrophils Relative %: 60 %
Platelet Count: 279 10*3/uL (ref 150–400)
RBC: 3.87 MIL/uL (ref 3.87–5.11)
RDW: 13.5 % (ref 11.5–15.5)
WBC Count: 8.1 10*3/uL (ref 4.0–10.5)
nRBC: 0 % (ref 0.0–0.2)

## 2023-08-15 LAB — CMP (CANCER CENTER ONLY)
ALT: 14 U/L (ref 0–44)
AST: 23 U/L (ref 15–41)
Albumin: 4.4 g/dL (ref 3.5–5.0)
Alkaline Phosphatase: 82 U/L (ref 38–126)
Anion gap: 9 (ref 5–15)
BUN: 9 mg/dL (ref 8–23)
CO2: 25 mmol/L (ref 22–32)
Calcium: 9.4 mg/dL (ref 8.9–10.3)
Chloride: 105 mmol/L (ref 98–111)
Creatinine: 0.77 mg/dL (ref 0.44–1.00)
GFR, Estimated: >60 mL/min
Glucose, Bld: 108 mg/dL — ABNORMAL HIGH (ref 70–99)
Potassium: 4.2 mmol/L (ref 3.5–5.1)
Sodium: 139 mmol/L (ref 135–145)
Total Bilirubin: 1 mg/dL (ref 0.3–1.2)
Total Protein: 7.2 g/dL (ref 6.5–8.1)

## 2023-08-15 LAB — TSH: TSH: 0.834 u[IU]/mL (ref 0.350–4.500)

## 2023-08-15 NOTE — Progress Notes (Signed)
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 Neoadjuvant chemotherapy utilizing gemcitabine and cisplatin started on October 29, 2020.  She completed 4 cycles of therapy on January 07, 2021.   She is status post robotic assisted laparoscopic cystectomy, hysterectomy and lymph node dissection completed on Mar 04, 2021 completed by Dr. Kathrynn Running.  He final pathology showed T4N1 disease.  She had 1 out of 16 lymph node involvement.     She is status post Nivolumab 480 mg every 4 weeks started in July 2022.  She completed 12 months of therapy in June 2023.   Current therapy: Active surveillance.  HISTORY OF PRESENTING ILLNESS:  Sandra Mann is a wonderful 72 y.o. female who has been a previous patient of Dr. Clelia Croft and has transferred her care to [place]. 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  Today, she is accompanied by her husband. She reports that she has been feeling well overall over the last 6 months. She denies any toxicities from her treatment.  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 She has endorsed these auto-immune conditions for about 15-20 years. Neither of these chronic diseases flared with immunotherapy.   She reports occasional joint swelling in her hands, but has not had a flare-up for a while. She reports that her Methotrexate dose  was previously lowered from 8 tablets to 6 in the past, which  resulted in issues with rheumatoid arthritis. The issues resolved once the dose was increased again. She does take Tylenol for pain management.  Patient denies any urinary infections, nutritional deficiencies, bleeding issues, diarrhea, abdominal pain, or leg swelling, and reports normal p.o. intake. Patient regularly takes Women 50+ vitamin supplements as well as folic acid.   INTERVAL HISTORY: Sandra Mann is a wonderful 72 y.o. 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   Patient was last seen by me on 03/01/2023 and she complained of occasional joint swelling.   Patient is accompanied by her husband during this visit. Patient notes she has been doing well overall since our last visit.   She denies any new infection issues, fever, chills, night sweats, abnormal bowel movement, abnormal urinary symptoms, hematuria, SOB, chest pain, abdominal pain, back pain, appetite loss, new lump/bumps, or leg swelling.   Patient notes she has been eating well and has been staying well hydrated.  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   Oral mass    left   Osteopenia    PONV (postoperative nausea and vomiting)    Rheumatoid arteritis (HCC)    Rheumatoid arthritis (HCC)    Ulcerative proctitis (HCC)    Ureteral cancer (HCC) dx'd 09/2020   Wears glasses     SURGICAL HISTORY: Past Surgical History:  Procedure Laterality Date   COLONOSCOPY     CYSTOSCOPY  WITH INJECTION N/A 03/04/2021   Procedure: CYSTOSCOPY WITH INJECTION OF INDOCYANINE GREEN DYE;  Surgeon: Sebastian Ache, MD;  Location: [facility];  Service: Urology;  Laterality: N/A;   CYSTOSCOPY WITH URETHRAL DILATATION N/A 09/23/2020   Procedure: CYSTOSCOPY WITH URETHRAL DILATATION, EXAM UNDER ANESTHESIA, BIOPSY OF URETHRAL MASS;  Surgeon:  Noel Christmas, MD;  Location: [facility];  Service: Urology;  Laterality: N/A;   EXCISION ORAL TUMOR Left 03/27/2014   Procedure: EXCISION OF ORAL CANCER;  Surgeon: Serena Colonel, MD;  Location: [facility];  Service: ENT;  Laterality: Left;   FINGER GANGLION CYST EXCISION Right 2007   small dip   INCISION AND DRAINAGE ABSCESS Left 06/12/2014   Procedure: LEFT INCISION AND DRAINAGE MANDIBULAR ABSCESS;  Surgeon: Serena Colonel, MD;  Location: [facility];  Service: ENT;  Laterality: Left;   INCISION AND DRAINAGE ABSCESS Left 11/22/2014   Procedure: INCISION AND DRAINAGE LEFT  MANDIBULAR ABSCESS;  Surgeon: Serena Colonel, MD;  Location: [facility];  Service: ENT;  Laterality: Left;   INCISION AND DRAINAGE OF PERITONSILLAR ABCESS Left 06/01/2014   Procedure: INCISION AND DRAINAGE Masticator Space Infection;  Surgeon: Serena Colonel, MD;  Location: [facility];  Service: ENT;  Laterality: Left;   IR IMAGING GUIDED PORT INSERTION  10/21/2020   IR REMOVAL TUN ACCESS W/ PORT W/O FL MOD SED  03/17/2023   LYMPH NODE DISSECTION Bilateral 03/04/2021   Procedure: LYMPH NODE DISSECTION;  Surgeon: Sebastian Ache, MD;  Location: [facility];  Service: Urology;  Laterality: Bilateral;   MULTIPLE EXTRACTIONS WITH ALVEOLOPLASTY Left 03/27/2014   Procedure: EXTRACTIONS OF NECESSARY TOOTH ;  Surgeon: Hinton Dyer, DDS;  Location: [facility];  Service: Oral Surgery;  Laterality: Left;   REVERSE SHOULDER ARTHROPLASTY Left 09/03/2021   Procedure: REVERSE SHOULDER ARTHROPLASTY;  Surgeon: Francena Hanly, MD;  Location: [facility];  Service: Orthopedics;  Laterality: Left;   ROBOT ASSISTED LAPAROSCOPIC COMPLETE CYSTECT ILEAL CONDUIT N/A 03/04/2021   Procedure: XI ROBOTIC ASSISTED LAPAROSCOPIC COMPLETE CYSTECT ILEAL CONDUIT;  Surgeon: Sebastian Ache, MD;  Location: [facility];  Service: Urology;  Laterality: N/A;  6.5 HRS   ROBOTIC ASSISTED LAPAROSCOPIC HYSTERECTOMY AND SALPINGECTOMY Bilateral 03/04/2021   Procedure: XI ROBOTIC ASSISTED  LAPAROSCOPIC HYSTERECTOMY AND SALPINGECTOMY;  Surgeon: Sebastian Ache, MD;  Location: [facility];  Service: Urology;  Laterality: Bilateral;   TUBAL LIGATION  ~ 1988    SOCIAL HISTORY: Social History   Socioeconomic History   Marital status: Married    Spouse name: Not on file   Number of children: Not on file   Years of education: Not on file   Highest education level: Not on file  Occupational History   Not on file  Tobacco Use   Smoking status: Former    Current packs/day: 0.00    Average packs/day: 1 pack/day for 30.0 years (30.0 ttl pk-yrs)    Types: Cigarettes    Start date: 03/20/1970    Quit date: 03/20/2000    Years since quitting: 23.4   Smokeless tobacco: Never  Vaping Use   Vaping status: Never Used  Substance and Sexual Activity   Alcohol use: Yes    Comment: 05/28/2014 "drink a glass of wine ~ q night; none for the last week"   Drug use: No   Sexual activity: Not Currently    Birth control/protection: Post-menopausal  Other Topics Concern   Not on file  Social History Narrative   Not on file   Social Determinants of Health   Financial  Resource Strain: Not on file  Food Insecurity: Not on file  Transportation Needs: Not on file  Physical Activity: Not on file  Stress: Not on file  Social Connections: Not on file  Intimate Partner Violence: Not on file    FAMILY HISTORY: Family History  Problem Relation Age of Onset   Heart attack Mother    Diabetes Maternal Grandmother     ALLERGIES:  is allergic to codeine, sulfa antibiotics, vicodin [hydrocodone-acetaminophen], other, and sulfur.  MEDICATIONS:  Current Outpatient Medications  Medication Sig Dispense Refill   acetaminophen (TYLENOL) 500 MG tablet Take 500 mg by mouth every 6 (six) hours as needed for moderate pain.     cycloSPORINE (RESTASIS) 0.05 % ophthalmic emulsion Place 1 drop into both eyes 2 (two) times daily.     folic acid (FOLVITE) 800 MCG tablet Take 1,600 mcg by mouth daily.       lidocaine-prilocaine (EMLA) cream Apply 1 application. topically as needed. 30 g 3   Melatonin 10 MG TABS Take 10 mg by mouth at bedtime.     Melatonin 10 MG TBCR Take 10 mg by mouth See admin instructions.     mesalamine (CANASA) 1000 MG suppository Place 1,000 mg rectally every other day. At bedtime     methotrexate (RHEUMATREX) 2.5 MG tablet Take 20 mg by mouth every Tuesday.     Multiple Vitamin (MULTIVITAMIN WITH MINERALS) TABS tablet Take 1 tablet by mouth at bedtime. Silver     omeprazole (PRILOSEC) 40 MG capsule Take 40 mg by mouth daily.     prochlorperazine (COMPAZINE) 10 MG tablet Take 1 tablet (10 mg total) by mouth every 6 (six) hours as needed for nausea or vomiting. 30 tablet 1   valACYclovir (VALTREX) 500 MG tablet Take 1 tablet (500 mg total) by mouth daily. 14 tablet 1   No current facility-administered medications for this visit.    REVIEW OF SYSTEMS:    10 Point review of Systems was done is negative except as noted above.  PHYSICAL EXAMINATION: ECOG PERFORMANCE STATUS: 1 - Symptomatic but completely ambulatory  . There were no vitals filed for this visit.  There were no vitals filed for this visit.  .There is no height or weight on file to calculate BMI.  GENERAL:alert, in no acute distress and comfortable SKIN: no acute rashes, no significant lesions EYES: conjunctiva are pink and non-injected, sclera anicteric OROPHARYNX: MMM, no exudates, no oropharyngeal erythema or ulceration NECK: supple, no JVD LYMPH:  no palpable lymphadenopathy in the cervical, axillary or inguinal regions LUNGS: clear to auscultation b/l with normal respiratory effort HEART: regular rate & rhythm ABDOMEN:  normoactive bowel sounds , non tender, not distended. Extremity: no pedal edema PSYCH: alert & oriented x 3 with fluent speech NEURO: no focal motor/sensory deficits  LABORATORY DATA:  I have reviewed the data as listed .    Latest Ref Rng & Units 02/25/2023    8:44 AM  07/22/2022   10:12 AM 04/28/2022   11:53 AM  CBC  WBC 4.0 - 10.5 K/uL 7.5  6.1  6.9   Hemoglobin 12.0 - 15.0 g/dL 29.5  28.4  13.2   Hematocrit 36.0 - 46.0 % 36.8  35.9  33.6   Platelets 150 - 400 K/uL 245  232  205    .    Latest Ref Rng & Units 02/25/2023    8:44 AM 07/22/2022   10:12 AM 04/28/2022   11:53 AM  CMP  Glucose 70 - 99 mg/dL  92  92  90   BUN 8 - 23 mg/dL 13  12  13    Creatinine 0.44 - 1.00 mg/dL 4.03  4.74  2.59   Sodium 135 - 145 mmol/L 141  136  138   Potassium 3.5 - 5.1 mmol/L 4.2  4.0  3.7   Chloride 98 - 111 mmol/L 107  102  105   CO2 22 - 32 mmol/L 27  27  26    Calcium 8.9 - 10.3 mg/dL 9.6  9.0  9.1   Total Protein 6.5 - 8.1 g/dL 6.9  6.7  6.4   Total Bilirubin 0.3 - 1.2 mg/dL 1.2  1.1  1.0   Alkaline Phos 38 - 126 U/L 76  58  62   AST 15 - 41 U/L 41  26  24   ALT 0 - 44 U/L 32  15  13      RADIOGRAPHIC STUDIES: I have personally reviewed the radiological images as listed and agreed with the findings in the report. CT CHEST ABDOMEN PELVIS W CONTRAST  Result Date: 08/16/2023 CLINICAL DATA:  High-grade urothelial carcinoma of the bladder. * Tracking Code: BO * EXAM: CT CHEST, ABDOMEN, AND PELVIS WITH CONTRAST TECHNIQUE: Multidetector CT imaging of the chest, abdomen and pelvis was performed following the standard protocol during bolus administration of intravenous contrast. RADIATION DOSE REDUCTION: This exam was performed according to the departmental dose-optimization program which includes automated exposure control, adjustment of the mA and/or kV according to patient size and/or use of iterative reconstruction technique. CONTRAST:  OMNIPAQUE IOHEXOL 300 MG/ML  SOLN COMPARISON:  02/25/2023 FINDINGS: CT CHEST FINDINGS Cardiovascular: Aortic atherosclerosis. Normal heart size, without pericardial effusion. Lad coronary artery calcification. No central pulmonary embolism, on this non-dedicated study. Mediastinum/Nodes: No supraclavicular adenopathy. High right  paratracheal node measures 6 mm on 11/02 versus 4 mm on the prior. AP window node measures 8 mm in 23/2 and is new since 02/25/2023. No hilar adenopathy. Lungs/Pleura: No pleural fluid. Biapical pleuroparenchymal scarring. Similar anterior left lower lobe scarring. Musculoskeletal: Left shoulder arthroplasty. CT ABDOMEN PELVIS FINDINGS Hepatobiliary: Normal liver. Normal gallbladder, without biliary ductal dilatation. Pancreas: Normal, without mass or ductal dilatation. Spleen: Normal in size, without focal abnormality. Adrenals/Urinary Tract: Normal adrenal glands. Normal kidneys, without hydronephrosis. Cystectomy. Right lower quadrant ileal conduit. No collecting system or ureteric filling defect on delayed images. Stomach/Bowel: Proximal gastric underdistention. Pelvic floor laxity with sigmoid descent including on 122/2. Colonic stool burden suggests constipation. Normal terminal ileum and appendix. Small bowel and the ileal conduit are partially positioned within an area of mild ventral abdominopelvic wall laxity including on 80/2. Vascular/Lymphatic: Advanced aortic and branch vessel atherosclerosis. No abdominopelvic adenopathy. Reproductive: Hysterectomy.  No adnexal mass. Other: No significant free fluid.  No free intraperitoneal air. Musculoskeletal: Trace L4-5 anterolisthesis. L5-S1 trace retrolisthesis. Degenerative disc disease at L5-S1. IMPRESSION: 1. Status post cystectomy and ileal conduit, without acute process or metastatic disease in the abdomen or pelvis. 2. Enlargement of mediastinal nodes which are not pathologic by size criteria. Most likely benign/reactive. Consider chest CT follow-up at 6 months to confirm stability or resolution. A more aggressive approach would include PET. 3. Incidental findings, including: Coronary artery atherosclerosis. Aortic Atherosclerosis (ICD10-I70.0). Possible constipation. Electronically Signed   By: Jeronimo Greaves M.D.   On: 08/16/2023 11:56    ASSESSMENT &  PLAN:   Wonderful 72 y.o. female with:  1.  High-grade urothelial carcinoma of the urethra diagnosed 2021.  She was found to have T4N1 tumor  without any evidence of metastatic disease.    PLAN: -No clinical sign or evidence of recurrent urothelial carcinoma at this time  -Discussed lab results from today, 08/15/2023, in detail with the patient. CBC and CMP are stable.  -CT Chest Abdomen Pelvis results from 08/09/2023 is pending. Will call patient regarding CT results.  CT CAP read noted  Status post cystectomy and ileal conduit, without acute process or metastatic disease in the abdomen or pelvis. . Enlargement of mediastinal nodes which are not pathologic by size criteria. Most likely benign/reactive. Consider chest CT follow-up at 6 months to confirm stability or resolution FOLLOW-UP: CT chest /abd/pelvis in 16 weeks RTC with Dr Candise Che with labs in 18 weeks  The total time spent in the appointment was 25 minutes* .  All of the patient's questions were answered with apparent satisfaction. The patient knows to call the clinic with any problems, questions or concerns.   Wyvonnia Lora MD MS AAHIVMS [facility] Hematology/Oncology Physician [facility]  .*Total Encounter Time as defined by the Centers for Medicare and Medicaid Services includes, in addition to the face-to-face time of a patient visit (documented in the note above) non-face-to-face time: obtaining and reviewing outside history, ordering and reviewing medications, tests or procedures, care coordination (communications with other health care professionals or caregivers) and documentation in the medical record.   I,Param Shah,acting as a Neurosurgeon for Wyvonnia Lora, MD.,have documented all relevant documentation on the behalf of Wyvonnia Lora, MD,as directed by  Wyvonnia Lora, MD while in the presence of Wyvonnia Lora, MD.  .I have reviewed the above documentation for accuracy and completeness, and I agree with the above. Johney Maine MD

## 2023-08-22 ENCOUNTER — Telehealth: Payer: Self-pay | Admitting: Hematology

## 2023-08-22 ENCOUNTER — Encounter: Payer: Self-pay | Admitting: Hematology

## 2023-08-22 NOTE — Telephone Encounter (Signed)
 Left patient a vm regarding upcoming appointment

## 2023-08-24 NOTE — Progress Notes (Signed)
Contacted pt per Dr Candise Che to: let patient know CT Chest/abd/pelvis with no overt new metastatic disease. Few borderline mediastinal LNs.Marland Kitchenthought to be reactive but will need to closely monitor. Rpt scans in 4 months instead of 6 months.  Pt acknowledged information and verbalized understanding.

## 2023-11-17 ENCOUNTER — Encounter: Payer: Self-pay | Admitting: Obstetrics and Gynecology

## 2023-11-17 DIAGNOSIS — M810 Age-related osteoporosis without current pathological fracture: Secondary | ICD-10-CM

## 2023-11-17 DIAGNOSIS — Z0000 Encounter for general adult medical examination without abnormal findings: Secondary | ICD-10-CM

## 2023-11-18 ENCOUNTER — Other Ambulatory Visit: Payer: Self-pay | Admitting: Obstetrics and Gynecology

## 2023-11-18 DIAGNOSIS — M810 Age-related osteoporosis without current pathological fracture: Secondary | ICD-10-CM

## 2023-11-18 DIAGNOSIS — Z0000 Encounter for general adult medical examination without abnormal findings: Secondary | ICD-10-CM

## 2023-11-21 ENCOUNTER — Other Ambulatory Visit: Payer: Medicare Other

## 2023-12-12 ENCOUNTER — Encounter (HOSPITAL_COMMUNITY): Payer: Self-pay

## 2023-12-12 ENCOUNTER — Ambulatory Visit (HOSPITAL_COMMUNITY)
Admission: RE | Admit: 2023-12-12 | Discharge: 2023-12-12 | Disposition: A | Discharge status: Home / Self Care | Payer: Medicare Other | Source: Ambulatory Visit | Attending: Hematology | Admitting: Hematology

## 2023-12-12 ENCOUNTER — Other Ambulatory Visit: Payer: Self-pay

## 2023-12-12 DIAGNOSIS — C680 Malignant neoplasm of urethra: Secondary | ICD-10-CM | POA: Diagnosis present

## 2023-12-12 MED ORDER — VALACYCLOVIR HCL 500 MG PO TABS: 500.0000 mg | ORAL_TABLET | Freq: Every day | ORAL | 1 refills | Status: DC

## 2023-12-12 MED ORDER — IOHEXOL 300 MG/ML  SOLN
100.0000 mL | Freq: Once | INTRAMUSCULAR | Status: AC | PRN
  Administered 2023-12-12: 100 mL via INTRAVENOUS

## 2023-12-23 ENCOUNTER — Other Ambulatory Visit: Payer: Self-pay

## 2023-12-23 DIAGNOSIS — C680 Malignant neoplasm of urethra: Secondary | ICD-10-CM

## 2023-12-26 ENCOUNTER — Inpatient Hospital Stay: Payer: Medicare Other | Attending: Hematology | Admitting: Hematology

## 2023-12-26 ENCOUNTER — Inpatient Hospital Stay: Payer: Medicare Other

## 2023-12-26 VITALS — BP 147/56 | HR 80 | Temp 97.8°F | Resp 18 | Wt 129.1 lb

## 2023-12-26 DIAGNOSIS — M069 Rheumatoid arthritis, unspecified: Secondary | ICD-10-CM | POA: Insufficient documentation

## 2023-12-26 DIAGNOSIS — K5120 Ulcerative (chronic) proctitis without complications: Secondary | ICD-10-CM | POA: Diagnosis not present

## 2023-12-26 DIAGNOSIS — Z8559 Personal history of malignant neoplasm of other urinary tract organ: Secondary | ICD-10-CM | POA: Diagnosis present

## 2023-12-26 DIAGNOSIS — C680 Malignant neoplasm of urethra: Secondary | ICD-10-CM | POA: Diagnosis not present

## 2023-12-26 DIAGNOSIS — Z79899 Other long term (current) drug therapy: Secondary | ICD-10-CM | POA: Insufficient documentation

## 2023-12-26 DIAGNOSIS — Z87891 Personal history of nicotine dependence: Secondary | ICD-10-CM | POA: Insufficient documentation

## 2023-12-26 LAB — CBC WITH DIFFERENTIAL (CANCER CENTER ONLY)
Abs Immature Granulocytes: 0.02 10*3/uL (ref 0.00–0.07)
Basophils Absolute: 0 10*3/uL (ref 0.0–0.1)
Basophils Relative: 1 %
Eosinophils Absolute: 0 10*3/uL (ref 0.0–0.5)
Eosinophils Relative: 1 %
HCT: 39.8 % (ref 36.0–46.0)
Hemoglobin: 13.8 g/dL (ref 12.0–15.0)
Immature Granulocytes: 0 %
Lymphocytes Relative: 19 %
Lymphs Abs: 1.4 10*3/uL (ref 0.7–4.0)
MCH: 33.4 pg (ref 26.0–34.0)
MCHC: 34.7 g/dL (ref 30.0–36.0)
MCV: 96.4 fL (ref 80.0–100.0)
Monocytes Absolute: 0.9 10*3/uL (ref 0.1–1.0)
Monocytes Relative: 12 %
Neutro Abs: 5.1 10*3/uL (ref 1.7–7.7)
Neutrophils Relative %: 67 %
Platelet Count: 245 10*3/uL (ref 150–400)
RBC: 4.13 MIL/uL (ref 3.87–5.11)
RDW: 13.2 % (ref 11.5–15.5)
WBC Count: 7.6 10*3/uL (ref 4.0–10.5)
nRBC: 0 % (ref 0.0–0.2)

## 2023-12-26 LAB — CMP (CANCER CENTER ONLY)
ALT: 17 U/L (ref 0–44)
AST: 25 U/L (ref 15–41)
Albumin: 4.6 g/dL (ref 3.5–5.0)
Alkaline Phosphatase: 80 U/L (ref 38–126)
Anion gap: 8 (ref 5–15)
BUN: 11 mg/dL (ref 8–23)
CO2: 27 mmol/L (ref 22–32)
Calcium: 9.7 mg/dL (ref 8.9–10.3)
Chloride: 101 mmol/L (ref 98–111)
Creatinine: 0.79 mg/dL (ref 0.44–1.00)
GFR, Estimated: >60 mL/min (ref >60)
Glucose, Bld: 100 mg/dL — ABNORMAL HIGH (ref 70–99)
Potassium: 4.2 mmol/L (ref 3.5–5.1)
Sodium: 136 mmol/L (ref 135–145)
Total Bilirubin: 0.9 mg/dL (ref 0.0–1.2)
Total Protein: 7.4 g/dL (ref 6.5–8.1)

## 2023-12-26 LAB — TSH: TSH: 1.688 u[IU]/mL (ref 0.350–4.500)

## 2023-12-26 MED ORDER — VALACYCLOVIR HCL 500 MG PO TABS: 500.0000 mg | ORAL_TABLET | Freq: Two times a day (BID) | ORAL | 3 refills | Status: AC

## 2023-12-26 NOTE — Progress Notes (Signed)
 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 Neoadjuvant chemotherapy utilizing gemcitabine and cisplatin started on October 29, 2020.  She completed 4 cycles of therapy on January 07, 2021.   She is status post robotic assisted laparoscopic cystectomy, hysterectomy and lymph node dissection completed on Mar 04, 2021 completed by Dr. Kathrynn Running.  He final pathology showed T4N1 disease.  She had 1 out of 16 lymph node involvement.     She is status post Nivolumab 480 mg every 4 weeks started in July 2022.  She completed 12 months of therapy in June 2023.   Current therapy: Active surveillance.  HISTORY OF PRESENTING ILLNESS:  Sandra Mann is a wonderful 73 y.o. female who has been a previous patient of Dr. Clelia Croft and has transferred her care to [place]. 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  Today, she is accompanied by her husband. She reports that she has been feeling well overall over the last 6 months. She denies any toxicities from her treatment.  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 She has endorsed these auto-immune conditions for about 15-20 years. Neither of these chronic diseases flared with immunotherapy.   She reports occasional joint swelling in her hands, but has not had a flare-up for a while. She reports that her Methotrexate dose  was previously lowered from 8 tablets to 6 in the past, which resulted in  issues with rheumatoid arthritis. The issues resolved once the dose was increased again. She does take Tylenol for pain management.  Patient denies any urinary infections, nutritional deficiencies, bleeding issues, diarrhea, abdominal pain, or leg swelling, and reports normal p.o. intake. Patient regularly takes Women 50+ vitamin supplements as well as folic acid.   INTERVAL HISTORY:  Sandra Mann is a wonderful 73 y.o. 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   Patient was last seen by me on 08/15/2023 and she was doing well overall.   Patient is accompanied by her husband during this visit. Patient notes she has bene doing well overall since our last visit. She denies any new infection issues, fever, chills, night sweats, unexpected weight loss, back pain, chest pain, or leg swelling.   She does complain of mild constipation, which is improving with metamucil. She also complains of mild mouth sores after eating certain foods, such as tomatoes, salsa, and ketchup.   Patient regularly follows-up with her Urologist.   Patient notes she occasionally takes Valtrex 500 mg.   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   Oral mass    left   Osteopenia    PONV (postoperative nausea and vomiting)    Rheumatoid arteritis (HCC)    Rheumatoid arthritis (HCC)    Ulcerative proctitis (HCC)    Ureteral cancer (HCC) dx'd 09/2020   Wears glasses  SURGICAL HISTORY: Past Surgical History:  Procedure Laterality Date   COLONOSCOPY     CYSTOSCOPY WITH INJECTION N/A 03/04/2021   Procedure: CYSTOSCOPY WITH INJECTION OF INDOCYANINE GREEN DYE;  Surgeon: Sebastian Ache, MD;  Location: [facility];  Service: Urology;  Laterality: N/A;   CYSTOSCOPY WITH URETHRAL DILATATION N/A 09/23/2020   Procedure:  CYSTOSCOPY WITH URETHRAL DILATATION, EXAM UNDER ANESTHESIA, BIOPSY OF URETHRAL MASS;  Surgeon: Noel Christmas, MD;  Location: [facility];  Service: Urology;  Laterality: N/A;   EXCISION ORAL TUMOR Left 03/27/2014   Procedure: EXCISION OF ORAL CANCER;  Surgeon: Serena Colonel, MD;  Location: [facility];  Service: ENT;  Laterality: Left;   FINGER GANGLION CYST EXCISION Right 2007   small dip   INCISION AND DRAINAGE ABSCESS Left 06/12/2014   Procedure: LEFT INCISION AND DRAINAGE MANDIBULAR ABSCESS;  Surgeon: Serena Colonel, MD;  Location: [facility];  Service: ENT;  Laterality: Left;   INCISION AND DRAINAGE ABSCESS Left 11/22/2014   Procedure: INCISION AND DRAINAGE LEFT  MANDIBULAR ABSCESS;  Surgeon: Serena Colonel, MD;  Location: [facility];  Service: ENT;  Laterality: Left;   INCISION AND DRAINAGE OF PERITONSILLAR ABCESS Left 06/01/2014   Procedure: INCISION AND DRAINAGE Masticator Space Infection;  Surgeon: Serena Colonel, MD;  Location: [facility];  Service: ENT;  Laterality: Left;   IR IMAGING GUIDED PORT INSERTION  10/21/2020   IR REMOVAL TUN ACCESS W/ PORT W/O FL MOD SED  03/17/2023   LYMPH NODE DISSECTION Bilateral 03/04/2021   Procedure: LYMPH NODE DISSECTION;  Surgeon: Sebastian Ache, MD;  Location: [facility];  Service: Urology;  Laterality: Bilateral;   MULTIPLE EXTRACTIONS WITH ALVEOLOPLASTY Left 03/27/2014   Procedure: EXTRACTIONS OF NECESSARY TOOTH ;  Surgeon: Hinton Dyer, DDS;  Location: [facility];  Service: Oral Surgery;  Laterality: Left;   REVERSE SHOULDER ARTHROPLASTY Left 09/03/2021   Procedure: REVERSE SHOULDER ARTHROPLASTY;  Surgeon: Francena Hanly, MD;  Location: [facility];  Service: Orthopedics;  Laterality: Left;   ROBOT ASSISTED LAPAROSCOPIC COMPLETE CYSTECT ILEAL CONDUIT N/A 03/04/2021   Procedure: XI ROBOTIC ASSISTED LAPAROSCOPIC COMPLETE CYSTECT ILEAL CONDUIT;  Surgeon: Sebastian Ache, MD;  Location: [facility];  Service: Urology;  Laterality: N/A;  6.5 HRS   ROBOTIC ASSISTED  LAPAROSCOPIC HYSTERECTOMY AND SALPINGECTOMY Bilateral 03/04/2021   Procedure: XI ROBOTIC ASSISTED LAPAROSCOPIC HYSTERECTOMY AND SALPINGECTOMY;  Surgeon: Sebastian Ache, MD;  Location: [facility];  Service: Urology;  Laterality: Bilateral;   TUBAL LIGATION  ~ 1988    SOCIAL HISTORY: Social History   Socioeconomic History   Marital status: Married    Spouse name: Not on file   Number of children: Not on file   Years of education: Not on file   Highest education level: Not on file  Occupational History   Not on file  Tobacco Use   Smoking status: Former    Current packs/day: 0.00    Average packs/day: 1 pack/day for 30.0 years (30.0 ttl pk-yrs)    Types: Cigarettes    Start date: 03/20/1970    Quit date: 03/20/2000    Years since quitting: 23.7   Smokeless tobacco: Never  Vaping Use   Vaping status: Never Used  Substance and Sexual Activity   Alcohol use: Yes    Comment: 05/28/2014 "drink a glass of wine ~ q night; none for the last week"   Drug use: No   Sexual activity: Not Currently    Birth control/protection: Post-menopausal  Other Topics Concern   Not on file  Social History Narrative   Not on file   Social Drivers of Health   Financial Resource Strain: Not on file  Food Insecurity: Low Risk  (09/07/2023)   Received from [facility]   Hunger Vital Sign    Worried About Running Out of Food in the Last Year: Never true    Ran Out of Food in the Last Year: Never true  Transportation Needs: No Transportation Needs (09/07/2023)   Received from [facility]    In the past 12 months, has lack of reliable transportation kept you from medical appointments, meetings, work or from getting things needed for daily living? : No  Physical Activity: Not on file  Stress: Not on file  Social Connections: Not on file  Intimate Partner Violence: Not on file    FAMILY HISTORY: Family History  Problem Relation Age of Onset   Heart attack Mother    Diabetes Maternal  Grandmother     ALLERGIES:  is allergic to codeine, sulfa antibiotics, vicodin [hydrocodone-acetaminophen], other, and sulfur.  MEDICATIONS:  Current Outpatient Medications  Medication Sig Dispense Refill   cycloSPORINE (RESTASIS) 0.05 % ophthalmic emulsion Place 1 drop into both eyes 2 (two) times daily.     folic acid (FOLVITE) 800 MCG tablet Take 1,600 mcg by mouth daily.      lidocaine-prilocaine (EMLA) cream Apply 1 application. topically as needed. 30 g 3   Melatonin 10 MG TBCR Take 10 mg by mouth See admin instructions.     mesalamine (CANASA) 1000 MG suppository Place 1,000 mg rectally every other day. At bedtime     methotrexate (RHEUMATREX) 2.5 MG tablet Take 20 mg by mouth every Tuesday.     Multiple Vitamin (MULTIVITAMIN WITH MINERALS) TABS tablet Take 1 tablet by mouth at bedtime. Silver     omeprazole (PRILOSEC) 40 MG capsule Take 40 mg by mouth daily.     valACYclovir (VALTREX) 500 MG tablet Take 1 tablet (500 mg total) by mouth daily. 14 tablet 1   No current facility-administered medications for this visit.    REVIEW OF SYSTEMS:    10 Point review of Systems was done is negative except as noted above.  PHYSICAL EXAMINATION: ECOG PERFORMANCE STATUS: 1 - Symptomatic but completely ambulatory  . Vitals:   12/26/23 1236  BP: (!) 147/56  Pulse: 80  Resp: 18  Temp: 97.8 F (36.6 C)  SpO2: 100%    Filed Weights   12/26/23 1236  Weight: 129 lb 1.6 oz (58.6 kg)    .Body mass index is 22.51 kg/m.  GENERAL:alert, in no acute distress and comfortable SKIN: no acute rashes, no significant lesions EYES: conjunctiva are pink and non-injected, sclera anicteric OROPHARYNX: MMM, no exudates, no oropharyngeal erythema or ulceration NECK: supple, no JVD LYMPH:  no palpable lymphadenopathy in the cervical, axillary or inguinal regions LUNGS: clear to auscultation b/l with normal respiratory effort HEART: regular rate & rhythm ABDOMEN:  normoactive bowel sounds , non  tender, not distended. Extremity: no pedal edema PSYCH: alert & oriented x 3 with fluent speech NEURO: no focal motor/sensory deficits  LABORATORY DATA:  I have reviewed the data as listed .    Latest Ref Rng & Units 12/26/2023   12:02 PM 08/15/2023    9:40 AM 02/25/2023    8:44 AM  CBC  WBC 4.0 - 10.5 K/uL 7.6  8.1  7.5   Hemoglobin 12.0 - 15.0 g/dL 13.0  86.5  78.4   Hematocrit 36.0 - 46.0 %  39.8  37.8  36.8   Platelets 150 - 400 K/uL 245  279  245    .    Latest Ref Rng & Units 12/26/2023   12:02 PM 08/15/2023    9:40 AM 02/25/2023    8:44 AM  CMP  Glucose 70 - 99 mg/dL 161  096  92   BUN 8 - 23 mg/dL 11  9  13    Creatinine 0.44 - 1.00 mg/dL 0.45  4.09  8.11   Sodium 135 - 145 mmol/L 136  139  141   Potassium 3.5 - 5.1 mmol/L 4.2  4.2  4.2   Chloride 98 - 111 mmol/L 101  105  107   CO2 22 - 32 mmol/L 27  25  27    Calcium 8.9 - 10.3 mg/dL 9.7  9.4  9.6   Total Protein 6.5 - 8.1 g/dL 7.4  7.2  6.9   Total Bilirubin 0.0 - 1.2 mg/dL 0.9  1.0  1.2   Alkaline Phos 38 - 126 U/L 80  82  76   AST 15 - 41 U/L 25  23  41   ALT 0 - 44 U/L 17  14  32      RADIOGRAPHIC STUDIES: I have personally reviewed the radiological images as listed and agreed with the findings in the report. CT CHEST ABDOMEN PELVIS W CONTRAST Result Date: 12/12/2023 CLINICAL DATA:  History of high-grade urothelial carcinoma of bladder, follow-up. * Tracking Code: BO * EXAM: CT CHEST, ABDOMEN, AND PELVIS WITH CONTRAST TECHNIQUE: Multidetector CT imaging of the chest, abdomen and pelvis was performed following the standard protocol during bolus administration of intravenous contrast. RADIATION DOSE REDUCTION: This exam was performed according to the departmental dose-optimization program which includes automated exposure control, adjustment of the mA and/or kV according to patient size and/or use of iterative reconstruction technique. CONTRAST:  OMNIPAQUE IOHEXOL 300 MG/ML  SOLN COMPARISON:  Multiple priors  including CT August 09, 2023 FINDINGS: CT CHEST FINDINGS Cardiovascular: Aortic atherosclerosis. Coronary artery calcifications. Mild cardiac enlargement. Mediastinum/Nodes: No supraclavicular adenopathy. No suspicious thyroid nodule. High right paratracheal lymph node measures 4 mm on image 17/2 previously 6 mm. AP window lymph node measures 6 mm on image 30/2 previously 8 mm. Symmetric esophageal wall thickening. Lungs/Pleura: Biapical pleuroparenchymal scarring. Similar anterior left lower lobe scarring. No new suspicious pulmonary nodules or masses. No pleural effusion. No pneumothorax. Musculoskeletal: No aggressive lytic or blastic lesion of bone. Left shoulder arthroplasty. CT ABDOMEN PELVIS FINDINGS Hepatobiliary: No suspicious hepatic lesion. Gallbladder is unremarkable. No biliary ductal dilation. Pancreas: No pancreatic ductal dilation or evidence of acute inflammation. Spleen: No splenomegaly or focal splenic lesion. Adrenals/Urinary Tract: Bilateral adrenal glands appear normal. No hydronephrosis. Kidneys demonstrate symmetric enhancement. Prior cystectomy with right lower quadrant ileal conduit formation. Stomach/Bowel: Stomach is unremarkable for degree of distension. No pathologic dilation of small or large bowel. Enterotomy sutures in the anterior abdomen. No pathologic dilation of small or large bowel. No evidence of acute bowel inflammation. Vascular/Lymphatic: Aortic atherosclerosis. Normal caliber abdominal aorta. Smooth IVC contours. The portal, splenic and superior mesenteric veins are patent. No pathologically enlarged abdominal or pelvic lymph nodes. Prominent right inguinal lymph nodes measure up to 9 mm in short axis on image 119/series 2, unchanged. Reproductive: Status post hysterectomy. No adnexal masses. Other: No significant abdominopelvic free fluid. Musculoskeletal: No aggressive lytic or blastic lesion of bone. Diffuse demineralization of bone. Multilevel degenerative change of the  spine. IMPRESSION: 1. Prior cystectomy with right lower  quadrant ileal conduit formation. No convincing evidence of local recurrence or metastatic disease in the chest, abdomen or pelvis. 2. Prominent right inguinal lymph nodes measure up to 9 mm in short axis, unchanged. 3. Interval decrease in size of the mediastinal lymph nodes which were enlarging on prior examination. 4.  Aortic Atherosclerosis (ICD10-I70.0). Electronically Signed   By: Maudry Mayhew M.D.   On: 12/12/2023 16:50    ASSESSMENT & PLAN:   Wonderful 73 y.o. female with:  1.  High-grade urothelial carcinoma of the urethra diagnosed 2021.  She was found to have T4N1 tumor without any evidence of metastatic disease.    PLAN: -Discussed lab results from today, 12/26/2023, in detail with the patient. CBC stable. CMP stable.  -Discussed CT chest abdomen pelvis results from 12/12/2023 in detail with the patient. Showed Prior cystectomy with right lower quadrant ileal conduit formation. No convincing evidence of local recurrence or metastatic disease in the chest, abdomen or pelvis. 2. Prominent right inguinal lymph nodes measure up to 9 mm in short axis, unchanged. 3. Interval decrease in size of the mediastinal lymph nodes which were enlarging on prior examination. -Answered all of patient's questions in detail.   -No clinical sign or evidence of recurrent urothelial carcinoma at this time  -Will refill Valtrex 500 mg prn, 30 tablets. -Recommend B-complex three times a week. 1 tablet three times a week.  -Discussed with the patient that Prilosec and Methotrexate can cause Vitamin-B deficiency.  -Discussed with the patient that we will plan for CT scan in 30-months.   FOLLOW-UP: CT chest /abd/pelvis in 22 weeks RTC with Dr Candise Che with labs in 24 weeks   The total time spent in the appointment was 30 minutes* .  All of the patient's questions were answered with apparent satisfaction. The patient knows to call the clinic with any  problems, questions or concerns.   Wyvonnia Lora MD MS AAHIVMS [facility] Hematology/Oncology Physician [facility]  .*Total Encounter Time as defined by the Centers for Medicare and Medicaid Services includes, in addition to the face-to-face time of a patient visit (documented in the note above) non-face-to-face time: obtaining and reviewing outside history, ordering and reviewing medications, tests or procedures, care coordination (communications with other health care professionals or caregivers) and documentation in the medical record.   I,Param Shah,acting as a Neurosurgeon for Wyvonnia Lora, MD.,have documented all relevant documentation on the behalf of Wyvonnia Lora, MD,as directed by  Wyvonnia Lora, MD while in the presence of Wyvonnia Lora, MD.  .I have reviewed the above documentation for accuracy and completeness, and I agree with the above. Johney Maine MD

## 2023-12-27 ENCOUNTER — Telehealth: Payer: Self-pay | Admitting: Hematology

## 2023-12-27 NOTE — Telephone Encounter (Signed)
 Spoke with patient confirming upcoming appointment

## 2024-01-02 ENCOUNTER — Encounter: Payer: Self-pay | Admitting: Hematology

## 2024-03-07 ENCOUNTER — Ambulatory Visit: Admitting: Physical Therapy

## 2024-03-07 NOTE — Therapy (Signed)
 Patient is currently under the care of a Chiropractic until 04/28/24 and is having a Neuro consult this afternoon.  Patient to call clinic should she want to start PT after she has completed her Chiropractic sessions.    Macie Baum MPT

## 2024-05-28 ENCOUNTER — Ambulatory Visit (HOSPITAL_COMMUNITY)
Admission: RE | Admit: 2024-05-28 | Discharge: 2024-05-28 | Disposition: A | Discharge status: Home / Self Care | Source: Ambulatory Visit | Attending: Hematology | Admitting: Hematology

## 2024-05-28 DIAGNOSIS — C680 Malignant neoplasm of urethra: Secondary | ICD-10-CM

## 2024-06-06 ENCOUNTER — Ambulatory Visit (HOSPITAL_COMMUNITY)
Admission: RE | Admit: 2024-06-06 | Discharge: 2024-06-06 | Disposition: A | Discharge status: Home / Self Care | Source: Ambulatory Visit | Attending: Hematology | Admitting: Hematology

## 2024-06-06 DIAGNOSIS — C680 Malignant neoplasm of urethra: Secondary | ICD-10-CM | POA: Diagnosis present

## 2024-06-06 MED ORDER — IOHEXOL 300 MG/ML  SOLN
100.0000 mL | Freq: Once | INTRAMUSCULAR | Status: AC | PRN
  Administered 2024-06-06: 100 mL via INTRAVENOUS

## 2024-06-08 ENCOUNTER — Other Ambulatory Visit: Payer: Self-pay

## 2024-06-08 DIAGNOSIS — C680 Malignant neoplasm of urethra: Secondary | ICD-10-CM

## 2024-06-11 ENCOUNTER — Inpatient Hospital Stay: Payer: Medicare Other | Attending: Hematology

## 2024-06-11 ENCOUNTER — Inpatient Hospital Stay: Payer: Medicare Other | Admitting: Hematology

## 2024-06-11 VITALS — BP 122/57 | HR 64 | Temp 97.2°F | Resp 18

## 2024-06-11 DIAGNOSIS — K5120 Ulcerative (chronic) proctitis without complications: Secondary | ICD-10-CM | POA: Diagnosis not present

## 2024-06-11 DIAGNOSIS — Z79899 Other long term (current) drug therapy: Secondary | ICD-10-CM | POA: Insufficient documentation

## 2024-06-11 DIAGNOSIS — Z87891 Personal history of nicotine dependence: Secondary | ICD-10-CM | POA: Insufficient documentation

## 2024-06-11 DIAGNOSIS — Z79631 Long term (current) use of antimetabolite agent: Secondary | ICD-10-CM | POA: Insufficient documentation

## 2024-06-11 DIAGNOSIS — Z8559 Personal history of malignant neoplasm of other urinary tract organ: Secondary | ICD-10-CM | POA: Insufficient documentation

## 2024-06-11 DIAGNOSIS — M069 Rheumatoid arthritis, unspecified: Secondary | ICD-10-CM | POA: Insufficient documentation

## 2024-06-11 DIAGNOSIS — C680 Malignant neoplasm of urethra: Secondary | ICD-10-CM

## 2024-06-11 DIAGNOSIS — Z9221 Personal history of antineoplastic chemotherapy: Secondary | ICD-10-CM | POA: Diagnosis not present

## 2024-06-11 DIAGNOSIS — Z08 Encounter for follow-up examination after completed treatment for malignant neoplasm: Secondary | ICD-10-CM | POA: Insufficient documentation

## 2024-06-11 LAB — CMP (CANCER CENTER ONLY)
ALT: 16 U/L (ref 0–44)
AST: 23 U/L (ref 15–41)
Albumin: 4.5 g/dL (ref 3.5–5.0)
Alkaline Phosphatase: 74 U/L (ref 38–126)
Anion gap: 5 (ref 5–15)
BUN: 17 mg/dL (ref 8–23)
CO2: 29 mmol/L (ref 22–32)
Calcium: 9.3 mg/dL (ref 8.9–10.3)
Chloride: 104 mmol/L (ref 98–111)
Creatinine: 0.84 mg/dL (ref 0.44–1.00)
GFR, Estimated: >60 mL/min (ref >60)
Glucose, Bld: 90 mg/dL (ref 70–99)
Potassium: 4.2 mmol/L (ref 3.5–5.1)
Sodium: 138 mmol/L (ref 135–145)
Total Bilirubin: 0.8 mg/dL (ref 0.0–1.2)
Total Protein: 6.9 g/dL (ref 6.5–8.1)

## 2024-06-11 LAB — CBC WITH DIFFERENTIAL (CANCER CENTER ONLY)
Abs Immature Granulocytes: 0.04 K/uL (ref 0.00–0.07)
Basophils Absolute: 0.1 K/uL (ref 0.0–0.1)
Basophils Relative: 1 %
Eosinophils Absolute: 0.1 K/uL (ref 0.0–0.5)
Eosinophils Relative: 1 %
HCT: 38.2 % (ref 36.0–46.0)
Hemoglobin: 13.5 g/dL (ref 12.0–15.0)
Immature Granulocytes: 1 %
Lymphocytes Relative: 23 %
Lymphs Abs: 1.8 K/uL (ref 0.7–4.0)
MCH: 34.6 pg — ABNORMAL HIGH (ref 26.0–34.0)
MCHC: 35.3 g/dL (ref 30.0–36.0)
MCV: 97.9 fL (ref 80.0–100.0)
Monocytes Absolute: 1 K/uL (ref 0.1–1.0)
Monocytes Relative: 13 %
Neutro Abs: 4.9 K/uL (ref 1.7–7.7)
Neutrophils Relative %: 61 %
Platelet Count: 274 K/uL (ref 150–400)
RBC: 3.9 MIL/uL (ref 3.87–5.11)
RDW: 13.9 % (ref 11.5–15.5)
WBC Count: 7.8 K/uL (ref 4.0–10.5)
nRBC: 0 % (ref 0.0–0.2)

## 2024-06-11 LAB — TSH: TSH: 1.33 u[IU]/mL (ref 0.350–4.500)

## 2024-06-12 ENCOUNTER — Telehealth: Payer: Self-pay | Admitting: Hematology

## 2024-06-12 NOTE — Telephone Encounter (Signed)
 Sandra Mann has been made aware of her 6 month follow up appointment.

## 2024-06-17 NOTE — Progress Notes (Signed)
 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 Neoadjuvant chemotherapy utilizing gemcitabine  and cisplatin  started on October 29, 2020.  She completed 4 cycles of therapy on January 07, 2021.   She is status post robotic assisted laparoscopic cystectomy, hysterectomy and lymph node dissection completed on Mar 04, 2021 completed by Dr. Patrcia.  He final pathology showed T4N1 disease.  She had 1 out of 16 lymph node involvement.    She is status post Nivolumab  480 mg every 4 weeks started in July 2022.  She completed 12 months of therapy in June 2023.   Current therapy: Active surveillance.  HISTORY OF PRESENTING ILLNESS:  Sandra Mann is a wonderful 73 y.o. female who has been a previous patient of Dr. Amadeo and has transferred her care to us . 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  Today, she is accompanied by her husband. She reports that she has been feeling well overall over the last 6 months. She denies any toxicities from her treatment.  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 She has endorsed these auto-immune conditions for about 15-20 years. Neither of these chronic diseases flared with immunotherapy.   She reports occasional joint swelling in her hands, but has not had a flare-up for a while. She reports that her Methotrexate  dose  was previously lowered from 8 tablets to 6 in the past, which resulted in  issues with rheumatoid arthritis. The issues resolved once the dose was increased again. She does take Tylenol  for pain management.  Patient denies any urinary infections, nutritional deficiencies, bleeding issues, diarrhea, abdominal pain, or leg swelling, and reports normal p.o. intake. Patient regularly takes Women 50+ vitamin supplements as well as folic acid .   INTERVAL HISTORY:  Sandra Mann is a wonderful 73 y.o. female who is here for continued  evaluation and management of T4N1 high-grade urothelial carcinoma. Patient is here for her scheduled 70-month follow-up.  She notes no acute new symptoms.  No issues with her ileostomy bag.  No infection issues.  She is now out about 2 years status post completion of adjuvant nivolumab . Recent CT chest abdomen pelvis results were discussed and did not show any evidence of disease progression.  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   Oral mass    left   Osteopenia    PONV (postoperative nausea and vomiting)    Rheumatoid arteritis (HCC)    Rheumatoid arthritis (HCC)    Ulcerative proctitis (HCC)    Ureteral cancer (HCC) dx'd 09/2020   Wears glasses     SURGICAL HISTORY: Past Surgical History:  Procedure Laterality Date   COLONOSCOPY     CYSTOSCOPY WITH INJECTION N/A 03/04/2021   Procedure: CYSTOSCOPY WITH INJECTION OF INDOCYANINE GREEN DYE;  Surgeon: Alvaro Hummer, MD;  Location: [facility];  Service: Urology;  Laterality: N/A;   CYSTOSCOPY WITH URETHRAL DILATATION  N/A 09/23/2020   Procedure: CYSTOSCOPY WITH URETHRAL DILATATION, EXAM UNDER ANESTHESIA, BIOPSY OF URETHRAL MASS;  Surgeon: Elisabeth Valli BIRCH, MD;  Location: [facility];  Service: Urology;  Laterality: N/A;   EXCISION ORAL TUMOR Left 03/27/2014   Procedure: EXCISION OF ORAL CANCER;  Surgeon: Ida Loader, MD;  Location: [facility];   Service: ENT;  Laterality: Left;   FINGER GANGLION CYST EXCISION Right 2007   small dip   INCISION AND DRAINAGE ABSCESS Left 06/12/2014   Procedure: LEFT INCISION AND DRAINAGE MANDIBULAR ABSCESS;  Surgeon: Ida Loader, MD;  Location: [facility];  Service: ENT;  Laterality: Left;   INCISION AND DRAINAGE ABSCESS Left 11/22/2014   Procedure: INCISION AND DRAINAGE LEFT  MANDIBULAR ABSCESS;  Surgeon: Ida Loader, MD;  Location: [facility];  Service: ENT;  Laterality: Left;   INCISION AND DRAINAGE OF PERITONSILLAR ABCESS Left 06/01/2014   Procedure: INCISION AND DRAINAGE Masticator Space Infection;  Surgeon: Ida Loader, MD;  Location: [facility];  Service: ENT;  Laterality: Left;   IR IMAGING GUIDED PORT INSERTION  10/21/2020   IR REMOVAL TUN ACCESS W/ PORT W/O FL MOD SED  03/17/2023   LYMPH NODE DISSECTION Bilateral 03/04/2021   Procedure: LYMPH NODE DISSECTION;  Surgeon: Alvaro Hummer, MD;  Location: [facility];  Service: Urology;  Laterality: Bilateral;   MULTIPLE EXTRACTIONS WITH ALVEOLOPLASTY Left 03/27/2014   Procedure: EXTRACTIONS OF NECESSARY TOOTH ;  Surgeon: Fairy LITTIE Pinal, DDS;  Location: [facility];  Service: Oral Surgery;  Laterality: Left;   REVERSE SHOULDER ARTHROPLASTY Left 09/03/2021   Procedure: REVERSE SHOULDER ARTHROPLASTY;  Surgeon: Melita Drivers, MD;  Location: [facility];  Service: Orthopedics;  Laterality: Left;   ROBOT ASSISTED LAPAROSCOPIC COMPLETE CYSTECT ILEAL CONDUIT N/A 03/04/2021   Procedure: XI ROBOTIC ASSISTED LAPAROSCOPIC COMPLETE CYSTECT ILEAL CONDUIT;  Surgeon: Alvaro Hummer, MD;  Location: [facility];  Service: Urology;  Laterality: N/A;  6.5 HRS   ROBOTIC ASSISTED LAPAROSCOPIC HYSTERECTOMY AND SALPINGECTOMY Bilateral 03/04/2021   Procedure: XI ROBOTIC ASSISTED LAPAROSCOPIC HYSTERECTOMY AND SALPINGECTOMY;  Surgeon: Alvaro Hummer, MD;  Location: [facility];  Service: Urology;  Laterality: Bilateral;   TUBAL LIGATION  ~ 1988    SOCIAL HISTORY: Social History   Socioeconomic History    Marital status: Married    Spouse name: Not on file   Number of children: Not on file   Years of education: Not on file   Highest education level: Not on file  Occupational History   Not on file  Tobacco Use   Smoking status: Former    Current packs/day: 0.00    Average packs/day: 1 pack/day for 30.0 years (30.0 ttl pk-yrs)    Types: Cigarettes    Start date: 03/20/1970    Quit date: 03/20/2000    Years since quitting: 24.2   Smokeless tobacco: Never  Vaping Use   Vaping status: Never Used  Substance and Sexual Activity   Alcohol use: Yes    Comment: 05/28/2014 drink a glass of wine ~ q night; none for the last week   Drug use: No   Sexual activity: Not Currently    Birth control/protection: Post-menopausal  Other Topics Concern   Not on file  Social History Narrative   Not on file   Social Drivers of Health   Financial Resource Strain: Not on file  Food Insecurity: Low Risk  (09/07/2023)   Received from [facility]   Hunger Vital Sign    Within the past 12 months, you worried that your  food would run out before you got money to buy more: Never true    Within the past 12 months, the food you bought just didn't last and you didn't have money to get more. : Never true  Transportation Needs: No Transportation Needs (09/07/2023)   Received from [facility]    In the past 12 months, has lack of reliable transportation kept you from medical appointments, meetings, work or from getting things needed for daily living? : No  Physical Activity: Not on file  Stress: Not on file  Social Connections: Not on file  Intimate Partner Violence: Not on file    FAMILY HISTORY: Family History  Problem Relation Age of Onset   Heart attack Mother    Diabetes Maternal Grandmother     ALLERGIES:  is allergic to codeine, sulfa antibiotics, vicodin [hydrocodone -acetaminophen ], other, and sulfur.  MEDICATIONS:  Current Outpatient Medications  Medication Sig Dispense  Refill   cycloSPORINE  (RESTASIS ) 0.05 % ophthalmic emulsion Place 1 drop into both eyes 2 (two) times daily.     folic acid  (FOLVITE ) 800 MCG tablet Take 1,600 mcg by mouth daily.      lidocaine -prilocaine  (EMLA ) cream Apply 1 application. topically as needed. 30 g 3   Melatonin 10 MG TBCR Take 10 mg by mouth See admin instructions.     mesalamine  (CANASA ) 1000 MG suppository Place 1,000 mg rectally every other day. At bedtime     methotrexate  (RHEUMATREX) 2.5 MG tablet Take 20 mg by mouth every Tuesday.     Multiple Vitamin (MULTIVITAMIN WITH MINERALS) TABS tablet Take 1 tablet by mouth at bedtime. Silver     omeprazole (PRILOSEC) 40 MG capsule Take 40 mg by mouth daily.     valACYclovir  (VALTREX ) 500 MG tablet Take 1 tablet (500 mg total) by mouth 2 (two) times daily. 30 tablet 3   No current facility-administered medications for this visit.    REVIEW OF SYSTEMS:    10 Point review of Systems was done is negative except as noted above. PHYSICAL EXAMINATION: ECOG PERFORMANCE STATUS: 1 - Symptomatic but completely ambulatory  . Vitals:   06/11/24 1224  BP: (!) 122/57  Pulse: 64  Resp: 18  Temp: (!) 97.2 F (36.2 C)  SpO2: 99%    Filed Weights   NAD GENERAL:alert, in no acute distress and comfortable SKIN: no acute rashes, no significant lesions EYES: conjunctiva are pink and non-injected, sclera anicteric OROPHARYNX: MMM, no exudates, no oropharyngeal erythema or ulceration NECK: supple, no JVD LYMPH:  no palpable lymphadenopathy in the cervical, axillary or inguinal regions LUNGS: clear to auscultation b/l with normal respiratory effort HEART: regular rate & rhythm ABDOMEN:  normoactive bowel sounds , non tender, not distended.  Ileostomy bag in situ Extremity: no pedal edema PSYCH: alert & oriented x 3 with fluent speech NEURO: no focal motor/sensory deficits   LABORATORY DATA:  I have reviewed the data as listed .    Latest Ref Rng & Units 06/11/2024   11:59 AM  12/26/2023   12:02 PM 08/15/2023    9:40 AM  CBC  WBC 4.0 - 10.5 K/uL 7.8  7.6  8.1   Hemoglobin 12.0 - 15.0 g/dL 86.4  86.1  86.8   Hematocrit 36.0 - 46.0 % 38.2  39.8  37.8   Platelets 150 - 400 K/uL 274  245  279    .    Latest Ref Rng & Units 06/11/2024   11:59 AM 12/26/2023   12:02 PM 08/15/2023  9:40 AM  CMP  Glucose 70 - 99 mg/dL 90  899  891   BUN 8 - 23 mg/dL 17  11  9    Creatinine 0.44 - 1.00 mg/dL 9.15  9.20  9.22   Sodium 135 - 145 mmol/L 138  136  139   Potassium 3.5 - 5.1 mmol/L 4.2  4.2  4.2   Chloride 98 - 111 mmol/L 104  101  105   CO2 22 - 32 mmol/L 29  27  25    Calcium 8.9 - 10.3 mg/dL 9.3  9.7  9.4   Total Protein 6.5 - 8.1 g/dL 6.9  7.4  7.2   Total Bilirubin 0.0 - 1.2 mg/dL 0.8  0.9  1.0   Alkaline Phos 38 - 126 U/L 74  80  82   AST 15 - 41 U/L 23  25  23    ALT 0 - 44 U/L 16  17  14       RADIOGRAPHIC STUDIES: I have personally reviewed the radiological images as listed and agreed with the findings in the report. CT CHEST ABDOMEN PELVIS W CONTRAST Result Date: 06/08/2024 CLINICAL DATA:  Invasive bladder cancer, assess treatment response. High-grade urothelial carcinoma. * Tracking Code: BO * EXAM: CT CHEST, ABDOMEN, AND PELVIS WITH CONTRAST TECHNIQUE: Multidetector CT imaging of the chest, abdomen and pelvis was performed following the standard protocol during bolus administration of intravenous contrast. RADIATION DOSE REDUCTION: This exam was performed according to the departmental dose-optimization program which includes automated exposure control, adjustment of the mA and/or kV according to patient size and/or use of iterative reconstruction technique. CONTRAST:  OMNIPAQUE  IOHEXOL  300 MG/ML  SOLN COMPARISON:  Multiple priors including CT December 12, 2023 FINDINGS: CT CHEST FINDINGS Cardiovascular: Normal caliber thoracic aorta. Right-sided cardiac enlargement. No significant pericardial effusion/thickening. Mediastinum/Nodes: No suspicious thyroid   nodule. No pathologically enlarged mediastinal, hilar or axillary lymph nodes. Previously indexed high right paratracheal lymph node measures 3 mm in short axis on image 13/2 previously 4 mm AP window lymph node measures 4 mm in short axis on image 24/2 previously 6 mm. Mild symmetric esophageal wall thickening. Lungs/Pleura: Biapical pleuroparenchymal scarring. Scattered atelectasis/scarring. No suspicious pulmonary nodules or masses. Musculoskeletal: No aggressive lytic or blastic lesion of bone. Diffuse demineralization of bone. Degenerative change the right shoulder. Left shoulder arthroplasty. CT ABDOMEN PELVIS FINDINGS Hepatobiliary: No suspicious hepatic lesion. Gallbladder is unremarkable. No biliary ductal dilation. Pancreas: No pancreatic ductal dilation or evidence of acute inflammation. Spleen: No splenomegaly. Adrenals/Urinary Tract: No suspicious adrenal nodule/mass. Kidneys demonstrate symmetric enhancement. Similar prominence of the bilateral collecting systems and proximal ureters Prior cysto prostatectomy with ileal diversion. No new suspicious nodularity in the pelvis. Similar thickening of the peritoneal reflections favored postsurgical change. Stomach/Bowel: Stomach is nondistended. No pathologic dilation of small or large bowel. Surgical change of prior urinary conduit formation. No evidence of bowel obstruction. Vascular/Lymphatic: Normal caliber abdominal aorta. Smooth IVC contours. Prominent right inguinal lymph nodes are stable from prior examination for instance measuring 9 mm in short axis on image 114/2. No pathologically enlarged intra-abdominal or intrapelvic lymph nodes identified. Reproductive: Prostatectomy. Other: No significant abdominopelvic free fluid. Musculoskeletal: No aggressive lytic or blastic lesion of bone. Diffuse demineralization of bone. Multilevel degenerative changes spine. Degenerative change of the bilateral hips. IMPRESSION: 1. Prior cystoprostatectomy with ileal  diversion. No evidence of local recurrence or metastatic disease within the chest, abdomen or pelvis. 2. Stable prominent nonspecific right inguinal lymph nodes. 3. Mild symmetric esophageal wall thickening, which may  reflect esophagitis. Electronically Signed   By: Reyes Holder M.D.   On: 06/08/2024 16:59    ASSESSMENT & PLAN:   Wonderful 73 y.o. female with:  1.  High-grade urothelial carcinoma of the urethra diagnosed 2021.  She was found to have T4N1 tumor without any evidence of metastatic disease.  Completed 1 year of adjuvant nivolumab  in 2023 PLAN: - Discussed lab results from today in details. CBC and CMP within normal limits. CT of the chest abdomen pelvis with contrast on 06/08/2024 results were discussed in detail.  The patient has no evidence of metastatic disease or progression =-No clinical sign or evidence of recurrent urothelial carcinoma at this time  -Will refill Valtrex  500 mg prn, 30 tablets. -Recommend B-complex three times a week. 1 tablet three times a week.  -Discussed with the patient that Prilosec and Methotrexate  can cause Vitamin-B deficiency.  -Discussed with the patient that we will plan for CT scan in 38-months.  Patient was here in 6 months with labs  FOLLOW-UP:  RTC with Dr Onesimo with labs in 24 week The total time spent in the appointment was 21 minutes*.  All of the patient's questions were answered with apparent satisfaction. The patient knows to call the clinic with any problems, questions or concerns.   Emaline Onesimo MD MS AAHIVMS [facility] Hematology/Oncology Physician [facility]  .*Total Encounter Time as defined by the Centers for Medicare and Medicaid Services includes, in addition to the face-to-face time of a patient visit (documented in the note above) non-face-to-face time: obtaining and reviewing outside history, ordering and reviewing medications, tests or procedures, care coordination (communications with other health care  professionals or caregivers) and documentation in the medical record.

## 2024-06-18 ENCOUNTER — Encounter: Payer: Self-pay | Admitting: Hematology

## 2024-07-10 ENCOUNTER — Ambulatory Visit
Admission: RE | Admit: 2024-07-10 | Discharge: 2024-07-10 | Disposition: A | Discharge status: Home / Self Care | Payer: Medicare Other | Source: Ambulatory Visit | Attending: Obstetrics and Gynecology | Admitting: Obstetrics and Gynecology

## 2024-07-10 ENCOUNTER — Other Ambulatory Visit: Payer: Medicare Other

## 2024-07-10 DIAGNOSIS — Z0000 Encounter for general adult medical examination without abnormal findings: Secondary | ICD-10-CM

## 2024-09-07 ENCOUNTER — Ambulatory Visit (HOSPITAL_COMMUNITY)
Admission: RE | Admit: 2024-09-07 | Discharge: 2024-09-07 | Disposition: A | Discharge status: Home / Self Care | Source: Ambulatory Visit | Attending: Hematology | Admitting: Hematology

## 2024-09-07 DIAGNOSIS — K9419 Other complications of enterostomy: Secondary | ICD-10-CM | POA: Insufficient documentation

## 2024-09-07 DIAGNOSIS — L24B3 Irritant contact dermatitis related to fecal or urinary stoma or fistula: Secondary | ICD-10-CM

## 2024-09-07 DIAGNOSIS — L308 Other specified dermatitis: Secondary | ICD-10-CM | POA: Diagnosis not present

## 2024-09-07 DIAGNOSIS — Z79899 Other long term (current) drug therapy: Secondary | ICD-10-CM | POA: Insufficient documentation

## 2024-09-07 DIAGNOSIS — N99528 Other complication of other external stoma of urinary tract: Secondary | ICD-10-CM | POA: Diagnosis not present

## 2024-09-07 DIAGNOSIS — Z79624 Long term (current) use of inhibitors of nucleotide synthesis: Secondary | ICD-10-CM | POA: Insufficient documentation

## 2024-09-07 NOTE — Progress Notes (Signed)
 [facility]   Reason for visit:  RUQ ileal conduit. 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   Oral mass    left   Osteopenia    PONV (postoperative nausea and vomiting)    Rheumatoid arteritis (HCC)    Rheumatoid arthritis (HCC)    Ulcerative proctitis (HCC)    Ureteral cancer (HCC) dx'd 09/2020   Wears glasses    Family History  Problem Relation Age of Onset   Heart attack Mother    Diabetes Maternal Grandmother    Breast cancer Neg Hx    Allergies  Allergen Reactions   Codeine Nausea And Vomiting    Other reaction(s): Unknown   Sulfa Antibiotics Nausea And Vomiting   Vicodin [Hydrocodone -Acetaminophen ] Nausea And Vomiting   Other     Other reaction(s): vomiting   Sulfur     Other reaction(s): Unknown   Current Outpatient Medications  Medication Sig Dispense Refill Last Dose/Taking   cycloSPORINE  (RESTASIS ) 0.05 % ophthalmic emulsion Place 1 drop into both eyes 2 (two) times daily.      folic acid  (FOLVITE ) 800 MCG tablet Take 1,600 mcg by mouth daily.       lidocaine -prilocaine  (EMLA ) cream Apply 1 application. topically as needed. 30 g 3    Melatonin 10 MG TBCR Take 10 mg by mouth See admin instructions.      mesalamine  (CANASA ) 1000 MG suppository Place 1,000 mg rectally every other day. At bedtime      methotrexate  (RHEUMATREX) 2.5 MG tablet Take 20 mg by mouth every Tuesday.      Multiple Vitamin (MULTIVITAMIN WITH MINERALS) TABS tablet Take 1 tablet by mouth at bedtime. Silver      omeprazole (PRILOSEC) 40 MG capsule Take 40 mg by mouth daily.      valACYclovir  (VALTREX ) 500 MG tablet Take 1 tablet (500 mg total) by mouth 2 (two) times daily. 30 tablet 3    No current facility-administered medications for this encounter.   ROS  Review of Systems   Constitutional: Negative.   Eyes:  Positive for visual disturbance.  Respiratory: Negative.    Cardiovascular: Negative.   Gastrointestinal: Negative.   Genitourinary:        Ileal conduit  Psychiatric/Behavioral:  The patient is nervous/anxious.   All other systems reviewed and are negative.  Vital signs:  BP (!) 147/67 (BP Location: Right Arm)   Pulse 64   Temp 97.7 F (36.5 C) (Oral)   Resp 19   SpO2 98%  Exam:  Physical Exam Vitals reviewed.  Constitutional:      Appearance: Normal appearance.  Cardiovascular:     Rate and Rhythm: Normal rate.  Pulmonary:     Effort: Pulmonary effort is normal.  Abdominal:     Palpations: Abdomen is soft.  Musculoskeletal:        General: Normal range of motion.  Skin:    General: Skin is warm and dry.     Findings: Erythema present.     Comments: Peristomal irritation  Neurological:     Mental Status: She is alert and oriented to person, place, and time. Mental status is at baseline.  Psychiatric:        Mood and Affect: Mood normal.        Behavior: Behavior normal.  Stoma type/location:  RUQ ileal conduit  Stomal assessment/size:  1  Peristomal assessment:  Red, weepy skin to perimeter of pouching area.  Will add Flonase for steroidal topical treatment. Switch to 1 piece pouch  Suggest medium belt  Treatment options for stomal/peristomal skin: powder and skin prep  barrier ring  Add ostomy belt  (Adding flonase to irritated skin) Output: clear yellow urine Ostomy pouching: 1pc.  Switch to convex  Education provided:  discussed medical adhesive related skin injury.  Avoid stretching skin really tight when applying new pouch  Belt to add security to pouch, avoid slipping     Impression/dx  Ileal conduit Irritant dermatitis Discussion  Will update orders to [place]  Add medium belt Plan  See back 2 weeks to assess     Visit time: 70 minutes.   Darice Cooley FNP-BC

## 2024-09-07 NOTE — Discharge Instructions (Signed)
 Applying FLonase to irritated skin.  Air dry Then stoma powder and skin prep like you normally do Barrier ring 1piece convex pouch [place] Suggest medium belt

## 2024-09-13 ENCOUNTER — Other Ambulatory Visit (HOSPITAL_COMMUNITY): Payer: Self-pay | Admitting: Nurse Practitioner

## 2024-09-13 DIAGNOSIS — N99528 Other complication of other external stoma of urinary tract: Secondary | ICD-10-CM

## 2024-09-13 DIAGNOSIS — L24B3 Irritant contact dermatitis related to fecal or urinary stoma or fistula: Secondary | ICD-10-CM | POA: Insufficient documentation

## 2024-09-25 NOTE — Telephone Encounter (Signed)
 In an effort that our patients [employer], a Teammate has reviewed your chart and identified that you are due for: Medicare wellness visit.  Care gap(s) not scheduled: MWV    Reason: Not interested at this time.   Source of outreach: Automated call/text.  If you have any questions or need help with scheduling, contact our Health Outreach Team at [phone].

## 2024-10-03 NOTE — Telephone Encounter (Signed)
-----   Message from Elspeth Sella, MD sent at 10/03/2024  7:39 AM EST ----- Please arrange surgery for the scalp SCC in situ. ----- Message ----- From: Lab, Background User Sent: 10/02/2024   5:21 PM EST To: Elspeth JONELLE Sella, MD

## 2024-10-03 NOTE — Telephone Encounter (Signed)
 Telephone Triage Nurse Note Notification of Skin Cancer Biopsy Results  S: Situation Full name and date of birth verified.  Patient: [name] DOB: 1950/12/29  Date: Wed 10/03/2024  Time: 8:48 AM   B: Background Case Report  Surgical Pathology Report                         Case: [id number]                              Authorizing Provider:  Elspeth JONELLE Sella, MD       Collected:           09/24/2024 1629              Ordering Location:     [facility]  Received:            09/24/2024 1629                                     [facility]                                                        Pathologist:           Jon Ronnald Nephew, MD                                                    Specimens:   A) - Skin, scalp                                                                                   B) - Skin, upper lip                                                                               C) - Skin, lower lip                                                                    Final Diagnosis  A. 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 SKIN, UPPER LIP, BIOPSY:              ACTINIC KERATOSIS, INFLAMED.   C. SKIN, LOWER LIP, BIOPSY: POLARIZABLE FOREIGN BODY AND UNDERLYING GRANULATION TISSUE   Electronically signed by Jon Ronnald Nephew, MD on 10/02/2024 at 1721 EST    A: Assessment -Nurse explained the biopsy results to the patient. -Nurse answered questions regarding the results, which were addressed adequately. -Patient verbalized understanding of the findings and expressed no further questions. -Nurse educated patient on wound care instructions for the biopsy site.  R: Recommendation -Nurse routed biopsy results to Dermatology Surgery Scheduling Team to schedule appointment for surgery.  -Advise the patient to contact the office if any new symptoms arise, or if patient has  additional questions.  Nurse Waddell Massie Marking, RN

## 2024-11-14 NOTE — Telephone Encounter (Signed)
 Copied from CRM #[id number]. Topic: Referral - Referrals/Orders [place] >> Nov 14, 2024  2:08 PM Linda PARAS wrote: burnard is calling other request    Include all details related to the request(s) below:  Burnard called from [facility]  need [facility] insurance referral done in the [facility] portal .  Please advise   Confirm and type the Best Contact Number below:  Patient/caller contact number: [phone]            [] Home  [] Mobile  [] Work [x] Other   [] Okay to leave a voicemail   Medication List:  Current Outpatient Medications:    folic acid  (FOLVITE ) 1 mg tablet, Take 1 mg by mouth Once Daily., Disp: , Rfl:    lidocaine -prilocaine  (EMLA ) cream, Apply 1 Application topically as needed., Disp: , Rfl:    mesalamine  (CANASA ) 1,000 mg suppository, mesalamine  1,000 mg rectal suppository, Disp: , Rfl:    methotrexate  2.5 mg tablet, 2.5 mg., Disp: , Rfl:    multivit-min/folic acid /lutein (CENTRUM SILVER ORAL), , Disp: , Rfl:    omeprazole (PriLOSEC) 40 mg DR capsule, Take 40 mg by mouth Once Daily., Disp: , Rfl:    Restasis  0.05 % ophthalmic emulsion, Administer 0.05 drops into each eyes as needed., Disp: , Rfl:    triamcinolone acetonide (KENALOG) 0.1 % ointment, Apply topically 2 (two) times a day as needed (for the lower lip)., Disp: 15 g, Rfl: 1   valACYclovir  (VALTREX ) 1 gram tablet, Take 2 tablets by mouth.  12 hours later take 2 more tablets by mouth, Disp: 8 tablet, Rfl: 0     Medication Request/Refills: Pharmacy Information (if applicable)   [] Not Applicable       [x]  Pharmacy listed  Send Medication Request to:                                                 [] Pharmacy not listed (added to pharmacy list in Epic) Send Medication Request to:      Listed Pharmacies: [facility] [place], [place] - [address] - PHONE: [phone] - FAX: [phone]

## 2024-11-14 NOTE — Telephone Encounter (Signed)
 Referral has been submitted

## 2024-11-15 NOTE — Telephone Encounter (Signed)
 Referral entered and she is aware

## 2024-11-15 NOTE — Telephone Encounter (Signed)
 Patient is requesting a dermatology referral for [facility] insurance purposes. Please advise. Best contact number: [phone].

## 2024-12-12 ENCOUNTER — Inpatient Hospital Stay: Admitting: Hematology

## 2024-12-12 ENCOUNTER — Inpatient Hospital Stay
